# Patient Record
Sex: Male | Born: 1975 | Race: White | Hispanic: No | Marital: Single | Smoking: Current every day smoker
Health system: Southern US, Community
[De-identification: ages and names within clinical notes are randomized; demographics above are authoritative.]

## PROBLEM LIST (undated history)

## (undated) DIAGNOSIS — G5602 Carpal tunnel syndrome, left upper limb: Secondary | ICD-10-CM

## (undated) DIAGNOSIS — R569 Unspecified convulsions: Secondary | ICD-10-CM

## (undated) DIAGNOSIS — G35 Multiple sclerosis: Secondary | ICD-10-CM

## (undated) DIAGNOSIS — F209 Schizophrenia, unspecified: Secondary | ICD-10-CM

## (undated) HISTORY — DX: Schizophrenia, unspecified: F20.9

## (undated) HISTORY — DX: Carpal tunnel syndrome, left upper limb: G56.02

## (undated) HISTORY — PX: OTHER SURGICAL HISTORY: SHX169

## (undated) HISTORY — PX: LUNG BIOPSY: SHX232

## (undated) HISTORY — DX: Unspecified convulsions: R56.9

---

## 1998-08-14 ENCOUNTER — Inpatient Hospital Stay (HOSPITAL_COMMUNITY): Admission: AD | Admit: 1998-08-14 | Discharge: 1998-08-16 | Payer: Self-pay | Admitting: *Deleted

## 2000-03-20 ENCOUNTER — Emergency Department (HOSPITAL_COMMUNITY): Admission: EM | Admit: 2000-03-20 | Discharge: 2000-03-20 | Payer: Self-pay | Admitting: Emergency Medicine

## 2000-04-23 ENCOUNTER — Emergency Department (HOSPITAL_COMMUNITY): Admission: EM | Admit: 2000-04-23 | Discharge: 2000-04-23 | Payer: Self-pay | Admitting: Emergency Medicine

## 2007-10-23 ENCOUNTER — Emergency Department (HOSPITAL_COMMUNITY): Admission: EM | Admit: 2007-10-23 | Discharge: 2007-10-24 | Payer: Self-pay | Admitting: Emergency Medicine

## 2007-11-06 ENCOUNTER — Emergency Department (HOSPITAL_COMMUNITY): Admission: EM | Admit: 2007-11-06 | Discharge: 2007-11-06 | Payer: Self-pay | Admitting: Emergency Medicine

## 2009-12-04 ENCOUNTER — Emergency Department (HOSPITAL_COMMUNITY): Admission: EM | Admit: 2009-12-04 | Discharge: 2009-12-04 | Payer: Self-pay | Admitting: Emergency Medicine

## 2010-10-15 ENCOUNTER — Emergency Department (HOSPITAL_COMMUNITY): Payer: Medicaid Other

## 2010-10-15 ENCOUNTER — Emergency Department (HOSPITAL_COMMUNITY)
Admission: EM | Admit: 2010-10-15 | Discharge: 2010-10-15 | Disposition: A | Discharge status: Home / Self Care | Payer: Medicaid Other | Attending: Emergency Medicine | Admitting: Emergency Medicine

## 2010-10-15 DIAGNOSIS — F17200 Nicotine dependence, unspecified, uncomplicated: Secondary | ICD-10-CM | POA: Insufficient documentation

## 2010-10-15 DIAGNOSIS — R569 Unspecified convulsions: Secondary | ICD-10-CM | POA: Insufficient documentation

## 2010-10-15 LAB — POCT I-STAT, CHEM 8
BUN: 4 mg/dL — ABNORMAL LOW (ref 6–23)
Chloride: 103 mEq/L (ref 96–112)
Creatinine, Ser: 0.8 mg/dL (ref 0.4–1.5)
Hemoglobin: 14.6 g/dL (ref 13.0–17.0)
Potassium: 3.9 mEq/L (ref 3.5–5.1)
Sodium: 139 mEq/L (ref 135–145)

## 2010-12-01 LAB — POCT I-STAT, CHEM 8
BUN: 10 mg/dL (ref 6–23)
Chloride: 105 mEq/L (ref 96–112)
Creatinine, Ser: 0.7 mg/dL (ref 0.4–1.5)
Hemoglobin: 16.3 g/dL (ref 13.0–17.0)
Potassium: 4.1 mEq/L (ref 3.5–5.1)
Sodium: 139 mEq/L (ref 135–145)

## 2011-05-30 LAB — CBC
HCT: 44.8
Hemoglobin: 15.6
MCHC: 34.8
MCHC: 34.9
MCV: 91.6
Platelets: 284
RBC: 4.89
RDW: 12.8

## 2011-05-30 LAB — DIFFERENTIAL
Basophils Absolute: 0.1
Basophils Relative: 1
Basophils Relative: 1
Eosinophils Absolute: 0.2
Eosinophils Relative: 0
Eosinophils Relative: 1
Lymphocytes Relative: 11 — ABNORMAL LOW
Monocytes Absolute: 1
Monocytes Relative: 7
Neutro Abs: 17.1 — ABNORMAL HIGH

## 2011-05-30 LAB — RAPID URINE DRUG SCREEN, HOSP PERFORMED
Amphetamines: NOT DETECTED
Barbiturates: NOT DETECTED
Opiates: NOT DETECTED

## 2011-05-30 LAB — BASIC METABOLIC PANEL
BUN: 13
CO2: 28
Calcium: 9.1
Chloride: 103
Creatinine, Ser: 0.94
GFR calc Af Amer: >60
GFR calc non Af Amer: >60
Glucose, Bld: 114 — ABNORMAL HIGH
Sodium: 137

## 2011-05-30 LAB — URINALYSIS, ROUTINE W REFLEX MICROSCOPIC
Hgb urine dipstick: NEGATIVE
Specific Gravity, Urine: 1.036 — ABNORMAL HIGH
Urobilinogen, UA: 0.2

## 2013-04-15 ENCOUNTER — Ambulatory Visit: Payer: Self-pay | Admitting: Nurse Practitioner

## 2013-05-05 ENCOUNTER — Encounter: Payer: Self-pay | Admitting: Nurse Practitioner

## 2013-05-06 ENCOUNTER — Ambulatory Visit (INDEPENDENT_AMBULATORY_CARE_PROVIDER_SITE_OTHER): Payer: Medicaid Other | Admitting: Nurse Practitioner

## 2013-05-06 ENCOUNTER — Encounter: Payer: Self-pay | Admitting: Nurse Practitioner

## 2013-05-06 VITALS — BP 94/60 | HR 59 | Ht 67.5 in | Wt 166.0 lb

## 2013-05-06 DIAGNOSIS — G40309 Generalized idiopathic epilepsy and epileptic syndromes, not intractable, without status epilepticus: Secondary | ICD-10-CM

## 2013-05-06 DIAGNOSIS — Z5181 Encounter for therapeutic drug level monitoring: Secondary | ICD-10-CM | POA: Insufficient documentation

## 2013-05-06 DIAGNOSIS — Z79899 Other long term (current) drug therapy: Secondary | ICD-10-CM

## 2013-05-06 MED ORDER — LAMOTRIGINE ER 50 MG PO TB24: 50.0000 mg | ORAL_TABLET | Freq: Two times a day (BID) | ORAL | Status: DC

## 2013-05-06 MED ORDER — DIVALPROEX SODIUM 500 MG PO DR TAB: 1500.0000 mg | DELAYED_RELEASE_TABLET | Freq: Every day | ORAL | Status: DC

## 2013-05-06 NOTE — Progress Notes (Signed)
I have read the note, and I agree with the clinical assessment and plan.  Peace Jost KEITH   

## 2013-05-06 NOTE — Progress Notes (Signed)
  Reason for visit followup for seizure disorder HPI: [name], 37 yr old  white male returns for followup with history of seizures. The pt was last seen 09/28/12.   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 The patient is on Depakote in part for psychiatric reasons, but this medication alone did not fully control his seizures. This patient has been on a low dose of Lamictal XR, taking 50 mg twice daily. The patient has tolerated the medication well, and he has had no seizures since being on the medication. The patient denies any other new medical issues that come up since last seen. He currently does not have a primary care   ROS:  14 system review is negative   Medications Current Outpatient Prescriptions on File Prior to Visit  Medication Sig Dispense Refill  . benztropine (COGENTIN) 1 MG tablet Take 1 mg by mouth 2 (two) times daily.      . clonazePAM (KLONOPIN) 1 MG tablet Take 1 mg by mouth at bedtime.      . divalproex (DEPAKOTE) 500 MG DR tablet Take 500 mg by mouth 3 (three) times daily.      . LamoTRIgine (LAMICTAL XR) 50 MG TB24 Take 50 mg by mouth 2 (two) times daily.      . risperiDONE (RISPERDAL) 2 MG tablet Take 2 mg by mouth at bedtime.       No current facility-administered medications on file prior to visit.    Allergies No Known Allergies  Physical Exam General: well developed, well nourished, seated, in no evident distress Head: head normocephalic and atraumatic. Oropharynx benign Neck: supple with no carotid  bruits Cardiovascular: regular rate and rhythm, no murmurs  Neurologic Exam Mental Status: Awake and fully alert. Oriented to place and time. Follows all commands. Speech and language normal. Answers yes and no to  Questions. Affect flat  Cranial Nerves:  Pupils equal, briskly reactive to light. Extraocular movements full without nystagmus. Visual fields full to confrontation. Hearing intact and symmetric to finger snap.  Facial sensation intact. Face, tongue, palate move normally and symmetrically. Neck flexion and extension normal.  Motor: Normal bulk and tone. Normal strength in all tested extremity muscles.No focal weakness Sensory.: intact to touch and pinprick and vibratory.  Coordination: Rapid alternating movements normal in all extremities. Finger-to-nose and heel-to-shin performed accurately bilaterally. Gait and Station: Arises from chair without difficulty. Stance is normal. Gait demonstrates normal stride length and balance . Able to heel, toe and tandem walk without difficulty.  Reflexes: 2+ and symmetric. Toes downgoing.     ASSESSMENT: History of seizure disorder doing well on Depakote and Lamictal     PLAN: Continue Depakote  500 (3) tabs daily Continue Lamictal 50 mg XR  one twice daily Will obtain lab work today Followup yearly and prn. Micheal Webb, GNP-BC APRN

## 2013-05-06 NOTE — Patient Instructions (Addendum)
Continue Depakote which 500 (3) tabs daily Continue Lamictal 50 mg XR  one twice daily Will obtain lab work today Followup yearly and prn.

## 2013-05-08 LAB — COMPREHENSIVE METABOLIC PANEL
Albumin: 4.2 g/dL (ref 3.5–5.5)
BUN/Creatinine Ratio: 8 (ref 8–19)
BUN: 6 mg/dL (ref 6–20)
CO2: 22 mmol/L (ref 18–29)
Calcium: 9.6 mg/dL (ref 8.7–10.2)
Chloride: 104 mmol/L (ref 97–108)
Creatinine, Ser: 0.76 mg/dL (ref 0.76–1.27)
Globulin, Total: 2.3 g/dL (ref 1.5–4.5)
Glucose: 84 mg/dL (ref 65–99)
Total Protein: 6.5 g/dL (ref 6.0–8.5)

## 2013-05-08 LAB — CBC WITH DIFFERENTIAL/PLATELET
Eos: 2 % (ref 0–5)
HCT: 45.3 % (ref 37.5–51.0)
Hemoglobin: 16.1 g/dL (ref 12.6–17.7)
Immature Grans (Abs): 0 10*3/uL (ref 0.0–0.1)
Immature Granulocytes: 0 % (ref 0–2)
Lymphocytes Absolute: 5.1 10*3/uL — ABNORMAL HIGH (ref 0.7–3.1)
Lymphs: 44 % (ref 14–46)
Monocytes: 6 % (ref 4–12)
RDW: 13.5 % (ref 12.3–15.4)
WBC: 11.5 10*3/uL — ABNORMAL HIGH (ref 3.4–10.8)

## 2013-05-08 LAB — LAMOTRIGINE LEVEL: Lamotrigine Lvl: 3.3 ug/mL (ref 2.0–20.0)

## 2013-05-08 LAB — VALPROIC ACID LEVEL: Valproic Acid Lvl: 34 ug/mL — ABNORMAL LOW (ref 50–100)

## 2013-05-10 ENCOUNTER — Other Ambulatory Visit: Payer: Self-pay | Admitting: Nurse Practitioner

## 2013-05-10 DIAGNOSIS — Z79899 Other long term (current) drug therapy: Secondary | ICD-10-CM

## 2013-05-10 MED ORDER — DIVALPROEX SODIUM 500 MG PO DR TAB: 1000.0000 mg | DELAYED_RELEASE_TABLET | Freq: Two times a day (BID) | ORAL | Status: DC

## 2013-05-13 ENCOUNTER — Telehealth: Payer: Self-pay | Admitting: Nurse Practitioner

## 2013-05-13 NOTE — Telephone Encounter (Signed)
Returned call. No answer.  

## 2013-05-17 NOTE — Progress Notes (Signed)
Quick Note:  Left message for patient that Depakote level was low and to increase to 4 tabs daily, per Eber Jones. Also come and have Depakote level drawn in 10 days. Told to call office with any questions. ______

## 2013-05-25 ENCOUNTER — Telehealth: Payer: Self-pay | Admitting: Nurse Practitioner

## 2013-05-25 NOTE — Telephone Encounter (Signed)
Left vm on home phone for pt's mother: Reminder that pt's Depakote levels have to be checked because of increase in medication.  Lab order has already been placed.

## 2013-05-25 NOTE — Telephone Encounter (Signed)
Patients Depakote was increased after last visit. Repeat level needs to be done. Please call Mom to let her know

## 2013-05-31 ENCOUNTER — Other Ambulatory Visit (INDEPENDENT_AMBULATORY_CARE_PROVIDER_SITE_OTHER): Payer: Self-pay

## 2013-05-31 ENCOUNTER — Other Ambulatory Visit: Payer: Self-pay | Admitting: Nurse Practitioner

## 2013-05-31 DIAGNOSIS — Z0289 Encounter for other administrative examinations: Secondary | ICD-10-CM

## 2013-06-01 LAB — VALPROIC ACID LEVEL: Valproic Acid Lvl: 79 ug/mL (ref 50–100)

## 2013-06-02 ENCOUNTER — Telehealth: Payer: Self-pay

## 2013-06-02 NOTE — Telephone Encounter (Signed)
I called and left VM that valproate levels within range. Continue taking same dose.

## 2013-09-15 ENCOUNTER — Telehealth: Payer: Self-pay | Admitting: Neurology

## 2013-09-15 NOTE — Telephone Encounter (Signed)
Pt's mother called stating they just went to refill Pt's LamoTRIgine (LAMICTAL XR) 50 MG TB24 and was told by the Pharmacy that a pre-authorization is required for this medication in order for medicaid to pay for it.  Also stated that form has been previously been sent to [place].  Please contact [name] and if she is unavailable she stated you can leave a message on her voice mail.  Thank you.

## 2014-05-05 ENCOUNTER — Ambulatory Visit (INDEPENDENT_AMBULATORY_CARE_PROVIDER_SITE_OTHER): Payer: Medicaid Other | Admitting: Nurse Practitioner

## 2014-05-05 ENCOUNTER — Encounter: Payer: Self-pay | Admitting: Nurse Practitioner

## 2014-05-05 VITALS — BP 104/70 | HR 64 | Ht 67.5 in | Wt 158.0 lb

## 2014-05-05 DIAGNOSIS — Z79899 Other long term (current) drug therapy: Secondary | ICD-10-CM

## 2014-05-05 DIAGNOSIS — G40309 Generalized idiopathic epilepsy and epileptic syndromes, not intractable, without status epilepticus: Secondary | ICD-10-CM

## 2014-05-05 LAB — CBC WITH DIFFERENTIAL
BASOS ABS: 0 10*3/uL (ref 0.0–0.2)
Basos: 0 %
EOS ABS: 0.2 10*3/uL (ref 0.0–0.4)
Eos: 2 %
HEMATOCRIT: 47.3 % (ref 37.5–51.0)
Hemoglobin: 17 g/dL (ref 12.6–17.7)
LYMPHS ABS: 4.1 10*3/uL — AB (ref 0.7–3.1)
Lymphs: 33 %
MCH: 32.3 pg (ref 26.6–33.0)
MCHC: 35.9 g/dL — AB (ref 31.5–35.7)
MCV: 90 fL (ref 79–97)
MONOCYTES: 6 %
Monocytes Absolute: 0.7 10*3/uL (ref 0.1–0.9)
NEUTROS ABS: 7.5 10*3/uL — AB (ref 1.4–7.0)
Neutrophils Relative %: 59 %
Platelets: 201 10*3/uL (ref 150–379)
RBC: 5.26 x10E6/uL (ref 4.14–5.80)
RDW: 14.1 % (ref 12.3–15.4)
WBC: 12.6 10*3/uL — ABNORMAL HIGH (ref 3.4–10.8)

## 2014-05-05 LAB — COMPREHENSIVE METABOLIC PANEL
A/G RATIO: 1.4 (ref 1.1–2.5)
ALT: 10 IU/L (ref 0–44)
AST: 13 IU/L (ref 0–40)
Albumin: 4.3 g/dL (ref 3.5–5.5)
Alkaline Phosphatase: 71 IU/L (ref 39–117)
BUN/Creatinine Ratio: 8 — ABNORMAL LOW (ref 9–20)
BUN: 6 mg/dL (ref 6–24)
CALCIUM: 9.4 mg/dL (ref 8.7–10.2)
CO2: 26 mmol/L (ref 18–29)
Chloride: 103 mmol/L (ref 96–108)
Creatinine, Ser: 0.77 mg/dL (ref 0.76–1.27)
GFR calc Af Amer: 131 mL/min/{1.73_m2} (ref >59)
GFR, EST NON AFRICAN AMERICAN: 114 mL/min/{1.73_m2} (ref >59)
Globulin, Total: 3 g/dL (ref 1.5–4.5)
Glucose: 75 mg/dL (ref 65–99)
POTASSIUM: 4.2 mmol/L (ref 3.5–5.2)
SODIUM: 139 mmol/L (ref 134–144)
Total Bilirubin: 0.3 mg/dL (ref 0.0–1.2)
Total Protein: 7.3 g/dL (ref 6.0–8.5)

## 2014-05-05 LAB — VALPROIC ACID LEVEL: Valproic Acid Lvl: 48 ug/mL — ABNORMAL LOW (ref 50–100)

## 2014-05-05 NOTE — Patient Instructions (Signed)
Continue Lamictal at current dose, will refill Continue Depakote at current dose will refill Labs today CBC, CMP and VPA level Followup yearly and when necessary

## 2014-05-05 NOTE — Progress Notes (Signed)
[facility]  PATIENT: Micheal Webb DOB: 1976-01-01   REASON FOR VISIT: follow up for seizure  HISTORY OF PRESENT ILLNESS:Micheal Webb, 38 yr old white male returns for followup with history of seizures. The pt was last seen 05/06/13.  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 The patient is on Depakote in part for psychiatric reasons, but this medication alone did not fully control his seizures. This patient has been on a low dose of Lamictal XR, taking 50 mg twice daily. The patient has tolerated the medication well, and he has had no seizures since being on the medication. The patient denies any other new medical issues that come up since last seen. He returns for reevaluation.     REVIEW OF SYSTEMS: Full 14 system review of systems performed and notable only for those listed, all others are neg:  Constitutional: N/A  Cardiovascular: N/A  Ear/Nose/Throat: N/A  Skin: N/A  Eyes: N/A  Respiratory: Occasional wheezing Gastroitestinal: N/A  Hematology/Lymphatic: N/A  Endocrine: N/A Musculoskeletal:N/A  Allergy/Immunology: N/A  Neurological: N/A Psychiatric: N/A Sleep : NA   ALLERGIES: No Known Allergies  HOME MEDICATIONS: Outpatient Prescriptions Prior to Visit  Medication Sig Dispense Refill  . benztropine (COGENTIN) 1 MG tablet Take 1 mg by mouth. 1 TAB IN THE MORNING 2 TABS AT BEDTIME      . clonazePAM (KLONOPIN) 1 MG tablet Take 1 mg by mouth at bedtime.      . LamoTRIgine (LAMICTAL XR) 50 MG TB24 Take 1 tablet (50 mg total) by mouth 2 (two) times daily.  60 tablet  11  . risperiDONE (RISPERDAL) 2 MG tablet Take 2 mg by mouth at bedtime.      . divalproex (DEPAKOTE) 500 MG DR tablet Take 2 tablets (1,000 mg total) by mouth 2 (two) times daily.  120 tablet  11   No facility-administered medications prior to visit.    PAST MEDICAL HISTORY: Past Medical History  Diagnosis Date  . Seizure   . Schizophrenia      PAST SURGICAL HISTORY: Past Surgical History  Procedure Laterality Date  . None      FAMILY HISTORY: Family History  Problem Relation Age of Onset  . Breast cancer Mother   . Colon cancer Paternal Grandfather   . Pancreatic cancer Maternal Grandmother     SOCIAL HISTORY: History   Social History  . Marital Status: Single    Spouse Name: N/A    Number of Children: N/A  . Years of Education: N/A   Occupational History  . Not on file.   Social History Main Topics  . Smoking status: Current Every Day Smoker -- 1.00 packs/day    Types: Cigarettes  . Smokeless tobacco: Never Used  . Alcohol Use: Yes     Comment: occasion  . Drug Use: No     Comment: Denies  . Sexual Activity: Not on file   Other Topics Concern  . Not on file   Social History Narrative   Patient lives alone.    Patient is on disability.   Patient is single.            PHYSICAL EXAM  Filed Vitals:   05/05/14 1429  BP: 104/70  Pulse: 64  Height: 5' 7.5" (1.715 m)  Weight: 158 lb (71.668 kg)   Body mass index is 24.37 kg/(m^2). General: well developed, well nourished, seated, in no evident distress  Head: head normocephalic and atraumatic. Oropharynx benign  Neck: supple with no carotid bruits  Cardiovascular: regular rate and rhythm, no murmurs  Neurologic Exam  Mental Status: Awake and fully alert. Oriented to place and time. Follows all commands. Speech and language normal.  Affect flat  Cranial Nerves: Pupils equal, briskly reactive to light. Extraocular movements full without nystagmus. Visual fields full to confrontation. Hearing intact and symmetric to finger snap. Facial sensation intact. Face, tongue, palate move normally and symmetrically. Neck flexion and extension normal.  Motor: Normal bulk and tone. Normal strength in all tested extremity muscles.No focal weakness  Sensory.: intact to touch and pinprick and vibratory.  Coordination: Rapid alternating movements normal in all  extremities. Finger-to-nose and heel-to-shin performed accurately bilaterally.  Gait and Station: Arises from chair without difficulty. Stance is normal. Gait demonstrates normal stride length and balance . Able to heel, toe and tandem walk without difficulty.  Reflexes: 2+ and symmetric. Toes downgoing.    DIAGNOSTIC DATA (LABS, IMAGING, TESTING) - ASSESSMENT AND PLAN  38 y.o. year old male  has a past medical history of Seizure and Schizophrenia. here to followup. He has not had seizure activity in several years. He is currently on Depakote and Lamictal without side effects  Continue Lamictal at current dose, will refill Continue Depakote at current dose will refill after labs are back Labs today CBC, CMP and VPA level Followup yearly and when necessary [name], [facility], [facility], APRN  [facility] [address] [place], [place] [address] [phone]

## 2014-05-07 NOTE — Progress Notes (Signed)
I have read the note, and I agree with the clinical assessment and plan.  Micheal Webb KEITH   

## 2014-06-13 ENCOUNTER — Other Ambulatory Visit: Payer: Self-pay | Admitting: Nurse Practitioner

## 2014-06-13 NOTE — Telephone Encounter (Signed)
Patient's mother calling to get patient's Lamictal script renewed, please return call and advise, she is hoping to pick up the medication today.

## 2014-07-03 ENCOUNTER — Emergency Department (HOSPITAL_COMMUNITY): Payer: Medicaid Other

## 2014-07-03 ENCOUNTER — Encounter (HOSPITAL_COMMUNITY): Payer: Self-pay | Admitting: Emergency Medicine

## 2014-07-03 ENCOUNTER — Emergency Department (HOSPITAL_COMMUNITY)
Admission: EM | Admit: 2014-07-03 | Discharge: 2014-07-03 | Disposition: A | Discharge status: Home / Self Care | Payer: Medicaid Other | Attending: Emergency Medicine | Admitting: Emergency Medicine

## 2014-07-03 DIAGNOSIS — Z720 Tobacco use: Secondary | ICD-10-CM | POA: Diagnosis not present

## 2014-07-03 DIAGNOSIS — G40909 Epilepsy, unspecified, not intractable, without status epilepticus: Secondary | ICD-10-CM | POA: Insufficient documentation

## 2014-07-03 DIAGNOSIS — Z7952 Long term (current) use of systemic steroids: Secondary | ICD-10-CM | POA: Insufficient documentation

## 2014-07-03 DIAGNOSIS — Z79899 Other long term (current) drug therapy: Secondary | ICD-10-CM | POA: Diagnosis not present

## 2014-07-03 DIAGNOSIS — J209 Acute bronchitis, unspecified: Secondary | ICD-10-CM | POA: Insufficient documentation

## 2014-07-03 DIAGNOSIS — R091 Pleurisy: Secondary | ICD-10-CM | POA: Insufficient documentation

## 2014-07-03 DIAGNOSIS — Z792 Long term (current) use of antibiotics: Secondary | ICD-10-CM | POA: Insufficient documentation

## 2014-07-03 DIAGNOSIS — F209 Schizophrenia, unspecified: Secondary | ICD-10-CM | POA: Diagnosis not present

## 2014-07-03 DIAGNOSIS — R0781 Pleurodynia: Secondary | ICD-10-CM | POA: Diagnosis present

## 2014-07-03 LAB — CBC WITH DIFFERENTIAL/PLATELET
Basophils Absolute: 0.1 10*3/uL (ref 0.0–0.1)
Basophils Relative: 1 % (ref 0–1)
EOS ABS: 0.3 10*3/uL (ref 0.0–0.7)
Eosinophils Relative: 2 % (ref 0–5)
HEMATOCRIT: 43.8 % (ref 39.0–52.0)
Hemoglobin: 15.4 g/dL (ref 13.0–17.0)
Lymphocytes Relative: 42 % (ref 12–46)
Lymphs Abs: 4.6 10*3/uL — ABNORMAL HIGH (ref 0.7–4.0)
MCH: 31.7 pg (ref 26.0–34.0)
MCHC: 35.2 g/dL (ref 30.0–36.0)
MCV: 90.1 fL (ref 78.0–100.0)
MONOS PCT: 7 % (ref 3–12)
Monocytes Absolute: 0.8 10*3/uL (ref 0.1–1.0)
Neutro Abs: 5.3 10*3/uL (ref 1.7–7.7)
Neutrophils Relative %: 48 % (ref 43–77)
Platelets: 207 10*3/uL (ref 150–400)
RBC: 4.86 MIL/uL (ref 4.22–5.81)
RDW: 12.7 % (ref 11.5–15.5)
WBC: 11 10*3/uL — ABNORMAL HIGH (ref 4.0–10.5)

## 2014-07-03 LAB — COMPREHENSIVE METABOLIC PANEL
ALT: 9 U/L (ref 0–53)
AST: 12 U/L (ref 0–37)
Albumin: 3.4 g/dL — ABNORMAL LOW (ref 3.5–5.2)
Alkaline Phosphatase: 72 U/L (ref 39–117)
Anion gap: 13 (ref 5–15)
BUN: 6 mg/dL (ref 6–23)
CO2: 24 mEq/L (ref 19–32)
Calcium: 8.8 mg/dL (ref 8.4–10.5)
Chloride: 101 mEq/L (ref 96–112)
Creatinine, Ser: 0.87 mg/dL (ref 0.50–1.35)
GFR calc Af Amer: >90 mL/min (ref >90)
GFR calc non Af Amer: >90 mL/min (ref >90)
Glucose, Bld: 97 mg/dL (ref 70–99)
Potassium: 3.2 mEq/L — ABNORMAL LOW (ref 3.7–5.3)
SODIUM: 138 meq/L (ref 137–147)
TOTAL PROTEIN: 6.8 g/dL (ref 6.0–8.3)
Total Bilirubin: 0.3 mg/dL (ref 0.3–1.2)

## 2014-07-03 LAB — I-STAT TROPONIN, ED: Troponin i, poc: 0 ng/mL (ref 0.00–0.08)

## 2014-07-03 LAB — LIPASE, BLOOD: Lipase: 15 U/L (ref 11–59)

## 2014-07-03 LAB — D-DIMER, QUANTITATIVE: D-Dimer, Quant: 0.32 ug/mL-FEU (ref 0.00–0.48)

## 2014-07-03 MED ORDER — AZITHROMYCIN 250 MG PO TABS: ORAL_TABLET | ORAL | Status: DC

## 2014-07-03 MED ORDER — PREDNISONE 20 MG PO TABS: 20.0000 mg | ORAL_TABLET | Freq: Two times a day (BID) | ORAL | Status: DC

## 2014-07-03 NOTE — ED Notes (Signed)
Patient transported to X-ray 

## 2014-07-03 NOTE — ED Notes (Signed)
Pt asked to attempt to void, pt states "I don't have any [urine]." Pt informed only a few milliliters are needed, pt continues to state he is unable to void.

## 2014-07-03 NOTE — ED Provider Notes (Signed)
CSN: [id number]     Arrival date & time 07/03/14  1039 History   First MD Initiated Contact with Patient 07/03/14 1053     Chief Complaint  Patient presents with  . Abdominal Pain  . Rib Pain      (Consider location/radiation/quality/duration/timing/severity/associated sxs/prior Treatment) HPI Micheal Webb is a 38 y.o. male who complains of right sided chest discomfort, with breathing, and movement, which started suddenly at 6 AM today. He states that he was up all night, as usual, and then noticed the pain. He denies fever, chills, shortness of breath, cough, weakness or dizziness. He smokes cigarettes. He tried his albuterol inhaler, this morning, without relief. He is taking his usual medications. There are no other known modifying factors.   Past Medical History  Diagnosis Date  . Seizure   . Schizophrenia    Past Surgical History  Procedure Laterality Date  . None     Family History  Problem Relation Age of Onset  . Breast cancer Mother   . Colon cancer Paternal Grandfather   . Pancreatic cancer Maternal Grandmother    History  Substance Use Topics  . Smoking status: Current Every Day Smoker -- 1.00 packs/day    Types: Cigarettes  . Smokeless tobacco: Never Used  . Alcohol Use: Yes     Comment: occasion    Review of Systems  All other systems reviewed and are negative.     Allergies  Review of patient's allergies indicates no known allergies.  Home Medications   Prior to Admission medications   Medication Sig Start Date End Date Taking? Authorizing Provider  benztropine (COGENTIN) 1 MG tablet Take 1 mg by mouth. 1 TAB IN THE MORNING 2 TABS AT BEDTIME   Yes Historical Provider, MD  clonazePAM (KLONOPIN) 1 MG tablet Take 1 mg by mouth at bedtime.   Yes Historical Provider, MD  divalproex (DEPAKOTE) 500 MG DR tablet Take 1,500 mg by mouth at bedtime.  05/10/13  Yes Nilda RiggsNancy Carolyn Martin, NP  lamoTRIgine (LAMICTAL) 25 MG tablet Take 50 mg by mouth 2 (two)  times daily.   Yes Historical Provider, MD  risperiDONE (RISPERDAL) 2 MG tablet Take 2 mg by mouth 2 (two) times daily.    Yes Historical Provider, MD  azithromycin (ZITHROMAX Z-PAK) 250 MG tablet 2 po day one, then 1 daily x 4 days 07/03/14   Flint MelterElliott L Greenleigh Kauth, MD  predniSONE (DELTASONE) 20 MG tablet Take 1 tablet (20 mg total) by mouth 2 (two) times daily. 07/03/14   Flint MelterElliott L Jamelah Sitzer, MD   BP 105/83  Pulse 67  Temp(Src) 98.4 F (36.9 C) (Oral)  Resp 18  Ht 5\' 7"  (1.702 m)  Wt 170 lb (77.111 kg)  BMI 26.62 kg/m2  SpO2 100% Physical Exam  Nursing note and vitals reviewed. Constitutional: He is oriented to person, place, and time. He appears well-developed and well-nourished.  HENT:  Head: Normocephalic and atraumatic.  Right Ear: External ear normal.  Left Ear: External ear normal.  Eyes: Conjunctivae and EOM are normal. Pupils are equal, round, and reactive to light.  Neck: Normal range of motion and phonation normal. Neck supple.  Cardiovascular: Normal rate, regular rhythm and normal heart sounds.   Pulmonary/Chest: Effort normal. No respiratory distress. He has no wheezes. He has rales (Right, greater than left.). He exhibits no tenderness and no bony tenderness.  Abdominal: Soft. There is no tenderness.  Musculoskeletal: Normal range of motion.  Neurological: He is alert and oriented to person, place,  and time. No cranial nerve deficit or sensory deficit. He exhibits normal muscle tone. Coordination normal.  Skin: Skin is warm, dry and intact.  Psychiatric: He has a normal mood and affect. His behavior is normal. Judgment and thought content normal.    ED Course  Procedures (including critical care time) Medications - No data to display  Patient Vitals for the past 24 hrs:  BP Temp Temp src Pulse Resp SpO2 Height Weight  07/03/14 1344 105/83 mmHg - - 67 18 100 % - -  07/03/14 1339 - - - 69 - 99 % - -  07/03/14 1330 - - - 58 - 96 % - -  07/03/14 1253 116/75 mmHg - - 65 16 99  % - -  07/03/14 1100 - - - 64 - 100 % - -  07/03/14 1041 117/79 mmHg 98.4 F (36.9 C) Oral 65 14 96 % 5\' 7"  (1.702 m) 170 lb (77.111 kg)  07/03/14 1040 117/79 mmHg - - 70 - 97 % - -    1:42 PM Reevaluation with update and discussion. After initial assessment and treatment, an updated evaluation reveals no further c/o. Findings discussed with patient, all questions answered. Lekendrick Alpern L   Labs Review Labs Reviewed  COMPREHENSIVE METABOLIC PANEL - Abnormal; Notable for the following:    Potassium 3.2 (*)    Albumin 3.4 (*)    All other components within normal limits  CBC WITH DIFFERENTIAL - Abnormal; Notable for the following:    WBC 11.0 (*)    Lymphs Abs 4.6 (*)    All other components within normal limits  LIPASE, BLOOD  D-DIMER, QUANTITATIVE  I-STAT TROPOININ, ED    Imaging Review Dg Chest 2 View  07/03/2014   CLINICAL DATA:  Pleurisy  EXAM: CHEST  2 VIEW  COMPARISON:  None.  FINDINGS: The heart size and mediastinal contours are within normal limits. Both lungs are clear. The visualized skeletal structures are unremarkable.  IMPRESSION: No active cardiopulmonary disease.   Electronically Signed   By: Marlan Palau M.D.   On: 07/03/2014 11:32     EKG Interpretation None      MDM   Final diagnoses:  Pleurisy  Acute bronchitis, unspecified organism     ICD-9-CM ICD-10-CM   1. Acute bronchitis, unspecified organism 466.0 J20.9   2. Pleurisy 511.0 R09.1 DG Chest 2 View     DG Chest 2 View     Bronchitis related to tobacco abuse; without evidence for pneumonia, metabolic instability, or impending vascular collapse.  Nursing Notes Reviewed/ Care Coordinated Applicable Imaging Reviewed Interpretation of Laboratory Data incorporated into ED treatment  The patient appears reasonably screened and/or stabilized for discharge and I doubt any other medical condition or other [facility] requiring further screening, evaluation, or treatment in the ED at this time prior to  discharge.  Plan: Home Medications- Zithromax, Prednisone; Home Treatments- rest, stop smoking; return here if the recommended treatment, does not improve the symptoms; Recommended follow up- PCP for follow up appt. In 1 week    Flint Melter, MD 07/03/14 [phone]

## 2014-07-03 NOTE — ED Notes (Signed)
Bed: WA15 Expected date:  Expected time:  Means of arrival:  Comments: EMS-flank pain 

## 2014-07-03 NOTE — Discharge Instructions (Signed)

## 2014-07-03 NOTE — ED Notes (Signed)
Pt aware that urine sample is needed. Pt continues to state that is unable to void at this time

## 2014-07-03 NOTE — ED Notes (Signed)
Pt asked to stand and attempt to use the urinal to provide urine specimen. Pt states "no." Pt informed that further progress in his care is impossible without urine sample. Pt states "I do not have to go." Pt told sternly to stand and attempt to provide urine, informed that only a small amount is needed. Pt states "no," continues to refuse to provide urine sample.

## 2014-07-03 NOTE — ED Notes (Addendum)
Pt with Hx of epilepsy and schizophrenia c/o right flank pain, worse on inspiration, onset 0600 this morning. Pt ambulatory.

## 2014-08-02 ENCOUNTER — Encounter: Payer: Self-pay | Admitting: Neurology

## 2015-05-07 ENCOUNTER — Ambulatory Visit (INDEPENDENT_AMBULATORY_CARE_PROVIDER_SITE_OTHER): Payer: Medicaid Other | Admitting: Nurse Practitioner

## 2015-05-07 ENCOUNTER — Ambulatory Visit: Payer: Medicaid Other | Admitting: Nurse Practitioner

## 2015-05-07 ENCOUNTER — Encounter: Payer: Self-pay | Admitting: Nurse Practitioner

## 2015-05-07 VITALS — BP 110/70 | HR 88 | Ht 67.0 in | Wt 153.6 lb

## 2015-05-07 DIAGNOSIS — G40309 Generalized idiopathic epilepsy and epileptic syndromes, not intractable, without status epilepticus: Secondary | ICD-10-CM

## 2015-05-07 DIAGNOSIS — Z5181 Encounter for therapeutic drug level monitoring: Secondary | ICD-10-CM | POA: Diagnosis not present

## 2015-05-07 MED ORDER — LAMOTRIGINE 25 MG PO TABS: 50.0000 mg | ORAL_TABLET | Freq: Two times a day (BID) | ORAL | Status: DC

## 2015-05-07 NOTE — Progress Notes (Signed)
I have read the note, and I agree with the clinical assessment and plan.  WILLIS,CHARLES KEITH   

## 2015-05-07 NOTE — Patient Instructions (Signed)
Continue Lamictal at current dose, will refill Continue Depakote at current dose will refill after labs are back Labs today CBC, CMP and VPA level Followup yearly and when necessary

## 2015-05-07 NOTE — Progress Notes (Signed)
[facility]  PATIENT: Micheal Webb DOB: Nov 10, 1975   REASON FOR VISIT: Follow-up for generalized seizure disorder HISTORY FROM: Patient, mother    HISTORY OF PRESENT ILLNESS:Mr. Encarnacion, 39 yr old white male returns for followup with history of seizures. The pt was last seen 05/05/14. 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 The patient is on Depakote in part for psychiatric reasons, but this medication alone did not fully control his seizures. This patient has been on a low dose of Lamictal, taking 50 mg twice daily. The patient has tolerated the medication well, and he has had no seizures since being on the medication. The patient denies any other new medical issues that come up since last seen. He returns for reevaluation. His schizophrenia is treated through psychiatry.   REVIEW OF SYSTEMS: Full 14 system review of systems performed and notable only for those listed, all others are neg:  Constitutional: neg  Cardiovascular: neg Ear/Nose/Throat: neg  Skin: neg Eyes: neg Respiratory: Wheezing Gastroitestinal: neg  Hematology/Lymphatic: neg  Endocrine: neg Musculoskeletal:neg Allergy/Immunology: neg Neurological: neg Psychiatric: neg Sleep : neg   ALLERGIES: No Known Allergies  HOME MEDICATIONS: Outpatient Prescriptions Prior to Visit  Medication Sig Dispense Refill  . benztropine (COGENTIN) 1 MG tablet Take 1 mg by mouth. 1 TAB IN THE MORNING 2 TABS AT BEDTIME    . clonazePAM (KLONOPIN) 1 MG tablet Take 1 mg by mouth at bedtime.    . divalproex (DEPAKOTE) 500 MG DR tablet Take 1,500 mg by mouth at bedtime.     . lamoTRIgine (LAMICTAL) 25 MG tablet Take 50 mg by mouth 2 (two) times daily.    . risperiDONE (RISPERDAL) 2 MG tablet Take 2 mg by mouth 2 (two) times daily.     [name] azithromycin (ZITHROMAX Z-PAK) 250 MG tablet 2 po day one, then 1 daily x 4 days (Patient not taking: Reported on 05/07/2015) 5 tablet 0    . predniSONE (DELTASONE) 20 MG tablet Take 1 tablet (20 mg total) by mouth 2 (two) times daily. (Patient not taking: Reported on 05/07/2015) 10 tablet 0   No facility-administered medications prior to visit.    PAST MEDICAL HISTORY: Past Medical History  Diagnosis Date  . Seizure   . Schizophrenia     PAST SURGICAL HISTORY: Past Surgical History  Procedure Laterality Date  . None      FAMILY HISTORY: Family History  Problem Relation Age of Onset  . Breast cancer Mother   . Colon cancer Paternal Grandfather   . Pancreatic cancer Maternal Grandmother     SOCIAL HISTORY: Social History   Social History  . Marital Status: Single    Spouse Name: N/A  . Number of Children: N/A  . Years of Education: N/A   Occupational History  . Not on file.   Social History Main Topics  . Smoking status: Current Every Day Smoker -- 1.00 packs/day    Types: Cigarettes  . Smokeless tobacco: Never Used  . Alcohol Use: Yes     Comment: occasion  . Drug Use: No     Comment: Denies  . Sexual Activity: Not on file   Other Topics Concern  . Not on file   Social History Narrative   Patient lives alone.    Patient is on disability.   Patient is single.            PHYSICAL EXAM  Filed Vitals:   05/07/15 1537  BP: 110/70  Pulse: 88  Height: 5\' 7"  (1.702 m)  Weight: 153 lb 9.6 oz (69.673 kg)   Body mass index is 24.05 kg/(m^2). General: well developed, well nourished, seated, in no evident distress  Head: head normocephalic and atraumatic. Oropharynx benign  Neck: supple   Neurologic Exam  Mental Status: Awake and fully alert. Oriented to place and time. Follows all commands. Speech and language normal. Affect flat  Cranial Nerves: Pupils equal, briskly reactive to light. Extraocular movements full without nystagmus. Visual fields full to confrontation. Hearing intact and symmetric to finger snap. Facial sensation intact. Face, tongue, palate move normally and  symmetrically. Neck flexion and extension normal.  Motor: Normal bulk and tone. Normal strength in all tested extremity muscles.No focal weakness  Coordination: Rapid alternating movements normal in all extremities. Finger-to-nose and heel-to-shin performed accurately bilaterally.  Gait and Station: Arises from chair without difficulty. Stance is normal. Gait demonstrates normal stride length and balance . Able to heel, toe and tandem walk without difficulty.  Reflexes: 2+ and symmetric. Toes downgoing.  DIAGNOSTIC DATA (LABS, IMAGING, TESTING) -  ASSESSMENT AND PLAN  39 y.o. year old male  has a past medical history of Seizure and Schizophrenia. here to follow up. No seizure activity in several years.  Continue Lamictal at current dose, will refill Continue Depakote at current dose refilled by psych Labs today CBC, CMP and VPA level Call for any seizure activity Followup yearly and when necessary [name], [facility], [facility], APRN  [facility] [address], [address] [place], [place] [address] [phone]

## 2015-05-08 LAB — VALPROIC ACID LEVEL: VALPROIC ACID LVL: 111 ug/mL — AB (ref 50–100)

## 2015-05-08 LAB — COMPREHENSIVE METABOLIC PANEL
ALK PHOS: 67 IU/L (ref 39–117)
ALT: 6 IU/L (ref 0–44)
AST: 10 IU/L (ref 0–40)
Albumin/Globulin Ratio: 1.7 (ref 1.1–2.5)
Albumin: 4.2 g/dL (ref 3.5–5.5)
BUN/Creatinine Ratio: 6 — ABNORMAL LOW (ref 9–20)
BUN: 5 mg/dL — AB (ref 6–24)
Bilirubin Total: 0.5 mg/dL (ref 0.0–1.2)
CALCIUM: 9.5 mg/dL (ref 8.7–10.2)
CO2: 25 mmol/L (ref 18–29)
CREATININE: 0.77 mg/dL (ref 0.76–1.27)
Chloride: 98 mmol/L (ref 97–108)
GFR calc Af Amer: 130 mL/min/{1.73_m2} (ref >59)
GFR calc non Af Amer: 113 mL/min/{1.73_m2} (ref >59)
GLOBULIN, TOTAL: 2.5 g/dL (ref 1.5–4.5)
GLUCOSE: 117 mg/dL — AB (ref 65–99)
Potassium: 3.9 mmol/L (ref 3.5–5.2)
SODIUM: 140 mmol/L (ref 134–144)
Total Protein: 6.7 g/dL (ref 6.0–8.5)

## 2015-05-08 LAB — LAMOTRIGINE LEVEL: Lamotrigine Lvl: 3.7 ug/mL (ref 2.0–20.0)

## 2015-05-08 LAB — CBC WITH DIFFERENTIAL/PLATELET
Basophils Absolute: 0 10*3/uL (ref 0.0–0.2)
Basos: 0 %
EOS (ABSOLUTE): 0.1 10*3/uL (ref 0.0–0.4)
EOS: 1 %
HEMATOCRIT: 45.3 % (ref 37.5–51.0)
Hemoglobin: 16.5 g/dL (ref 12.6–17.7)
IMMATURE GRANS (ABS): 0 10*3/uL (ref 0.0–0.1)
IMMATURE GRANULOCYTES: 0 %
LYMPHS: 28 %
Lymphocytes Absolute: 3.4 10*3/uL — ABNORMAL HIGH (ref 0.7–3.1)
MCH: 32.5 pg (ref 26.6–33.0)
MCHC: 36.4 g/dL — ABNORMAL HIGH (ref 31.5–35.7)
MCV: 89 fL (ref 79–97)
MONOS ABS: 1 10*3/uL — AB (ref 0.1–0.9)
Monocytes: 8 %
NEUTROS PCT: 63 %
Neutrophils Absolute: 7.4 10*3/uL — ABNORMAL HIGH (ref 1.4–7.0)
Platelets: 219 10*3/uL (ref 150–379)
RBC: 5.07 x10E6/uL (ref 4.14–5.80)
RDW: 13.3 % (ref 12.3–15.4)
WBC: 12 10*3/uL — AB (ref 3.4–10.8)

## 2015-05-10 ENCOUNTER — Telehealth: Payer: Self-pay | Admitting: *Deleted

## 2015-05-10 NOTE — Progress Notes (Signed)
Quick Note:  I called and spoke to mother and relayed the results of the lab tests to her as per result note. She will relay to pt. ______

## 2015-05-10 NOTE — Telephone Encounter (Signed)
I called and spoke to mother.  Labs ok , continue on same dose of VPA and Lamictal.   She verbalized understanding.

## 2015-05-10 NOTE — Telephone Encounter (Signed)
-----   Message from [name], NP sent at 05/09/2015  4:53 PM EDT ----- Please let mom know labs ok. Continue same dose of VPA and Lamictal.

## 2016-05-06 ENCOUNTER — Ambulatory Visit (INDEPENDENT_AMBULATORY_CARE_PROVIDER_SITE_OTHER): Payer: Medicaid Other | Admitting: Nurse Practitioner

## 2016-05-06 ENCOUNTER — Encounter: Payer: Self-pay | Admitting: Nurse Practitioner

## 2016-05-06 VITALS — BP 113/73 | HR 78 | Ht 67.0 in | Wt 148.2 lb

## 2016-05-06 DIAGNOSIS — G40309 Generalized idiopathic epilepsy and epileptic syndromes, not intractable, without status epilepticus: Secondary | ICD-10-CM

## 2016-05-06 MED ORDER — LAMOTRIGINE 25 MG PO TABS: 50.0000 mg | ORAL_TABLET | Freq: Two times a day (BID) | ORAL | 3 refills | Status: DC

## 2016-05-06 NOTE — Progress Notes (Signed)
I have read the note, and I agree with the clinical assessment and plan.  Jester Klingberg KEITH   

## 2016-05-06 NOTE — Patient Instructions (Addendum)
Continue Lamictal at current dose, will refill Continue Depakote at current dose refilled by psych Labs just done by PCP Call for any seizure activity Followup yearly and when necessary

## 2016-05-06 NOTE — Progress Notes (Signed)
[facility]  PATIENT: Micheal Webb DOB: 1976-06-26   REASON FOR VISIT: Follow-up for generalized seizure disorder HISTORY FROM: Patient, mother    HISTORY OF PRESENT ILLNESS:Micheal Webb, 40 yr old white male returns for yearly followup with history of seizures.  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 The patient is on Depakote in part for psychiatric reasons, but this medication alone did not fully control his seizures. This patient has been on a low dose of Lamictal, taking 50 mg twice daily. The patient has tolerated the medication well, and he has had no seizures since being on the medication. The patient was recently taken off of his statin medication due to muscle cramps and leg weakness. He just had repeat labs yesterday.  His schizophrenia is treated through psychiatry.   REVIEW OF SYSTEMS: Full 14 system review of systems performed and notable only for those listed, all others are neg:  Constitutional: neg  Cardiovascular: neg Ear/Nose/Throat: neg  Skin: neg Eyes: neg Respiratory: Wheezing Gastroitestinal: neg  Hematology/Lymphatic: neg  Endocrine: neg Musculoskeletal: Muscle cramps Allergy/Immunology: neg Neurological: neg Psychiatric: neg Sleep : neg   ALLERGIES: No Known Allergies  HOME MEDICATIONS: Outpatient Medications Prior to Visit  Medication Sig Dispense Refill  . albuterol (PROVENTIL HFA;VENTOLIN HFA) 108 (90 BASE) MCG/ACT inhaler Inhale 2 puffs into the lungs as needed for wheezing or shortness of breath.    . benztropine (COGENTIN) 1 MG tablet Take 1 mg by mouth. 1 TAB IN THE MORNING 2 TABS AT BEDTIME    . budesonide-formoterol (SYMBICORT) 160-4.5 MCG/ACT inhaler Inhale 2 puffs into the lungs daily.    . clonazePAM (KLONOPIN) 1 MG tablet Take 1 mg by mouth at bedtime.    . divalproex (DEPAKOTE) 500 MG DR tablet Take 1,500 mg by mouth at bedtime.     . lamoTRIgine (LAMICTAL) 25 MG tablet  Take 2 tablets (50 mg total) by mouth 2 (two) times daily. 360 tablet 3  . risperiDONE (RISPERDAL) 2 MG tablet Take 2 mg by mouth 2 (two) times daily.      No facility-administered medications prior to visit.     PAST MEDICAL HISTORY: Past Medical History:  Diagnosis Date  . Schizophrenia (HCC)   . Seizure (HCC)     PAST SURGICAL HISTORY: Past Surgical History:  Procedure Laterality Date  . None      FAMILY HISTORY: Family History  Problem Relation Age of Onset  . Breast cancer Mother   . Diverticulitis Mother     had surgery and colostomy bag 11/2015  . Colon cancer Paternal Grandfather   . Pancreatic cancer Maternal Grandmother     SOCIAL HISTORY: Social History   Social History  . Marital status: Single    Spouse name: N/A  . Number of children: N/A  . Years of education: N/A   Occupational History  . Not on file.   Social History Main Topics  . Smoking status: Current Every Day Smoker    Packs/day: 1.00    Types: Cigarettes  . Smokeless tobacco: Never Used  . Alcohol use Yes     Comment: occasion  . Drug use: No     Comment: Denies  . Sexual activity: Not on file   Other Topics Concern  . Not on file   Social History Narrative   Patient lives alone.    Patient is on disability.   Patient is single.  PHYSICAL EXAM  Vitals:   05/06/16 1458  BP: 113/73  Pulse: 78  Weight: 148 lb 3.2 oz (67.2 kg)  Height: 5\' 7"  (1.702 m)   Body mass index is 23.21 kg/m. General: well developed, well nourished, seated, in no evident distress  Head: head normocephalic and atraumatic. Oropharynx benign  Neck: supple   Neurologic Exam  Mental Status: Awake and fully alert. Oriented to place and time. Follows all commands. Speech and language normal. Affect flat  Cranial Nerves: Pupils equal, briskly reactive to light. Extraocular movements full without nystagmus. Visual fields full to confrontation. Hearing intact and symmetric to finger snap.  Facial sensation intact. Face, tongue, palate move normally and symmetrically. Neck flexion and extension normal.  Motor: Normal bulk and tone. Normal strength in all tested extremity muscles.No focal weakness  Coordination: Rapid alternating movements normal in all extremities. Finger-to-nose and heel-to-shin performed accurately bilaterally.  Gait and Station: Arises from chair without difficulty. Stance is normal. Gait demonstrates normal stride length and balance . Able to heel, toe and tandem walk without difficulty.  Reflexes: 2+ and symmetric. Toes downgoing.  DIAGNOSTIC DATA (LABS, IMAGING, TESTING) -  ASSESSMENT AND PLAN  40 y.o. year old male  has a past medical history of Schizophrenia (HCC) and Seizure (HCC). here to follow up. No seizure activity in several years.  PLAN: Continue Lamictal at current dose, will refill Continue Depakote at current dose refilled by psych Labs just done by PCP Call for any seizure activity Followup yearly and when necessary [name], [facility], [facility], APRN  [facility] [address] [place], [place] [address] [phone]

## 2017-02-09 ENCOUNTER — Telehealth: Payer: Self-pay | Admitting: Nurse Practitioner

## 2017-02-09 MED ORDER — LAMOTRIGINE 25 MG PO TABS: 50.0000 mg | ORAL_TABLET | Freq: Two times a day (BID) | ORAL | 0 refills | Status: DC

## 2017-02-09 NOTE — Addendum Note (Signed)
Addended byHermenia Fiscal on: 02/09/2017 01:30 PM   Modules accepted: Orders

## 2017-02-09 NOTE — Telephone Encounter (Signed)
Spoke to mother that let her know.  She verbalized understanding.

## 2017-02-09 NOTE — Telephone Encounter (Signed)
Pt's mother called said he is temporally living with his dad until his apartment is ready in [place]. She said he misplaced his medications and needs a refill for lamoTRIgine (LAMICTAL) 25 MG tablet sent to [facility] [address] [place] [phone]. She said he needs a 30 day supply.

## 2017-05-05 NOTE — Progress Notes (Addendum)
[facility]  PATIENT: Micheal Webb DOB: 1976-01-02   REASON FOR VISIT: Follow-up for generalized seizure disorder HISTORY FROM: Patient, mother    HISTORY OF PRESENT ILLNESS:Micheal Webb, 41 yr old white male returns for yearly followup with history of seizures.  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 The patient is on Depakote in part for psychiatric reasons, but this medication alone did not fully control his seizures. This patient has been on a low dose of Lamictal, taking 50 mg twice daily. The patient has tolerated the medication well, and he has had no seizures since being on the medication.  Labs through PCP.   His schizophrenia is treated through psychiatry. He returns for reevaluation   REVIEW OF SYSTEMS: Full 14 system review of systems performed and notable only for those listed, all others are neg:  Constitutional: neg  Cardiovascular: neg Ear/Nose/Throat: neg  Skin: neg Eyes: neg Respiratory: neg Gastroitestinal: neg  Hematology/Lymphatic: neg  Endocrine: neg Musculoskeletal: neg Allergy/Immunology: neg Neurological: neg Psychiatric: neg Sleep : neg   ALLERGIES: No Known Allergies  HOME MEDICATIONS: Outpatient Medications Prior to Visit  Medication Sig Dispense Refill  . albuterol (PROVENTIL HFA;VENTOLIN HFA) 108 (90 BASE) MCG/ACT inhaler Inhale 2 puffs into the lungs as needed for wheezing or shortness of breath.    . benztropine (COGENTIN) 1 MG tablet Take 1 mg by mouth. 1 TAB IN THE MORNING 2 TABS AT BEDTIME    . budesonide-formoterol (SYMBICORT) 160-4.5 MCG/ACT inhaler Inhale 2 puffs into the lungs daily.    . clonazePAM (KLONOPIN) 1 MG tablet Take 1 mg by mouth at bedtime.    . divalproex (DEPAKOTE) 500 MG DR tablet Take 1,500 mg by mouth at bedtime.     . lamoTRIgine (LAMICTAL) 25 MG tablet Take 2 tablets (50 mg total) by mouth 2 (two) times daily. 120 tablet 0  . risperiDONE (RISPERDAL) 2  MG tablet Take 2 mg by mouth 2 (two) times daily.      No facility-administered medications prior to visit.     PAST MEDICAL HISTORY: Past Medical History:  Diagnosis Date  . Schizophrenia (HCC)   . Seizure (HCC)     PAST SURGICAL HISTORY: Past Surgical History:  Procedure Laterality Date  . None      FAMILY HISTORY: Family History  Problem Relation Age of Onset  . Breast cancer Mother   . Diverticulitis Mother        had surgery and colostomy bag 11/2015  . Colon cancer Paternal Grandfather   . Pancreatic cancer Maternal Grandmother     SOCIAL HISTORY: Social History   Social History  . Marital status: Single    Spouse name: N/A  . Number of children: N/A  . Years of education: N/A   Occupational History  . Not on file.   Social History Main Topics  . Smoking status: Current Every Day Smoker    Packs/day: 1.00    Types: Cigarettes  . Smokeless tobacco: Never Used  . Alcohol use Yes     Comment: occasion  . Drug use: No     Comment: Denies  . Sexual activity: Not on file   Other Topics Concern  . Not on file   Social History Narrative   Patient lives alone.    Patient is on disability.   Patient is single.            PHYSICAL EXAM  Vitals:   05/06/17 1331  BP:  99/70  Pulse: 77  Weight: 144 lb 9.6 oz (65.6 kg)  Height: 5\' 7"  (1.702 m)   Body mass index is 22.65 kg/m. General: well developed, well nourished, seated, in no evident distress  Head: head normocephalic and atraumatic. Oropharynx benign  Neck: supple   Neurologic Exam  Mental Status: Awake and fully alert. Oriented to place and time. Follows all commands. Speech and language normal. Affect flat  Cranial Nerves: Pupils equal, briskly reactive to light. Extraocular movements full without nystagmus. Visual fields full to confrontation. Hearing intact and symmetric to finger snap. Facial sensation intact. Face, tongue, palate move normally and symmetrically. Neck flexion and  extension normal.  Motor: Normal bulk and tone. Normal strength in all tested extremity muscles.No focal weakness  Coordination: Rapid alternating movements normal in all extremities. Finger-to-nose and heel-to-shin performed accurately bilaterally.  Gait and Station: Arises from chair without difficulty. Stance is normal. Gait demonstrates normal stride length and balance . Able to heel, toe and tandem walk without difficulty.  Reflexes: 2+ and symmetric. Toes downgoing.  DIAGNOSTIC DATA (LABS, IMAGING, TESTING) -  ASSESSMENT AND PLAN  41 y.o. year old male  has a past medical history of Schizophrenia (HCC) and Seizure (HCC). here to follow up. No seizure activity in several years.The patient is a current patient of Dr. Anne Hahn who is out of the office today . This note is sent to the work in doctor.     PLAN: Continue Lamictal at current dose, will refill Continue Depakote at current dose refilled by psych Labs just done by PCP Call for any seizure activity Followup yearly and when necessary [name], [facility], [facility], APRN  [facility] [address], [address] [place], [place] [address] [phone]  I reviewed the above note and documentation by the Nurse Practitioner and agree with the history, physical exam, assessment and plan as outlined above. I was immediately available for face-to-face consultation. Huston Foley, MD, PhD [facility])

## 2017-05-06 ENCOUNTER — Encounter: Payer: Self-pay | Admitting: Nurse Practitioner

## 2017-05-06 ENCOUNTER — Ambulatory Visit (INDEPENDENT_AMBULATORY_CARE_PROVIDER_SITE_OTHER): Payer: Medicaid Other | Admitting: Nurse Practitioner

## 2017-05-06 ENCOUNTER — Encounter (INDEPENDENT_AMBULATORY_CARE_PROVIDER_SITE_OTHER): Payer: Self-pay

## 2017-05-06 VITALS — BP 99/70 | HR 77 | Ht 67.0 in | Wt 144.6 lb

## 2017-05-06 DIAGNOSIS — G40309 Generalized idiopathic epilepsy and epileptic syndromes, not intractable, without status epilepticus: Secondary | ICD-10-CM | POA: Diagnosis not present

## 2017-05-06 MED ORDER — DIVALPROEX SODIUM 500 MG PO DR TAB: 1500.0000 mg | DELAYED_RELEASE_TABLET | Freq: Every day | ORAL | 11 refills | Status: DC

## 2017-05-06 MED ORDER — LAMOTRIGINE 25 MG PO TABS: 50.0000 mg | ORAL_TABLET | Freq: Two times a day (BID) | ORAL | 11 refills | Status: DC

## 2017-05-06 NOTE — Patient Instructions (Addendum)
Continue Lamictal at current dose, will refill Continue Depakote at current dose refilled  Labs just done by PCP Call for any seizure activity Followup yearly and when necessary

## 2017-05-26 ENCOUNTER — Telehealth: Payer: Self-pay | Admitting: Nurse Practitioner

## 2017-05-26 NOTE — Addendum Note (Signed)
Addended by: [place] Pink on: 05/26/2017 03:56 PM   Modules accepted: Orders

## 2017-05-26 NOTE — Telephone Encounter (Signed)
Spoke with patient's mother and informed her that Micheal Skeens, NP advised the patient's psychiatrist can continue to refill Depakote. Advised her this RN is discontinuing Rx sent to [facility] on 05/06/17. She verbalized understanding, appreciation.

## 2017-05-26 NOTE — Telephone Encounter (Signed)
Patients mother Micheal Webb (with patient at last visit) is calling in reference to his last visit with [name].  Mother is calling in reference to the divalproex (DEPAKOTE) 500 MG DR tablet.  [name] states she spoke to the pharmacy and they advised her the medication is a different kind and would like to know how come it was changed.  Per [name] patients psychiatrist fills his Depakote.

## 2017-05-26 NOTE — Telephone Encounter (Signed)
Let psych fill

## 2018-03-13 ENCOUNTER — Encounter (HOSPITAL_COMMUNITY): Payer: Self-pay | Admitting: Emergency Medicine

## 2018-03-13 ENCOUNTER — Emergency Department (HOSPITAL_COMMUNITY)
Admission: EM | Admit: 2018-03-13 | Discharge: 2018-03-13 | Disposition: A | Discharge status: Home / Self Care | Payer: Medicaid Other | Attending: Emergency Medicine | Admitting: Emergency Medicine

## 2018-03-13 ENCOUNTER — Emergency Department (HOSPITAL_COMMUNITY): Payer: Medicaid Other

## 2018-03-13 ENCOUNTER — Other Ambulatory Visit: Payer: Self-pay

## 2018-03-13 DIAGNOSIS — M25512 Pain in left shoulder: Secondary | ICD-10-CM | POA: Diagnosis not present

## 2018-03-13 DIAGNOSIS — G5602 Carpal tunnel syndrome, left upper limb: Secondary | ICD-10-CM

## 2018-03-13 DIAGNOSIS — M79622 Pain in left upper arm: Secondary | ICD-10-CM | POA: Diagnosis present

## 2018-03-13 DIAGNOSIS — Z79899 Other long term (current) drug therapy: Secondary | ICD-10-CM | POA: Insufficient documentation

## 2018-03-13 DIAGNOSIS — F17210 Nicotine dependence, cigarettes, uncomplicated: Secondary | ICD-10-CM | POA: Diagnosis not present

## 2018-03-13 MED ORDER — IBUPROFEN 400 MG PO TABS
600.0000 mg | ORAL_TABLET | Freq: Once | ORAL | Status: AC
  Administered 2018-03-13: 600 mg via ORAL
  Filled 2018-03-13: qty 1

## 2018-03-13 NOTE — ED Triage Notes (Signed)
Pt reports left arm pain x 2 months. Per EMS pt denies trauma and states that he "had a dream where he had arm pain and he has had the pain ever since." Pt sees a psychiatrist for schizophrenia. BP 134/66 P 90 100%RA

## 2018-03-13 NOTE — ED Notes (Signed)
Patient able to ambulate independently  

## 2018-03-13 NOTE — Discharge Instructions (Addendum)

## 2018-03-13 NOTE — ED Notes (Signed)
Patient transported to X-ray 

## 2018-03-13 NOTE — ED Notes (Signed)
PA at bedside speaking with patient's mother at this time

## 2018-03-13 NOTE — ED Notes (Signed)
Spoke with pt mother Elita Quick to inform her of pt presence per pt request.

## 2018-03-13 NOTE — ED Provider Notes (Addendum)
[facility] EMERGENCY DEPARTMENT Provider Note   CSN: [id number] Arrival date & time: 03/13/18  1805     History   Chief Complaint Chief Complaint  Patient presents with  . Arm Pain    HPI Micheal Webb is a 42 y.o. male.  HPI   Pt is a 42 y/o schizophrenia, seizure, who presents to the ED complains of left upper extremity pain that have been ongoing for the last 2 months.  He states that he has pain in his hand that radiates up his arm to his elbow.  He also notes that he has pain to the anterior portion of his left shoulder that hurts when he pushes on it and when he moves it.  He states that he is seen by her primary care doctor and has been worked up for this.  He was told to buy an arm brace and wear it at night.  He is unsure if he was diagnosed with carpal tunnel.  He states that he wear the arm brace and that improved his symptoms however he stopped wearing it after his symptoms improved.  He was also told to take Aleve for his symptoms.  He has tried taking Aleve with no significant resolution of his symptoms.  Denies any numbness or weakness to the arms.  No chest pain or shortness of breath.  No fevers, abdominal pain, nausea vomiting or diarrhea.  No neck pain or headaches. No recent falls or trauma.  Patient states he lives alone and his mother often helps him out around the house.  He denies SI, HI or AVH.  He has been compliant with his medications.  He has regular follow-up with psychiatry.  Past Medical History:  Diagnosis Date  . Schizophrenia (HCC)   . Seizure [facility])     Patient Active Problem List   Diagnosis Date Noted  . 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 Encounter for therapeutic drug monitoring 05/06/2013    Past Surgical History:  Procedure Laterality Date  . None          Home Medications    Prior to Admission medications   Medication Sig Start Date End Date Taking? Authorizing Provider  albuterol  (PROVENTIL HFA;VENTOLIN HFA) 108 (90 BASE) MCG/ACT inhaler Inhale 2 puffs into the lungs as needed for wheezing or shortness of breath.    [provider]  benztropine (COGENTIN) 1 MG tablet Take 1 mg by mouth. 1 TAB IN THE MORNING 2 TABS AT BEDTIME    [provider]  budesonide-formoterol (SYMBICORT) 160-4.5 MCG/ACT inhaler Inhale 2 puffs into the lungs daily.    [provider]  clonazePAM (KLONOPIN) 1 MG tablet Take 1 mg by mouth at bedtime.    [provider]  lamoTRIgine (LAMICTAL) 25 MG tablet Take 2 tablets (50 mg total) by mouth 2 (two) times daily. 05/06/17   [name], NP  risperiDONE (RISPERDAL) 2 MG tablet Take 2 mg by mouth 2 (two) times daily.     [provider]    Family History Family History  Problem Relation Age of Onset  . Breast cancer Mother   . Diverticulitis Mother        had surgery and colostomy bag 11/2015  . Colon cancer Paternal Grandfather   . Pancreatic cancer Maternal Grandmother     Social History Social History   Tobacco Use  . Smoking status: Current Every Day Smoker    Packs/day: 1.00    Types: Cigarettes  .  Smokeless tobacco: Never Used  Substance Use Topics  . Alcohol use: Yes    Comment: occasion  . Drug use: No    Comment: Denies     Allergies   Patient has no known allergies.   Review of Systems Review of Systems  Constitutional: Negative for fever.  Eyes: Negative for visual disturbance.  Respiratory: Negative for shortness of breath.   Cardiovascular: Negative for chest pain.  Gastrointestinal: Negative for abdominal pain and diarrhea.  Musculoskeletal:       Left arm pain  Neurological: Negative for weakness, numbness and headaches.   Physical Exam Updated Vital Signs BP 129/62 (BP Location: Right Arm)   Pulse 89   Temp 98.5 F (36.9 C) (Oral)   Resp 20   Ht 5\' 9"  (1.753 m)   Wt 68 kg (150 lb)   SpO2 100%   BMI 22.15 kg/m   Physical Exam  Constitutional:  He is oriented to person, place, and time. He appears well-developed and well-nourished. No distress.  Eyes: Conjunctivae are normal.  Cardiovascular: Normal rate, regular rhythm, normal heart sounds and intact distal pulses.  No murmur heard. Pulmonary/Chest: Effort normal and breath sounds normal. No stridor. He has no wheezes.  Musculoskeletal:  Patient reporting subjective tenderness to palpation diffusely to the radius, ulna and entire left hand.  He does not wince or grimace on exam.  He is moving extremities without difficulty or evidence of pain.  Mild tenderness to the biceps groove and along the deltoid muscle.  Patient has full range of motion to the bilateral upper extremities.  5/5 strength to bilateral upper extremities.  Normal sensation.  Distal pulses intact.  Phalen's test and Tinel's tests are both positive and reproduce his sxs.  Crossover test is negative.  Negative empty can.  No swelling, erythema or warmth to the shoulder joint.  No obvious deformity.  Neurological: He is alert and oriented to person, place, and time.  Skin: Skin is warm and dry. Capillary refill takes less than 2 seconds.  Psychiatric: Judgment and thought content normal.  anxious   ED Treatments / Results  Labs (all labs ordered are listed, but only abnormal results are displayed) Labs Reviewed - No data to display  EKG None  Radiology Dg Forearm Left  Result Date: 03/13/2018 CLINICAL DATA:  Continuous arm pain for 2 months EXAM: LEFT FOREARM - 2 VIEW COMPARISON:  None FINDINGS: Osseous mineralization normal. Joint spaces preserved. No fracture, dislocation, or bone destruction. Small calcified phlebolith at the medial aspect of the proximal forearm. IMPRESSION: No acute osseous abnormalities. Electronically Signed   By: Ulyses Southward M.D.   On: 03/13/2018 18:54   Dg Wrist Complete Left  Result Date: 03/13/2018 CLINICAL DATA:  Continuous arm pain for 2 months EXAM: LEFT WRIST - COMPLETE 3+ VIEW  COMPARISON:  None FINDINGS: Osseous mineralization normal. Joint spaces preserved. No fracture, dislocation, or bone destruction. IMPRESSION: Normal exam. Electronically Signed   By: Ulyses Southward M.D.   On: 03/13/2018 18:52   Dg Humerus Left  Result Date: 03/13/2018 CLINICAL DATA:  Continuous arm pain for 2 months EXAM: LEFT HUMERUS - 2+ VIEW COMPARISON:  None FINDINGS: Osseous mineralization normal. Joint spaces preserved. No fracture, dislocation, or bone destruction. Few scattered soft tissue calcifications at the upper arm, some phleboliths, others indeterminate. IMPRESSION: No acute osseous abnormalities. Few nonspecific soft tissue calcifications at LEFT upper arm. Electronically Signed   By: Ulyses Southward M.D.   On: 03/13/2018 18:54    Procedures  Procedures (including critical care time)  Medications Ordered in ED Medications  ibuprofen (ADVIL,MOTRIN) tablet 600 mg (600 mg Oral Given 03/13/18 1914)     Initial Impression / Assessment and Plan / ED Course  I have reviewed the triage vital signs and the nursing notes.  Pertinent labs & imaging results that were available during my care of the patient were reviewed by me and considered in my medical decision making (see chart for details).   Final Clinical Impressions(s) / ED Diagnoses   Final diagnoses:  Carpal tunnel syndrome of left wrist  Acute pain of left shoulder   Patient complaining of left shoulder pain and left wrist pain that radiates up the arm.  Vital signs stable.  Afebrile.  Symptoms have been ongoing for several months. He has been worked up by his PCP for this and is been told to take Aleve and use a wrist splint which he states has helped his sxs. No evidence of septic arthritis in any of the joints of the left upper extremity.  No signs or symptoms to suggest DVT and patient has no risk factors for this.  He has a positive Tinel's and Phalen's test and I suspect that his symptoms in his left wrist are due to carpal  tunnel.  I advised him to continue wearing his wrist splint at night and throughout the day if it provides him comfort.  He also has tenderness to the anterior aspect of the shoulder over the biceps groove suggesting biceps tendinitis.  He has no chest pain or shortness of breath to suggest a cardiac or pulmonary etiology of his symptoms.  Symptoms are reproducible with movement and palpation. xrays of the LUE are negative for acute fracture or bony abnormality. Advised him to continue Tylenol and ibuprofen at home for pain control.  Advised ice and stretches as well.  Gave him referral to orthopedics and advised him to follow-up with his PCP in 1 week for reevaluation.  Strict return precautions discussed for any new or worsening symptoms.  All questions answered patient understands plan reasons to return immediately to the ED.  ED Discharge Orders    None        Rayne Du 03/13/18 2027    Donnetta Hutching, MD 03/14/18 1640

## 2018-05-06 NOTE — Progress Notes (Signed)
[facility]  PATIENT: Micheal Webb DOB: [birth year]   REASON FOR VISIT: Follow-up for generalized seizure disorder HISTORY FROM: Patient, mother    HISTORY OF PRESENT ILLNESS:Micheal Webb, 42 yr old white male returns for yearly followup with history of seizures.  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 The patient is on Depakote in part for psychiatric reasons, but this medication alone did not fully control his seizures. This patient has been on a low dose of Lamictal, taking 50 mg twice daily since 2015 and no further seizure events since that time.  The patient has tolerated the medication well.  Labs through PCP, these are due next week.[name]   His schizophrenia is treated through psychiatry. He returns for reevaluation   REVIEW OF SYSTEMS: Full 14 system review of systems performed and notable only for those listed, all others are neg:  Constitutional: neg  Cardiovascular: neg Ear/Nose/Throat: neg  Skin: neg Eyes: neg Respiratory: neg Gastroitestinal: neg  Hematology/Lymphatic: neg  Endocrine: neg Musculoskeletal: neg Allergy/Immunology: neg Neurological: Seizure disorder Psychiatric: Schizophrenia treated by psychiatry Sleep : neg   ALLERGIES: No Known Allergies  HOME MEDICATIONS: Outpatient Medications Prior to Visit  Medication Sig Dispense Refill  . albuterol (PROVENTIL HFA;VENTOLIN HFA) 108 (90 BASE) MCG/ACT inhaler Inhale 2 puffs into the lungs as needed for wheezing or shortness of breath.    . benztropine (COGENTIN) 1 MG tablet Take 1 mg by mouth. 1 TAB IN THE MORNING 2 TABS AT BEDTIME    . budesonide-formoterol (SYMBICORT) 160-4.5 MCG/ACT inhaler Inhale 2 puffs into the lungs daily.    . clonazePAM (KLONOPIN) 1 MG tablet Take 1 mg by mouth at bedtime.    . divalproex (DEPAKOTE ER) 500 MG 24 hr tablet 500 mg at bedtime. 05/07/18 taking three tabs at bedtime  5  . lamoTRIgine (LAMICTAL) 25 MG tablet Take  2 tablets (50 mg total) by mouth 2 (two) times daily. 120 tablet 11  . pravastatin (PRAVACHOL) 10 MG tablet 10 mg daily.  0  . risperiDONE (RISPERDAL) 2 MG tablet Take 2 mg by mouth 2 (two) times daily.      No facility-administered medications prior to visit.     PAST MEDICAL HISTORY: Past Medical History:  Diagnosis Date  . Carpal tunnel syndrome on left   . Schizophrenia (HCC)   . Seizure (HCC)     PAST SURGICAL HISTORY: Past Surgical History:  Procedure Laterality Date  . None      FAMILY HISTORY: Family History  Problem Relation Age of Onset  . Breast cancer Mother   . Diverticulitis Mother        had surgery and colostomy bag 11/2015  . Colon cancer Paternal Grandfather   . Pancreatic cancer Maternal Grandmother     SOCIAL HISTORY: Social History   Socioeconomic History  . Marital status: Single    Spouse name: Not on file  . Number of children: Not on file  . Years of education: Not on file  . Highest education level: Not on file  Occupational History  . Not on file  Social Needs  . Financial resource strain: Not on file  . Food insecurity:    Worry: Not on file    Inability: Not on file  . Transportation needs:    Medical: Not on file    Non-medical: Not on file  Tobacco Use  . Smoking status: Current Every Day Smoker    Packs/day: 1.00  Types: Cigarettes  . Smokeless tobacco: Never Used  Substance and Sexual Activity  . Alcohol use: Yes    Comment: occasion  . Drug use: No    Comment: Denies  . Sexual activity: Not on file  Lifestyle  . Physical activity:    Days per week: Not on file    Minutes per session: Not on file  . Stress: Not on file  Relationships  . Social connections:    Talks on phone: Not on file    Gets together: Not on file    Attends religious service: Not on file    Active member of club or organization: Not on file    Attends meetings of clubs or organizations: Not on file    Relationship status: Not on file  .  Intimate partner violence:    Fear of current or ex partner: Not on file    Emotionally abused: Not on file    Physically abused: Not on file    Forced sexual activity: Not on file  Other Topics Concern  . Not on file  Social History Narrative   Patient lives alone.    Patient is on disability.   Patient is single.         PHYSICAL EXAM  Vitals:   05/07/18 1111  BP: 100/69  Pulse: 77  Weight: 145 lb (65.8 kg)  Height: 5\' 9"  (1.753 m)   Body mass index is 21.41 kg/m. General: well developed, well nourished, seated, in no evident distress  Head: head normocephalic and atraumatic. Oropharynx benign  Neck: supple   Neurologic Exam  Mental Status: Awake and fully alert. Oriented to place and time. Follows all commands. Speech and language normal. Affect flat  Cranial Nerves: Pupils equal, briskly reactive to light. Extraocular movements full without nystagmus. Visual fields full to confrontation. Hearing intact and symmetric to finger snap. Facial sensation intact. Face, tongue, palate move normally and symmetrically. Neck flexion and extension normal.  Motor: Normal bulk and tone. Normal strength in all tested extremity muscles.No focal weakness  Coordination: Rapid alternating movements normal in all extremities. Finger-to-nose and heel-to-shin performed accurately bilaterally.  Gait and Station: Arises from chair without difficulty. Stance is normal. Gait demonstrates normal stride length and balance . Able to heel, toe and tandem walk without difficulty.  Reflexes: Symmetric upper and lower. Toes downgoing.  DIAGNOSTIC DATA (LABS, IMAGING, TESTING) -  ASSESSMENT AND PLAN  42 y.o. year old male  has a past medical history of Carpal tunnel syndrome on left, Schizophrenia (HCC), and Seizure (HCC). here to follow up. No seizure activity since 2015. [name]     PLAN: Continue Lamictal at current dose, will refill Continue Depakote at current dose refilled by psych Labs to  be done by  PCP next week please send a copy Call for any seizure activity Followup yearly and when necessary [name], [facility], [facility], APRN  [facility] [address] [place], [place] [address] [phone]

## 2018-05-07 ENCOUNTER — Encounter: Payer: Self-pay | Admitting: Nurse Practitioner

## 2018-05-07 ENCOUNTER — Ambulatory Visit: Payer: Medicaid Other | Admitting: Nurse Practitioner

## 2018-05-07 VITALS — BP 100/69 | HR 77 | Ht 69.0 in | Wt 145.0 lb

## 2018-05-07 DIAGNOSIS — G40309 Generalized idiopathic epilepsy and epileptic syndromes, not intractable, without status epilepticus: Secondary | ICD-10-CM | POA: Diagnosis not present

## 2018-05-07 MED ORDER — LAMOTRIGINE 25 MG PO TABS: 50.0000 mg | ORAL_TABLET | Freq: Two times a day (BID) | ORAL | 11 refills | Status: DC

## 2018-05-07 NOTE — Progress Notes (Signed)
I have read the note, and I agree with the clinical assessment and plan.  Beckem Tomberlin K Kateleen Encarnacion   

## 2018-05-07 NOTE — Patient Instructions (Signed)
Continue Lamictal at current dose, will refill Continue Depakote at current dose refilled by psych Labs to be done by  PCP next week Call for any seizure activity Followup yearly and when necessary

## 2018-05-12 DIAGNOSIS — J449 Chronic obstructive pulmonary disease, unspecified: Secondary | ICD-10-CM

## 2018-05-12 DIAGNOSIS — E559 Vitamin D deficiency, unspecified: Secondary | ICD-10-CM | POA: Diagnosis not present

## 2018-05-12 DIAGNOSIS — G5602 Carpal tunnel syndrome, left upper limb: Secondary | ICD-10-CM

## 2018-05-12 DIAGNOSIS — E785 Hyperlipidemia, unspecified: Secondary | ICD-10-CM | POA: Diagnosis not present

## 2018-05-12 DIAGNOSIS — Z0000 Encounter for general adult medical examination without abnormal findings: Secondary | ICD-10-CM | POA: Diagnosis not present

## 2018-05-12 DIAGNOSIS — Z125 Encounter for screening for malignant neoplasm of prostate: Secondary | ICD-10-CM | POA: Diagnosis not present

## 2018-08-24 ENCOUNTER — Other Ambulatory Visit: Payer: Self-pay | Admitting: Nurse Practitioner

## 2018-11-10 ENCOUNTER — Ambulatory Visit: Payer: Medicaid Other | Admitting: Nurse Practitioner

## 2018-11-10 ENCOUNTER — Encounter: Payer: Self-pay | Admitting: Nurse Practitioner

## 2018-11-10 VITALS — BP 124/80 | HR 77 | Ht 69.0 in | Wt 147.6 lb

## 2018-11-10 DIAGNOSIS — E782 Mixed hyperlipidemia: Secondary | ICD-10-CM | POA: Diagnosis not present

## 2018-11-10 DIAGNOSIS — G40309 Generalized idiopathic epilepsy and epileptic syndromes, not intractable, without status epilepticus: Secondary | ICD-10-CM

## 2018-11-10 DIAGNOSIS — F209 Schizophrenia, unspecified: Secondary | ICD-10-CM

## 2018-11-10 NOTE — Progress Notes (Signed)
Subjective:     Patient ID: Micheal Webb , male    DOB: 03-Jul-1976 , 43 y.o.   MRN: [id number]   Chief Complaint  Patient presents with  . Hyperlipidemia    follow up     HPI  Hyperlipidemia - doing well with his medications, denies muscle weakness.  Micheal Webb (psychiatry) - [facility]  Here today again with his mother.    Past Medical History:  Diagnosis Date  . Carpal tunnel syndrome on left   . Schizophrenia (HCC)   . Seizure [facility])      Family History  Problem Relation Age of Onset  . Breast cancer Mother   . Diverticulitis Mother        had surgery and colostomy bag 11/2015  . Colon cancer Paternal Grandfather   . Pancreatic cancer Maternal Grandmother      Current Outpatient Medications:  .  albuterol (PROVENTIL HFA;VENTOLIN HFA) 108 (90 BASE) MCG/ACT inhaler, Inhale 2 puffs into the lungs as needed for wheezing or shortness of breath., Disp: , Rfl:  .  benztropine (COGENTIN) 1 MG tablet, Take 1 mg by mouth. 1 TAB IN THE MORNING 2 TABS AT BEDTIME, Disp: , Rfl:  .  budesonide-formoterol (SYMBICORT) 160-4.5 MCG/ACT inhaler, Inhale 2 puffs into the lungs daily., Disp: , Rfl:  .  clonazePAM (KLONOPIN) 1 MG tablet, Take 1 mg by mouth at bedtime., Disp: , Rfl:  .  divalproex (DEPAKOTE ER) 500 MG 24 hr tablet, 500 mg at bedtime. 05/07/18 taking three tabs at bedtime, Disp: , Rfl: 5 .  pravastatin (PRAVACHOL) 10 MG tablet, TAKE 1 TABLET BY MOUTH EVERY DAY, Disp: 90 tablet, Rfl: 0 .  risperiDONE (RISPERDAL) 2 MG tablet, Take 2 mg by mouth 2 (two) times daily. , Disp: , Rfl:    No Known Allergies   Review of Systems  Constitutional: Negative for fatigue.  Respiratory: Negative for cough and shortness of breath.   Cardiovascular: Negative.  Negative for chest pain, palpitations and leg swelling.  Endocrine: Negative for polydipsia, polyphagia and polyuria.  Neurological: Negative for dizziness and headaches.     Today's Vitals   11/10/18 1509  BP: 124/80  Pulse: 77   SpO2: 97%  Weight: 147 lb 9.6 oz (67 kg)  Height: 5\' 9"  (1.753 m)   Body mass index is 21.8 kg/m.   Objective:  Physical Exam Vitals signs reviewed.  Constitutional:      Appearance: Normal appearance.  Cardiovascular:     Rate and Rhythm: Normal rate and regular rhythm.     Pulses: Normal pulses.     Heart sounds: Normal heart sounds. No murmur.  Pulmonary:     Effort: Pulmonary effort is normal.     Breath sounds: Normal breath sounds.  Neurological:     Mental Status: He is alert.  Psychiatric:        Mood and Affect: Mood normal.        Behavior: Behavior normal.        Thought Content: Thought content normal.        Judgment: Judgment normal.         Assessment And Plan:     1. Mixed hyperlipidemia  Chronic, controlled  Continue with current medications  Discussed increasing intake of fish and nuts for low HDL. - Lipid panel - CMP14 + Anion Gap  2. Generalized convulsive epilepsy (HCC)  Chronic, controlled  Continue with current medications  Continue follow up with Dr. Anne Hahn, will send copy of labs  once results received - Lipid panel - CMP14 + Anion Gap  3. Schizophrenia, unspecified type (HCC)  Chronic, controlled  Being followed at [facility]  Continue with current medications  Will send copy of labs to Micheal Webb - Lipid panel - CMP14 + Anion Gap     [name], FNP

## 2018-11-11 LAB — LIPID PANEL
Chol/HDL Ratio: 4.9 ratio (ref 0.0–5.0)
Cholesterol, Total: 172 mg/dL (ref 100–199)
HDL: 35 mg/dL — ABNORMAL LOW (ref >39)
LDL CALC: 119 mg/dL — AB (ref 0–99)
Triglycerides: 91 mg/dL (ref 0–149)
VLDL Cholesterol Cal: 18 mg/dL (ref 5–40)

## 2018-11-11 LAB — CMP14 + ANION GAP
ALT: 14 IU/L (ref 0–44)
AST: 15 IU/L (ref 0–40)
Albumin/Globulin Ratio: 1.9 (ref 1.2–2.2)
Albumin: 4.6 g/dL (ref 4.0–5.0)
Alkaline Phosphatase: 81 IU/L (ref 39–117)
Anion Gap: 18 mmol/L (ref 10.0–18.0)
BUN/Creatinine Ratio: 10 (ref 9–20)
BUN: 8 mg/dL (ref 6–24)
Bilirubin Total: 0.4 mg/dL (ref 0.0–1.2)
CO2: 23 mmol/L (ref 20–29)
Calcium: 9.7 mg/dL (ref 8.7–10.2)
Chloride: 99 mmol/L (ref 96–106)
Creatinine, Ser: 0.82 mg/dL (ref 0.76–1.27)
GFR calc Af Amer: 123 mL/min/{1.73_m2} (ref >59)
GFR calc non Af Amer: 107 mL/min/{1.73_m2} (ref >59)
Globulin, Total: 2.4 g/dL (ref 1.5–4.5)
Glucose: 83 mg/dL (ref 65–99)
Potassium: 4.1 mmol/L (ref 3.5–5.2)
Sodium: 140 mmol/L (ref 134–144)
Total Protein: 7 g/dL (ref 6.0–8.5)

## 2018-11-24 ENCOUNTER — Other Ambulatory Visit: Payer: Self-pay | Admitting: Nurse Practitioner

## 2018-11-24 MED ORDER — ALBUTEROL SULFATE HFA 108 (90 BASE) MCG/ACT IN AERS: 2.0000 | INHALATION_SPRAY | RESPIRATORY_TRACT | 6 refills | Status: DC | PRN

## 2018-11-24 MED ORDER — BUDESONIDE-FORMOTEROL FUMARATE 160-4.5 MCG/ACT IN AERO: 2.0000 | INHALATION_SPRAY | Freq: Every day | RESPIRATORY_TRACT | 6 refills | Status: DC

## 2019-04-26 ENCOUNTER — Other Ambulatory Visit: Payer: Self-pay | Admitting: Nurse Practitioner

## 2019-05-11 ENCOUNTER — Telehealth: Payer: Self-pay

## 2019-05-11 NOTE — Telephone Encounter (Signed)
I contacted the pt and left a vm asking pt/his mother to call back so we could reschedule his 05/13/2019 appt due to covid 72 and our office not being open on fridays. When pt calls back please schedule with NP ( Sara/Amy). Pt has been removed from 05/13/2019 schedule.

## 2019-05-13 ENCOUNTER — Ambulatory Visit: Payer: Medicaid Other | Admitting: Neurology

## 2019-05-19 ENCOUNTER — Encounter: Payer: Medicaid Other | Admitting: Nurse Practitioner

## 2019-05-25 ENCOUNTER — Encounter: Payer: Medicaid Other | Admitting: Nurse Practitioner

## 2019-05-30 ENCOUNTER — Encounter: Payer: Self-pay | Admitting: Family Medicine

## 2019-05-30 ENCOUNTER — Other Ambulatory Visit: Payer: Self-pay

## 2019-05-30 ENCOUNTER — Ambulatory Visit: Payer: Medicaid Other | Admitting: Family Medicine

## 2019-05-30 VITALS — BP 148/79 | HR 73 | Temp 96.2°F | Ht 69.0 in | Wt 147.4 lb

## 2019-05-30 DIAGNOSIS — G40309 Generalized idiopathic epilepsy and epileptic syndromes, not intractable, without status epilepticus: Secondary | ICD-10-CM

## 2019-05-30 NOTE — Progress Notes (Signed)
PATIENT: Micheal Webb DOB: 02-26-1976  REASON FOR VISIT: follow up HISTORY FROM: patient  Chief Complaint  Patient presents with  . Follow-up    Yearly f/u. Mother present. Rm 5. No new concerns at this time.      HISTORY OF PRESENT ILLNESS: Today 05/30/19 Micheal Webb is a 43 y.o. male here today for follow up for seizure disorder. He has not had any seizure activity. He continues divalproex ER 1500mg  at bedtime. He is taking lamotrigine 50mg  twice daily. He continues to follow psychiatry closely for schizophrenia. He is taking clonazepam 1mg  at bedtime and risperidone 2mg  twice daily. All above medications are currently prescribed by psychiatry. He is followed regularly by PCP as well. He requests to get labs drawn at PCP office.   HISTORY: (copied from [place] [facility]'s note on8/30/2019)  [name], 43 yr old white male returns for yearly followup with history of seizures.  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 The patient is on Depakote in part for psychiatric reasons, but this medication alone did not fully control his seizures. This patient has been on a low dose of Lamictal, taking 50 mg twice daily since 2015 and no further seizure events since that time.  The patient has tolerated the medication well.  Labs through PCP, these are due next week.05/09/2018   His schizophrenia is treated through psychiatry. He returns for reevaluation  REVIEW OF SYSTEMS: Out of a complete 14 system review of symptoms, the patient complains only of the following symptoms, none and all other reviewed systems are negative.  ALLERGIES: No Known Allergies  HOME MEDICATIONS: Outpatient Medications Prior to Visit  Medication Sig Dispense Refill  . albuterol (PROVENTIL HFA;VENTOLIN HFA) 108 (90 Base) MCG/ACT inhaler Inhale 2 puffs into the lungs as needed for wheezing or shortness of breath. 1 Inhaler 6  . benztropine (COGENTIN) 1 MG tablet Take 1 mg by  mouth. 1 TAB IN THE MORNING 2 TABS AT BEDTIME    . budesonide-formoterol (SYMBICORT) 160-4.5 MCG/ACT inhaler Inhale 2 puffs into the lungs daily. 1 Inhaler 6  . clonazePAM (KLONOPIN) 1 MG tablet Take 1 mg by mouth at bedtime.    . divalproex (DEPAKOTE ER) 500 MG 24 hr tablet 500 mg at bedtime. 05/07/18 taking three tabs at bedtime  5  . lamoTRIgine (LAMICTAL) 25 MG tablet Take 50 mg by mouth 2 (two) times daily.    . pravastatin (PRAVACHOL) 10 MG tablet TAKE 1 TABLET BY MOUTH EVERY DAY 90 tablet 0  . risperiDONE (RISPERDAL) 2 MG tablet Take 2 mg by mouth 2 (two) times daily.      No facility-administered medications prior to visit.     PAST MEDICAL HISTORY: Past Medical History:  Diagnosis Date  . Carpal tunnel syndrome on left   . Schizophrenia (HCC)   . Seizure (HCC)     PAST SURGICAL HISTORY: Past Surgical History:  Procedure Laterality Date  . None      FAMILY HISTORY: Family History  Problem Relation Age of Onset  . Breast cancer Mother   . Diverticulitis Mother        had surgery and colostomy bag 11/2015  . Colon cancer Paternal Grandfather   . Pancreatic cancer Maternal Grandmother     SOCIAL HISTORY: Social History   Socioeconomic History  . Marital status: Single    Spouse name: Not on file  . Number of children: Not on file  . Years of education:  Not on file  . Highest education level: Not on file  Occupational History  . Not on file  Social Needs  . Financial resource strain: Not on file  . Food insecurity    Worry: Not on file    Inability: Not on file  . Transportation needs    Medical: Not on file    Non-medical: Not on file  Tobacco Use  . Smoking status: Current Every Day Smoker    Packs/day: 1.00    Types: Cigarettes  . Smokeless tobacco: Never Used  Substance and Sexual Activity  . Alcohol use: Yes    Comment: occasion  . Drug use: No    Comment: Denies  . Sexual activity: Not on file  Lifestyle  . Physical activity    Days per week:  Not on file    Minutes per session: Not on file  . Stress: Not on file  Relationships  . Social Musicianconnections    Talks on phone: Not on file    Gets together: Not on file    Attends religious service: Not on file    Active member of club or organization: Not on file    Attends meetings of clubs or organizations: Not on file    Relationship status: Not on file  . Intimate partner violence    Fear of current or ex partner: Not on file    Emotionally abused: Not on file    Physically abused: Not on file    Forced sexual activity: Not on file  Other Topics Concern  . Not on file  Social History Narrative   Patient lives alone.    Patient is on disability.   Patient is single.          PHYSICAL EXAM  Vitals:   05/30/19 1339  BP: (!) 148/79  Pulse: 73  Temp: (!) 96.2 F (35.7 C)  TempSrc: Oral  Weight: 147 lb 6.4 oz (66.9 kg)  Height: 5\' 9"  (1.753 m)   Body mass index is 21.77 kg/m.  Generalized: Well developed, in no acute distress  Cardiology: normal rate and rhythm, no murmur noted Neurological examination  Mentation: Alert oriented to time, place, history taking. Follows all commands speech and language fluent Cranial nerve II-XII: Pupils were equal round reactive to light. Extraocular movements were full, visual field were full on confrontational test. Facial sensation and strength were normal. Uvula tongue midline. Head turning and shoulder shrug  were normal and symmetric. Motor: The motor testing reveals 5 over 5 strength of all 4 extremities. Good symmetric motor tone is noted throughout.  Sensory: Sensory testing is intact to soft touch on all 4 extremities. No evidence of extinction is noted.  Coordination: Cerebellar testing reveals good finger-nose-finger and heel-to-shin bilaterally.  Gait and station: Gait is normal.   DIAGNOSTIC DATA (LABS, IMAGING, TESTING) - I reviewed patient records, labs, notes, testing and imaging myself where available.  No  flowsheet data found.   Lab Results  Component Value Date   WBC 12.0 (H) 05/07/2015   HGB 16.5 05/07/2015   HCT 45.3 05/07/2015   MCV 89 05/07/2015   PLT 219 05/07/2015      Component Value Date/Time   NA 140 11/10/2018 1538   K 4.1 11/10/2018 1538   CL 99 11/10/2018 1538   CO2 23 11/10/2018 1538   GLUCOSE 83 11/10/2018 1538   GLUCOSE 97 07/03/2014 1111   BUN 8 11/10/2018 1538   CREATININE 0.82 11/10/2018 1538   CALCIUM 9.7 11/10/2018  1538   PROT 7.0 11/10/2018 1538   ALBUMIN 4.6 11/10/2018 1538   AST 15 11/10/2018 1538   ALT 14 11/10/2018 1538   ALKPHOS 81 11/10/2018 1538   BILITOT 0.4 11/10/2018 1538   GFRNONAA 107 11/10/2018 1538   GFRAA 123 11/10/2018 1538   Lab Results  Component Value Date   CHOL 172 11/10/2018   HDL 35 (L) 11/10/2018   LDLCALC 119 (H) 11/10/2018   TRIG 91 11/10/2018   CHOLHDL 4.9 11/10/2018   No results found for: HGBA1C No results found for: VITAMINB12 No results found for: TSH     ASSESSMENT AND PLAN 43 y.o. year old male  has a past medical history of Carpal tunnel syndrome on left, Schizophrenia ([place]), and Seizure ([place]). here with     ICD-10-CM   1. Generalized convulsive epilepsy ([place])  G40.309 Valproic acid level    Lamotrigine level    CBC with Differential/Platelets    CMP    Treyten continues to do well on current therapy.  We will continue divalproex acid 1500 mg at bedtime as well as lamotrigine 50 mg twice daily.  He was encouraged to continue close follow-up with psychiatry.  We have also discussed blood pressures in the office today.  I would like for him to follow-up with primary care as soon as possible.  He and his mother both verbalized understanding.  We have placed a lab orders today to be drawn at primary care office per his request.  He will follow-up with me in 1 year, sooner if needed.  He verbalizes understanding and agreement with this plan.   Orders Placed This Encounter  Procedures  . Valproic acid level     Standing Status:   Future    Standing Expiration Date:   05/29/2020  . Lamotrigine level    Standing Status:   Future    Standing Expiration Date:   05/29/2020  . CBC with Differential/Platelets    Standing Status:   Future    Standing Expiration Date:   05/29/2020  . CMP    Standing Status:   Future    Standing Expiration Date:   05/29/2020     No orders of the defined types were placed in this encounter.     I spent 15 minutes with the patient. 50% of this time was spent counseling and educating patient on plan of care and medications.    Debbora Presto, FNP-C 05/30/2019, 4:19 PM [facility] [address], [place], [place] [address] [phone]

## 2019-05-30 NOTE — Patient Instructions (Signed)
Continue Depakote and Lamictal as prescribed.   Continue close follow up with psychiatry and PCP  Follow up with me in 1 year, sooner if needed  Seizure, Adult A seizure is a sudden burst of abnormal electrical activity in the brain. Seizures usually last from 30 seconds to 2 minutes. They can cause many different symptoms. Usually, seizures are not harmful unless they last a long time. What are the causes? Common causes of this condition include:  Fever or infection.  Conditions that affect the brain, such as: ? A brain abnormality that you were born with. ? A brain or head injury. ? Bleeding in the brain. ? A tumor. ? Stroke. ? Brain disorders such as autism or cerebral palsy.  Low blood sugar.  Conditions that are passed from parent to child (are inherited).  Problems with substances, such as: ? Having a reaction to a drug or a medicine. ? Suddenly stopping the use of a substance (withdrawal). In some cases, the cause may not be known. A person who has repeated seizures over time without a clear cause has a condition called epilepsy. What increases the risk? You are more likely to get this condition if you have:  A family history of epilepsy.  Had a seizure in the past.  A brain disorder.  A history of head injury, lack of oxygen at birth, or strokes. What are the signs or symptoms? There are many types of seizures. The symptoms vary depending on the type of seizure you have. Examples of symptoms during a seizure include:  Shaking (convulsions).  Stiffness in the body.  Passing out (losing consciousness).  Head nodding.  Staring.  Not responding to sound or touch.  Loss of bladder control and bowel control. Some people have symptoms right before and right after a seizure happens. Symptoms before a seizure may include:  Fear.  Worry (anxiety).  Feeling like you may vomit (nauseous).  Feeling like the room is spinning (vertigo).  Feeling like you  saw or heard something before (dj vu).  Odd tastes or smells.  Changes in how you see. You may see flashing lights or spots. Symptoms after a seizure happens can include:  Confusion.  Sleepiness.  Headache.  Weakness on one side of the body. How is this treated? Most seizures will stop on their own in under 5 minutes. In these cases, no treatment is needed. Seizures that last longer than 5 minutes will usually need treatment. Treatment can include:  Medicines given through an IV tube.  Avoiding things that are known to cause your seizures. These can include medicines that you take for another condition.  Medicines to treat epilepsy.  Surgery to stop the seizures. This may be needed if medicines do not help. Follow these instructions at home: Medicines  Take over-the-counter and prescription medicines only as told by your doctor.  Do not eat or drink anything that may keep your medicine from working, such as alcohol. Activity  Do not do any activities that would be dangerous if you had another seizure, like driving or swimming. Wait until your doctor says it is safe for you to do them.  If you live in the [place] ask your local DMV (department of motor vehicles) when you can drive.  Get plenty of rest. Teaching others Teach friends and family what to do when you have a seizure. They should:  Lay you on the ground.  Protect your head and body.  Loosen any tight clothing around your neck.  Turn you on your side.  Not hold you down.  Not put anything into your mouth.  Know whether or not you need emergency care.  Stay with you until you are better.  General instructions  Contact your doctor each time you have a seizure.  Avoid anything that gives you seizures.  Keep a seizure diary. Write down: ? What you think caused each seizure. ? What you remember about each seizure.  Keep all follow-up visits as told by your doctor. This is important. Contact a  doctor if:  You have another seizure.  You have seizures more often.  There is any change in what happens during your seizures.  You keep having seizures with treatment.  You have symptoms of being sick or having an infection. Get help right away if:  You have a seizure that: ? Lasts longer than 5 minutes. ? Is different than seizures you had before. ? Makes it harder to breathe. ? Happens after you hurt your head.  You have any of these symptoms after a seizure: ? Not being able to speak. ? Not being able to use a part of your body. ? Confusion. ? A bad headache.  You have two or more seizures in a row.  You do not wake up right after a seizure.  You get hurt during a seizure. These symptoms may be an emergency. Do not wait to see if the symptoms will go away. Get medical help right away. Call your local emergency services (911 in the [place] Do not drive yourself to the hospital. Summary  Seizures usually last from 30 seconds to 2 minutes. Usually, they are not harmful unless they last a long time.  Do not eat or drink anything that may keep your medicine from working, such as alcohol.  Teach friends and family what to do when you have a seizure.  Contact your doctor each time you have a seizure. This information is not intended to replace advice given to you by your health care provider. Make sure you discuss any questions you have with your health care provider. Document Released: 02/11/2008 Document Revised: 11/12/2018 Document Reviewed: 11/12/2018 Elsevier Patient Education  [place].

## 2019-05-30 NOTE — Progress Notes (Signed)
I have read the note, and I agree with the clinical assessment and plan.  Charles K Willis   

## 2019-05-31 NOTE — Addendum Note (Signed)
Addended by: Inis Sizer D on: 05/31/2019 11:40 AM   Modules accepted: Orders

## 2019-06-06 NOTE — Progress Notes (Signed)
Can you see if we can track down results please?

## 2019-06-08 ENCOUNTER — Encounter: Payer: Self-pay | Admitting: Nurse Practitioner

## 2019-06-08 ENCOUNTER — Other Ambulatory Visit: Payer: Self-pay

## 2019-06-08 ENCOUNTER — Ambulatory Visit (INDEPENDENT_AMBULATORY_CARE_PROVIDER_SITE_OTHER): Payer: Medicaid Other | Admitting: Nurse Practitioner

## 2019-06-08 VITALS — BP 116/70 | HR 86 | Temp 98.1°F | Wt 150.0 lb

## 2019-06-08 DIAGNOSIS — G40309 Generalized idiopathic epilepsy and epileptic syndromes, not intractable, without status epilepticus: Secondary | ICD-10-CM

## 2019-06-08 DIAGNOSIS — E782 Mixed hyperlipidemia: Secondary | ICD-10-CM | POA: Diagnosis not present

## 2019-06-08 DIAGNOSIS — H6123 Impacted cerumen, bilateral: Secondary | ICD-10-CM | POA: Diagnosis not present

## 2019-06-08 DIAGNOSIS — E559 Vitamin D deficiency, unspecified: Secondary | ICD-10-CM

## 2019-06-08 DIAGNOSIS — Z0000 Encounter for general adult medical examination without abnormal findings: Secondary | ICD-10-CM | POA: Diagnosis not present

## 2019-06-08 DIAGNOSIS — Z125 Encounter for screening for malignant neoplasm of prostate: Secondary | ICD-10-CM

## 2019-06-08 DIAGNOSIS — F209 Schizophrenia, unspecified: Secondary | ICD-10-CM

## 2019-06-08 NOTE — Progress Notes (Signed)
Subjective:     Patient ID: Micheal Webb , male    DOB: Sep 15, 1975 , 43 y.o.   MRN: [id number]   Chief Complaint  Patient presents with  . Annual Exam    HPI  Here for HM  Last visit with psychiatry in May Telehealth. Seen Amy Lomax NP - for his seizures.  Lindaann Slough with Dr. Betti Cruz [facility] will need copy of lab results   Men's preventive visit. Patient Health Questionnaire (PHQ-2) is    Office Visit from 06/08/2019 in [facility]  PHQ-2 Total Score  0     Patient is on a regular diet.  Marital status: Single. Relevant history for alcohol use is:  Social History   Substance and Sexual Activity  Alcohol Use Yes   Comment: occasion   Relevant history for tobacco use is:  Social History   Tobacco Use  Smoking Status Current Every Day Smoker  . Packs/day: 1.00  . Types: Cigarettes  Smokeless Tobacco Never Used   Past Medical History:  Diagnosis Date  . Carpal tunnel syndrome on left   . Schizophrenia (HCC)   . Seizure [facility])      Family History  Problem Relation Age of Onset  . Breast cancer Mother   . Diverticulitis Mother        had surgery and colostomy bag 11/2015  . Colon cancer Paternal Grandfather   . Pancreatic cancer Maternal Grandmother      Current Outpatient Medications:  .  albuterol (PROVENTIL HFA;VENTOLIN HFA) 108 (90 Base) MCG/ACT inhaler, Inhale 2 puffs into the lungs as needed for wheezing or shortness of breath., Disp: 1 Inhaler, Rfl: 6 .  benztropine (COGENTIN) 1 MG tablet, Take 1 mg by mouth. 1 TAB IN THE MORNING 2 TABS AT BEDTIME, Disp: , Rfl:  .  budesonide-formoterol (SYMBICORT) 160-4.5 MCG/ACT inhaler, Inhale 2 puffs into the lungs daily., Disp: 1 Inhaler, Rfl: 6 .  clonazePAM (KLONOPIN) 1 MG tablet, Take 1 mg by mouth at bedtime., Disp: , Rfl:  .  divalproex (DEPAKOTE ER) 500 MG 24 hr tablet, 500 mg at bedtime. 05/07/18 taking three tabs at bedtime, Disp: , Rfl: 5 .  lamoTRIgine (LAMICTAL) 25 MG  tablet, Take 50 mg by mouth 2 (two) times daily., Disp: , Rfl:  .  pravastatin (PRAVACHOL) 10 MG tablet, TAKE 1 TABLET BY MOUTH EVERY DAY, Disp: 90 tablet, Rfl: 0 .  risperiDONE (RISPERDAL) 2 MG tablet, Take 2 mg by mouth 2 (two) times daily. , Disp: , Rfl:    No Known Allergies   Review of Systems  Constitutional: Negative.   HENT: Negative.   Eyes: Negative.   Respiratory: Negative.   Cardiovascular: Negative.   Gastrointestinal: Negative.   Endocrine: Negative.   Genitourinary: Negative.   Musculoskeletal: Negative.   Skin: Negative.   Allergic/Immunologic: Negative.   Neurological: Negative.   Hematological: Negative.   Psychiatric/Behavioral: Negative.      Today's Vitals   06/08/19 1442  BP: 116/70  Pulse: 86  Temp: 98.1 F (36.7 C)  TempSrc: Oral  Weight: 150 lb (68 kg)  PainSc: 0-No pain   Body mass index is 22.15 kg/m.   Objective:  Physical Exam Vitals signs reviewed.  Constitutional:      Appearance: Normal appearance. He is obese.     Comments: Smells heavily of cigarette smoke  HENT:     Head: Normocephalic and atraumatic.     Right Ear: Tympanic membrane and external ear normal. There is impacted  cerumen (semi hard cerumen present, unable to visualize TM).     Left Ear: Tympanic membrane and external ear normal. There is impacted cerumen (semi hard cerumen present, unable to visualize TM).  Eyes:     Extraocular Movements: Extraocular movements intact.     Conjunctiva/sclera: Conjunctivae normal.     Pupils: Pupils are equal, round, and reactive to light.  Neck:     Musculoskeletal: Normal range of motion and neck supple.  Cardiovascular:     Rate and Rhythm: Normal rate and regular rhythm.     Pulses: Normal pulses.     Heart sounds: Normal heart sounds. No murmur.  Pulmonary:     Effort: Pulmonary effort is normal. No respiratory distress.     Breath sounds: Normal breath sounds.  Abdominal:     General: Abdomen is flat. Bowel sounds are  normal. There is no distension.     Palpations: Abdomen is soft.  Musculoskeletal: Normal range of motion.  Skin:    General: Skin is warm.     Capillary Refill: Capillary refill takes less than 2 seconds.  Neurological:     General: No focal deficit present.     Mental Status: He is alert and oriented to person, place, and time.  Psychiatric:        Mood and Affect: Mood normal.        Behavior: Behavior normal.        Thought Content: Thought content normal.        Judgment: Judgment normal.         Assessment And Plan:     1. Health maintenance examination . Behavior modifications discussed and diet history reviewed.   . Pt will continue to exercise regularly and modify diet with low GI, plant based foods and decrease intake of processed foods.  . Recommend intake of daily multivitamin, Vitamin D, and calcium.  . Recommend for preventive screenings, as well as recommend immunizations that include influenza (declined), TDAP (up to date)  2. Encounter for prostate cancer screening  - PSA  3. Vitamin D deficiency  Will check vitamin D level and supplement as needed.     Also encouraged to spend 15 minutes in the sun daily.   Takes an over the counter vitamin d - VITAMIN D 25 Hydroxy (Vit-D Deficiency, Fractures)  4. Mixed hyperlipidemia  Chronic, controlled  Continue with current medications  Continue to avoid fried and fatty foods  5. 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 Schizophrenia, unspecified type ([place])  Chronic, stable  Will send copy of labs to Donata Clay with Dr. Reece Levy [facility] - Lipid panel - Valproic acid level - Lamotrigine level - CBC with Diff - CMP14 + Anion Gap  7. Bilateral impacted  cerumen  Wax is removed by with lavage with elephant ear with 1/2 water and 1/2 peroxide. Instructions for home care to prevent wax buildup are given.   Influenza immunization was not given due to patient refusal.    Minette Brine, FNP    THE PATIENT IS ENCOURAGED TO PRACTICE SOCIAL DISTANCING DUE TO THE COVID-19 PANDEMIC.

## 2019-06-08 NOTE — Patient Instructions (Signed)
Health Maintenance  Topic Date Due  . INFLUENZA VACCINE  12/07/2019 (Originally 04/09/2019)  . HIV Screening  06/07/2020 (Originally 10/02/1988)  . TETANUS/TDAP  04/14/2021    Health Maintenance, Male Adopting a healthy lifestyle and getting preventive care are important in promoting health and wellness. Ask your health care provider about:  The right schedule for you to have regular tests and exams.  Things you can do on your own to prevent diseases and keep yourself healthy. What should I know about diet, weight, and exercise? Eat a healthy diet   Eat a diet that includes plenty of vegetables, fruits, low-fat dairy products, and lean protein.  Do not eat a lot of foods that are high in solid fats, added sugars, or sodium. Maintain a healthy weight Body mass index (BMI) is a measurement that can be used to identify possible weight problems. It estimates body fat based on height and weight. Your health care provider can help determine your BMI and help you achieve or maintain a healthy weight. Get regular exercise Get regular exercise. This is one of the most important things you can do for your health. Most adults should:  Exercise for at least 150 minutes each week. The exercise should increase your heart rate and make you sweat (moderate-intensity exercise).  Do strengthening exercises at least twice a week. This is in addition to the moderate-intensity exercise.  Spend less time sitting. Even light physical activity can be beneficial. Watch cholesterol and blood lipids Have your blood tested for lipids and cholesterol at 45 years of age, then have this test every 5 years. You may need to have your cholesterol levels checked more often if:  Your lipid or cholesterol levels are high.  You are older than 45 years of age.  You are at high risk for heart disease. What should I know about cancer screening? Many types of cancers can be detected early and may often be prevented.  Depending on your health history and family history, you may need to have cancer screening at various ages. This may include screening for:  Colorectal cancer.  Prostate cancer.  Skin cancer.  Lung cancer. What should I know about heart disease, diabetes, and high blood pressure? Blood pressure and heart disease  High blood pressure causes heart disease and increases the risk of stroke. This is more likely to develop in people who have high blood pressure readings, are of African descent, or are overweight.  Talk with your health care provider about your target blood pressure readings.  Have your blood pressure checked: ? Every 3-5 years if you are 26-31 years of age. ? Every year if you are 102 years old or older.  If you are between the ages of 10 and 63 and are a current or former smoker, ask your health care provider if you should have a one-time screening for abdominal aortic aneurysm (AAA). Diabetes Have regular diabetes screenings. This checks your fasting blood sugar level. Have the screening done:  Once every three years after age 45 if you are at a normal weight and have a low risk for diabetes.  More often and at a younger age if you are overweight or have a high risk for diabetes. What should I know about preventing infection? Hepatitis B If you have a higher risk for hepatitis B, you should be screened for this virus. Talk with your health care provider to find out if you are at risk for hepatitis B infection. Hepatitis C Blood testing  is recommended for:  Everyone born from [birth year] through 1967.  Anyone with known risk factors for hepatitis C. Sexually transmitted infections (STIs)  You should be screened each year for STIs, including gonorrhea and chlamydia, if: ? You are sexually active and are younger than 45 years of age. ? You are older than 45 years of age and your health care provider tells you that you are at risk for this type of infection. ? Your sexual  activity has changed since you were last screened, and you are at increased risk for chlamydia or gonorrhea. Ask your health care provider if you are at risk.  Ask your health care provider about whether you are at high risk for HIV. Your health care provider may recommend a prescription medicine to help prevent HIV infection. If you choose to take medicine to prevent HIV, you should first get tested for HIV. You should then be tested every 3 months for as long as you are taking the medicine. Follow these instructions at home: Lifestyle  Do not use any products that contain nicotine or tobacco, such as cigarettes, e-cigarettes, and chewing tobacco. If you need help quitting, ask your health care provider.  Do not use street drugs.  Do not share needles.  Ask your health care provider for help if you need support or information about quitting drugs. Alcohol use  Do not drink alcohol if your health care provider tells you not to drink.  If you drink alcohol: ? Limit how much you have to 0-2 drinks a day. ? Be aware of how much alcohol is in your drink. In the [place] one drink equals one 12 oz bottle of beer (355 mL), one 5 oz glass of wine (148 mL), or one 1 oz glass of hard liquor (44 mL). General instructions  Schedule regular health, dental, and eye exams.  Stay current with your vaccines.  Tell your health care provider if: ? You often feel depressed. ? You have ever been abused or do not feel safe at home. Summary  Adopting a healthy lifestyle and getting preventive care are important in promoting health and wellness.  Follow your health care provider's instructions about healthy diet, exercising, and getting tested or screened for diseases.  Follow your health care provider's instructions on monitoring your cholesterol and blood pressure. This information is not intended to replace advice given to you by your health care provider. Make sure you discuss any questions you have  with your health care provider. Document Released: 02/21/2008 Document Revised: 08/18/2018 Document Reviewed: 08/18/2018 Elsevier Patient Education  2020 [place], Adult The ears produce a substance called earwax that helps keep bacteria out of the ear and protects the skin in the ear canal. Occasionally, earwax can build up in the ear and cause discomfort or hearing loss. What increases the risk? This condition is more likely to develop in people who:  Are male.  Are elderly.  Naturally produce more earwax.  Clean their ears often with cotton swabs.  Use earplugs often.  Use in-ear headphones often.  Wear hearing aids.  Have narrow ear canals.  Have earwax that is overly thick or sticky.  Have eczema.  Are dehydrated.  Have excess hair in the ear canal. What are the signs or symptoms? Symptoms of this condition include:  Reduced or muffled hearing.  A feeling of fullness in the ear or feeling that the ear is plugged.  Fluid coming from the ear.  Ear pain.  Ear itch.  Ringing in the ear.  Coughing.  An obvious piece of earwax that can be seen inside the ear canal. How is this diagnosed? This condition may be diagnosed based on:  Your symptoms.  Your medical history.  An ear exam. During the exam, your health care provider will look into your ear with an instrument called an otoscope. You may have tests, including a hearing test. How is this treated? This condition may be treated by:  Using ear drops to soften the earwax.  Having the earwax removed by a health care provider. The health care provider may: ? Flush the ear with water. ? Use an instrument that has a loop on the end (curette). ? Use a suction device.  Surgery to remove the wax buildup. This may be done in severe cases. Follow these instructions at home:   Take over-the-counter and prescription medicines only as told by your health care provider.  Do not put  any objects, including cotton swabs, into your ear. You can clean the opening of your ear canal with a washcloth or facial tissue.  Follow instructions from your health care provider about cleaning your ears. Do not over-clean your ears.  Drink enough fluid to keep your urine clear or pale yellow. This will help to thin the earwax.  Keep all follow-up visits as told by your health care provider. If earwax builds up in your ears often or if you use hearing aids, consider seeing your health care provider for routine, preventive ear cleanings. Ask your health care provider how often you should schedule your cleanings.  If you have hearing aids, clean them according to instructions from the manufacturer and your health care provider. Contact a health care provider if:  You have ear pain.  You develop a fever.  You have blood, pus, or other fluid coming from your ear.  You have hearing loss.  You have ringing in your ears that does not go away.  Your symptoms do not improve with treatment.  You feel like the room is spinning (vertigo). Summary  Earwax can build up in the ear and cause discomfort or hearing loss.  The most common symptoms of this condition include reduced or muffled hearing and a feeling of fullness in the ear or feeling that the ear is plugged.  This condition may be diagnosed based on your symptoms, your medical history, and an ear exam.  This condition may be treated by using ear drops to soften the earwax or by having the earwax removed by a health care provider.  Do not put any objects, including cotton swabs, into your ear. You can clean the opening of your ear canal with a washcloth or facial tissue. This information is not intended to replace advice given to you by your health care provider. Make sure you discuss any questions you have with your health care provider. Document Released: 10/02/2004 Document Revised: 08/07/2017 Document Reviewed: 11/05/2016  Elsevier Patient Education  2020 [employer].

## 2019-06-09 LAB — CBC WITH DIFFERENTIAL/PLATELET
Basophils Absolute: 0.1 10*3/uL (ref 0.0–0.2)
Basos: 1 %
EOS (ABSOLUTE): 0.1 10*3/uL (ref 0.0–0.4)
Eos: 1 %
Hematocrit: 45.7 % (ref 37.5–51.0)
Hemoglobin: 16.3 g/dL (ref 13.0–17.7)
Immature Grans (Abs): 0.1 10*3/uL (ref 0.0–0.1)
Immature Granulocytes: 1 %
Lymphocytes Absolute: 3 10*3/uL (ref 0.7–3.1)
Lymphs: 26 %
MCH: 32.3 pg (ref 26.6–33.0)
MCHC: 35.7 g/dL (ref 31.5–35.7)
MCV: 91 fL (ref 79–97)
Monocytes Absolute: 0.8 10*3/uL (ref 0.1–0.9)
Monocytes: 7 %
Neutrophils Absolute: 7.3 10*3/uL — ABNORMAL HIGH (ref 1.4–7.0)
Neutrophils: 64 %
Platelets: 213 10*3/uL (ref 150–450)
RBC: 5.05 x10E6/uL (ref 4.14–5.80)
RDW: 12.7 % (ref 11.6–15.4)
WBC: 11.4 10*3/uL — ABNORMAL HIGH (ref 3.4–10.8)

## 2019-06-09 LAB — LIPID PANEL
Chol/HDL Ratio: 4.7 ratio (ref 0.0–5.0)
Cholesterol, Total: 155 mg/dL (ref 100–199)
HDL: 33 mg/dL — ABNORMAL LOW (ref >39)
LDL Chol Calc (NIH): 93 mg/dL (ref 0–99)
Triglycerides: 163 mg/dL — ABNORMAL HIGH (ref 0–149)
VLDL Cholesterol Cal: 29 mg/dL (ref 5–40)

## 2019-06-09 LAB — CMP14 + ANION GAP
ALT: 12 IU/L (ref 0–44)
AST: 16 IU/L (ref 0–40)
Albumin/Globulin Ratio: 1.6 (ref 1.2–2.2)
Albumin: 4.5 g/dL (ref 4.0–5.0)
Alkaline Phosphatase: 85 IU/L (ref 39–117)
Anion Gap: 14 mmol/L (ref 10.0–18.0)
BUN/Creatinine Ratio: 8 — ABNORMAL LOW (ref 9–20)
BUN: 6 mg/dL (ref 6–24)
Bilirubin Total: 0.2 mg/dL (ref 0.0–1.2)
CO2: 23 mmol/L (ref 20–29)
Calcium: 10 mg/dL (ref 8.7–10.2)
Chloride: 103 mmol/L (ref 96–106)
Creatinine, Ser: 0.71 mg/dL — ABNORMAL LOW (ref 0.76–1.27)
GFR calc Af Amer: 131 mL/min/{1.73_m2} (ref >59)
GFR calc non Af Amer: 113 mL/min/{1.73_m2} (ref >59)
Globulin, Total: 2.8 g/dL (ref 1.5–4.5)
Glucose: 88 mg/dL (ref 65–99)
Potassium: 4.2 mmol/L (ref 3.5–5.2)
Sodium: 140 mmol/L (ref 134–144)
Total Protein: 7.3 g/dL (ref 6.0–8.5)

## 2019-06-09 LAB — VALPROIC ACID LEVEL: Valproic Acid Lvl: 67 ug/mL (ref 50–100)

## 2019-06-09 LAB — PSA: Prostate Specific Ag, Serum: 2.5 ng/mL (ref 0.0–4.0)

## 2019-06-09 LAB — LAMOTRIGINE LEVEL: Lamotrigine Lvl: 4.8 ug/mL (ref 2.0–20.0)

## 2019-06-09 LAB — VITAMIN D 25 HYDROXY (VIT D DEFICIENCY, FRACTURES): Vit D, 25-Hydroxy: 78.6 ng/mL (ref 30.0–100.0)

## 2019-07-27 ENCOUNTER — Other Ambulatory Visit: Payer: Self-pay | Admitting: Family Medicine

## 2019-07-27 NOTE — Telephone Encounter (Signed)
Pt is requesting a refill of lamoTRIgine (LAMICTAL) 25 MG tablet, to be sent to [place], [place]

## 2019-07-28 MED ORDER — LAMOTRIGINE 25 MG PO TABS: 50.0000 mg | ORAL_TABLET | Freq: Two times a day (BID) | ORAL | 3 refills | Status: DC

## 2019-07-28 NOTE — Telephone Encounter (Signed)
I called and from 05-2019 note AL/NP pt has been getting his medications from psychiatry.  I LMVM for mother of pt, [name] re: this.  She is to call back if this needs to be changed and psychiatry no longer doing this.

## 2019-07-28 NOTE — Addendum Note (Signed)
Addended by: Oliver Hum S on: 07/28/2019 11:59 AM   Modules accepted: Orders

## 2019-07-28 NOTE — Telephone Encounter (Signed)
I spoke to mother of pt.  Some confusion on who is prescribing sz meds.  She states that she misinformed who prescribed what.  At this time we prescribed sz meds (carloyn last NP did do this).  I placed order for 90 day supply.

## 2019-09-18 ENCOUNTER — Other Ambulatory Visit: Payer: Self-pay | Admitting: Nurse Practitioner

## 2019-09-21 ENCOUNTER — Ambulatory Visit: Payer: Medicaid Other | Attending: Family Medicine | Admitting: Physical Therapy

## 2019-09-21 ENCOUNTER — Encounter: Payer: Self-pay | Admitting: Physical Therapy

## 2019-09-21 ENCOUNTER — Other Ambulatory Visit: Payer: Self-pay

## 2019-09-21 DIAGNOSIS — M25522 Pain in left elbow: Secondary | ICD-10-CM | POA: Insufficient documentation

## 2019-09-21 DIAGNOSIS — M6281 Muscle weakness (generalized): Secondary | ICD-10-CM

## 2019-09-21 DIAGNOSIS — M25532 Pain in left wrist: Secondary | ICD-10-CM | POA: Diagnosis present

## 2019-09-21 NOTE — Patient Instructions (Signed)
Access Code: [email or web address]  URL: [email or web address]  Date: 09/21/2019  Prepared by: Rosana Hoes   Exercises Seated Wrist Extension with Dumbbell - 10 reps - 2 sets - 1x daily - 7x weekly Seated Wrist Flexion with Dumbbell - 10 reps - 2 sets - 1x daily - 7x weekly Standing Bicep Curls Supinated with Dumbbells - 10 reps - 2 sets - 1x daily - 7x weekly Standing Bicep Curls Neutral with Dumbbells - 10 reps - 2 sets - 1x daily - 7x weekly Standing Pronated Elbow Flexion with Dumbbell - 10 reps - 2 sets - 1x daily - 7x weekly Forearm Pronation and Supination with Hammer - 10 reps - 2 sets - 1x daily - 7x weekly Standing Wrist Radial Deviation with Hammer - 10 reps - 2 sets - 1x daily - 7x weekly Standing Wrist Ulnar Deviation with Hammer - 10 reps - 2 sets - 1x daily - 7x weekly Standing Wrist Flexion Stretch - 3 reps - 30 seconds hold - 1x daily - 7x weekly Standing Wrist Extension Stretch - 3 reps - 30 seconds hold - 1x daily - 7x weekly

## 2019-09-21 NOTE — Therapy (Signed)
[facility] [address] [place], [place], [address] Phone: [phone]   Fax:  [phone]  Physical Therapy Evaluation  Patient Details  Name: Micheal Webb MRN: [id number] Date of Birth: 12-10-1975 Referring Provider (PT): Otho Darner, MD   Encounter Date: 09/21/2019  PT End of Session - 09/21/19 1350    Visit Number  1    Number of Visits  12    Date for PT Re-Evaluation  11/02/19   resubmit MCD auth after 4th visit   Authorization Type  MCD    PT Start Time  1355    PT Stop Time  1442    PT Time Calculation (min)  47 min    Activity Tolerance  Patient tolerated treatment well    Behavior During Therapy  [facility] for tasks assessed/performed       Past Medical History:  Diagnosis Date  . Carpal tunnel syndrome on left   . Schizophrenia (HCC)   . Seizure [facility])     Past Surgical History:  Procedure Laterality Date  . None      There were no vitals filed for this visit.   Subjective Assessment - 09/21/19 1351    Subjective  Patient reports he hurt his arm about his year ago. He had a shot at that time and it felt better until it came back about a month ago with no apparent mechanism. He does state that the pain is unpredictable but occurs mainly when he is using the left wrist/arm such as when playing guitar or typing. He describes tingling of the whole hand and pain that runs from the back of the wrist up to the elbow.    Patient is accompained by:  Family member   mother   Pertinent History  Schizophrenia, seizures, smoking    Limitations  House hold activities;Lifting    How long can you sit comfortably?  No limitation    How long can you stand comfortably?  No limitation    How long can you walk comfortably?  No limitation    Patient Stated Goals  Get rid of pain.    Currently in Pain?  Yes    Pain Score  0-No pain    Pain Location  Wrist    Pain Orientation  Left    Pain Descriptors / Indicators   Tingling    Pain Type  Chronic pain    Pain Radiating Towards  forearm to elbow    Pain Onset  More than a month ago    Pain Frequency  Intermittent    Aggravating Factors   Typing, playing guitar    Pain Relieving Factors  Medication    Effect of Pain on Daily Activities  Patient feels he is limited in lifting, typing, hobbies         [facility] PT Assessment - 09/21/19 0001      Assessment   Medical Diagnosis  Left lateral epicondylitis, forearm pain, carpal tunnel    Referring Provider (PT)  Otho Darner, MD    Onset Date/Surgical Date  --   approximately 1 month ago pain came back   Hand Dominance  Right    Next MD Visit  09/23/2019    Prior Therapy  No      Precautions   Precautions  None      Balance Screen   Has the patient fallen in the past 6 months  No    Has the patient had a decrease  in activity level because of a fear of falling?   No    Is the patient reluctant to leave their home because of a fear of falling?   No      Home Environment   Living Environment  Private residence    Living Arrangements  Alone    Type of Home  Apartment      Prior Function   Level of Independence  Independent    Leisure  Guitar, typing      Cognition   Overall Cognitive Status  Within Functional Limits for tasks assessed      Observation/Other Assessments   Observations  Patient appears in no apparent distress, he is accompanied by his mother      Sensation   Light Touch  Appears Intact      Posture/Postural Control   Posture Comments  Patient exhibits rounded shoulder and forward head posture      ROM / Strength   AROM / PROM / Strength  AROM;Strength      AROM   Overall AROM Comments  No pain reported with AROM    AROM Assessment Site  Wrist;Elbow    Right/Left Elbow  Left    Left Elbow Flexion  140    Left Elbow Extension  5   hyper   Right/Left Wrist  Left    Left Wrist Extension  70 Degrees    Left Wrist Flexion  75 Degrees    Left Wrist Radial Deviation   30 Degrees    Left Wrist Ulnar Deviation  40 Degrees      Strength   Overall Strength Comments  Concordant pain not reprduced with muscle testing    Strength Assessment Site  Wrist;Hand;Elbow;Forearm    Right/Left Elbow  Right;Left    Right Elbow Flexion  5/5    Right Elbow Extension  5/5    Left Elbow Flexion  4+/5   in supination, neutral, and pronation   Left Elbow Extension  4+/5    Right/Left Forearm  Right;Left    Right Forearm Pronation  5/5    Right Forearm Supination  5/5    Left Forearm Pronation  4+/5    Left Forearm Supination  4+/5    Right/Left Wrist  Right;Left    Right Wrist Flexion  5/5    Right Wrist Extension  5/5    Right Wrist Radial Deviation  5/5    Right Wrist Ulnar Deviation  5/5    Left Wrist Flexion  4+/5    Left Wrist Extension  4+/5    Left Wrist Radial Deviation  4+/5    Left Wrist Ulnar Deviation  4+/5    Right/Left hand  Right;Left    Right Hand Grip (lbs)  46   kg   Left Hand Grip (lbs)  36   kg     Flexibility   Soft Tissue Assessment /Muscle Length  yes   pt reported stretching with both wrist flex/ext pressure     Palpation   Palpation comment  TTP with notable trigger points along extensor bundle on left      Special Tests   Other special tests  Special tests inconclusive as patient's report of symptoms and aggravating factors were vague      Transfers   Transfers  Independent with all Transfers                Objective measurements completed on examination: See above findings.      [facility] Adult PT  Treatment/Exercise - 09/21/19 0001      Exercises   Exercises  Wrist;Elbow      Elbow Exercises   Elbow Flexion  10 reps    Bar Weights/Barbell (Elbow Flexion)  2 lbs    Elbow Flexion Limitations  performed in supination, neutral, and pronation    Forearm Supination  10 reps    Bar Weights/Barbell (Forearm Supination)  Other (comment)   hammer   Forearm Pronation  10 reps    Bar Weights/Barbell (Forearm Pronation)   Other (comment)   hammer     Wrist Exercises   Wrist Flexion  10 reps    Bar Weights/Barbell (Wrist Flexion)  2 lbs    Wrist Extension  10 reps    Bar Weights/Barbell (Wrist Extension)  2 lbs    Wrist Radial Deviation  10 reps    Bar Weights/Barbell (Radial Deviation)  Other (comment)   hammer   Wrist Ulnar Deviation  10 reps    Bar Weights/Barbell (Ulnar Deviation)  Other (comment)   hammer   Other wrist exercises  --    Other wrist exercises  Wrist flexion/extension stretch with elbow extended x30 sec each      Modalities   Modalities  Electrical Stimulation;Moist Heat      Moist Heat Therapy   Number Minutes Moist Heat  10 Minutes    Moist Heat Location  Other (comment)   forearm     Electrical Stimulation   Electrical Stimulation Location  Left wrist extensor bundle    Electrical Stimulation Action  Premod 80-150 x10 min    Electrical Stimulation Parameters  Patient's tolerance    Electrical Stimulation Goals  Tone;Pain      Manual Therapy   Manual Therapy  Soft tissue mobilization    Soft tissue mobilization  Left wrist extensor bundle             PT Education - 09/21/19 1349    Education Details  Exam findings, POC, HEP    Person(s) Educated  Patient;Parent(s)   mother   Methods  Explanation;Demonstration;Tactile cues;Verbal cues;Handout    Comprehension  Verbalized understanding;Returned demonstration;Verbal cues required;Tactile cues required;Need further instruction       PT Short Term Goals - 09/21/19 1732      PT SHORT TERM GOAL #1   Title  Patient will be I with initial HEP to progress in PT.    Baseline  Given at eval    Time  3    Period  Weeks    Status  New    Target Date  10/12/19      PT SHORT TERM GOAL #2   Title  Patient will report </= 2-3/10 pain level with typing or playing guitar.    Baseline  5/10    Time  3    Period  Weeks    Status  New    Target Date  10/12/19        PT Long Term Goals - 09/21/19 1734      PT  LONG TERM GOAL #1   Title  Patient will exhibit improved strength of 5/5 MMT throughout wrist and elbow to allow for improved activity tolerance.    Baseline  4+/5 MMT    Time  6    Period  Weeks    Status  New    Target Date  11/02/19      PT LONG TERM GOAL #2   Title  Patient will exhibit left grip strength >/= 45  kg to indicate improved lifting ability with left hand    Baseline  36 kg    Time  6    Period  Weeks    Status  New    Target Date  11/02/19      PT LONG TERM GOAL #3   Title  Patient will report </= 0-1/10 pain level with activity to indicated no limitation    Baseline  5/10    Time  6    Period  Weeks    Status  New    Target Date  11/02/19             Plan - 09/21/19 1722    Clinical Impression Statement  Patient presents to PT with report of acute on chronic left wrist and elbow pain. His description of the pain was vague but seems to be mainly located to the posterior aspect of the wrist, forearm, and lateral elbow with tingling on the posterior hand and fingers. He exhibits good range of motion of the wrist, slight strength deficit on the left, with tenderness and trigger points noted on the left extensor bundle that slighly reproduced cocordant pain. He reported imrpovement following modalities and tolerated exercises provided. He would benefit from continued skilled PT to reduce pain so he can return to activity with no limitation.    Personal Factors and Comorbidities  Time since onset of injury/illness/exacerbation;Comorbidity 3+;Past/Current Experience;Behavior Pattern;Transportation;Finances    Comorbidities  Schizophrenia, seizures, smoking    Examination-Activity Limitations  Lift;Other   typing   Examination-Participation Restrictions  Other   Playing guitar, working on computer   Stability/Clinical Decision Making  Stable/Uncomplicated    Clinical Decision Making  Low    Rehab Potential  Good    PT Frequency  2x / week    PT Duration  6 weeks     PT Treatment/Interventions  ADLs/Self Care Home Management;Cryotherapy;Electrical Stimulation;Moist Heat;Neuromuscular re-education;Therapeutic exercise;Therapeutic activities;Patient/family education;Manual techniques;Dry needling;Passive range of motion;Taping;Joint Manipulations    PT Next Visit Plan  Assess HEP and progress PRN, soft tissue and modalities to extensor bundle PRN, progress strengthening, assess and prescribe nerve glides PRN    PT Home Exercise Plan  [id number]; Wrist extension, flexion, pronation, supination, ulnar and radial deviation; bicep curl with forearm in supination, neutral, and pronationl; wrist extension and flexion stretch with elbow extended    Consulted and Agree with Plan of Care  Patient       Patient will benefit from skilled therapeutic intervention in order to improve the following deficits and impairments:  Decreased range of motion, Decreased strength, Postural dysfunction, Impaired flexibility, Pain, Decreased activity tolerance  Visit Diagnosis: Pain in left elbow  Pain in left wrist  Muscle weakness (generalized)     Problem List Patient Active Problem List   Diagnosis Date Noted  . Mixed hyperlipidemia 11/10/2018  . Schizophrenia ([place]) 11/10/2018  . 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 Encounter for therapeutic drug monitoring 05/06/2013    Hilda Blades, PT, DPT, LAT, ATC 09/21/19  5:54 PM Phone: [phone] Fax: [facility] [address] [place], [place], [address] Phone: [phone]   Fax:  [phone]  Name: Micheal Webb MRN: [id number] Date of Birth: 11-Dec-1975

## 2019-09-29 ENCOUNTER — Ambulatory Visit: Payer: Medicaid Other | Admitting: Physical Therapy

## 2019-09-29 ENCOUNTER — Other Ambulatory Visit: Payer: Self-pay

## 2019-09-29 ENCOUNTER — Encounter: Payer: Self-pay | Admitting: Physical Therapy

## 2019-09-29 DIAGNOSIS — M6281 Muscle weakness (generalized): Secondary | ICD-10-CM

## 2019-09-29 DIAGNOSIS — M25532 Pain in left wrist: Secondary | ICD-10-CM

## 2019-09-29 DIAGNOSIS — M25522 Pain in left elbow: Secondary | ICD-10-CM

## 2019-09-29 NOTE — Therapy (Signed)
[place], [place], [address] Phone: [phone]   Fax:  [phone]  Physical Therapy Treatment  Patient Details  Name: Micheal Webb MRN: [id number] Date of Birth: 23-Apr-1976 Referring Provider (PT): Gentry Fitz, MD   Encounter Date: 09/29/2019  PT End of Session - 09/29/19 0843    Visit Number  2    Number of Visits  12    Date for PT Re-Evaluation  11/02/19    Authorization Type  MCD    PT Start Time  0831    PT Stop Time  0915    PT Time Calculation (min)  44 min    Activity Tolerance  Patient tolerated treatment well    Behavior During Therapy  [facility] for tasks assessed/performed       Past Medical History:  Diagnosis Date  . Carpal tunnel syndrome on left   . Schizophrenia ([place])   . Seizure [facility])     Past Surgical History:  Procedure Laterality Date  . None      There were no vitals filed for this visit.  Subjective Assessment - 09/29/19 0840    Subjective  Patient reports he feels he is getting better, he is not taking medication. He does notice pain on occasions. He has not been doing any of his exercises.    Patient Stated Goals  Get rid of pain.    Currently in Pain?  No/denies                       [facility] Adult PT Treatment/Exercise - 09/29/19 0001      Exercises   Exercises  Wrist;Elbow      Elbow Exercises   Elbow Flexion  10 reps   3 sets   Bar Weights/Barbell (Elbow Flexion)  5 lbs    Elbow Flexion Limitations  1 set performed in supination, neutral, and pronation    Forearm Supination  10 reps   2 sets   Bar Weights/Barbell (Forearm Supination)  Other (comment)   hammer (1 lb ankle weigh)   Forearm Pronation  10 reps   2 sets   Bar Weights/Barbell (Forearm Pronation)  Other (comment)   hammer (1 lb ankle weigh)     Wrist Exercises   Wrist Flexion  10 reps   2 sets   Bar Weights/Barbell (Wrist Flexion)  3 lbs    Wrist Extension  10  reps   2 sets   Bar Weights/Barbell (Wrist Extension)  3 lbs    Wrist Radial Deviation  10 reps    Bar Weights/Barbell (Radial Deviation)  Other (comment)   hammer (1 lb ankle weigh)   Wrist Ulnar Deviation  10 reps    Bar Weights/Barbell (Ulnar Deviation)  Other (comment)   hammer (1 lb ankle weigh)   Other wrist exercises  Grip Digi-flex 5 lbs 2x10    Other wrist exercises  Wrist flexion/extension stretch with elbow extended 2x30 sec each      Modalities   Modalities  Electrical Stimulation;Moist Heat      Moist Heat Therapy   Number Minutes Moist Heat  10 Minutes    Moist Heat Location  Other (comment)   forearm     Electrical Stimulation   Electrical Stimulation Location  Left wrist extensor bundle    Electrical Stimulation Action  Premod 80-150 x10 min    Electrical Stimulation Parameters  Patient's tolerance    Electrical Stimulation Goals  Pain  Manual Therapy   Manual Therapy  Soft tissue mobilization    Soft tissue mobilization  Left wrist extensor bundle             PT Education - 09/29/19 0842    Education Details  HEP    Person(s) Educated  Patient    Methods  Explanation;Demonstration;Verbal cues    Comprehension  Verbalized understanding;Returned demonstration;Verbal cues required;Need further instruction       PT Short Term Goals - 09/21/19 1732      PT SHORT TERM GOAL #1   Title  Patient will be I with initial HEP to progress in PT.    Baseline  Given at eval    Time  3    Period  Weeks    Status  New    Target Date  10/12/19      PT SHORT TERM GOAL #2   Title  Patient will report </= 2-3/10 pain level with typing or playing guitar.    Baseline  5/10    Time  3    Period  Weeks    Status  New    Target Date  10/12/19        PT Long Term Goals - 09/21/19 1734      PT LONG TERM GOAL #1   Title  Patient will exhibit improved strength of 5/5 MMT throughout wrist and elbow to allow for improved activity tolerance.    Baseline  4+/5  MMT    Time  6    Period  Weeks    Status  New    Target Date  11/02/19      PT LONG TERM GOAL #2   Title  Patient will exhibit left grip strength >/= 45 kg to indicate improved lifting ability with left hand    Baseline  36 kg    Time  6    Period  Weeks    Status  New    Target Date  11/02/19      PT LONG TERM GOAL #3   Title  Patient will report </= 0-1/10 pain level with activity to indicated no limitation    Baseline  5/10    Time  6    Period  Weeks    Status  New    Target Date  11/02/19            Plan - 09/29/19 0844    Clinical Impression Statement  Patient seems to be doing well and was able to tolerate greater resistance with exercises this visit. He reported he felt better following the modalities and manual this visit. He did not report any increased pain this visit. He would benefit from continued skilled PT to progress his strength and tolerance to activity.    PT Treatment/Interventions  ADLs/Self Care Home Management;Cryotherapy;Electrical Stimulation;Moist Heat;Neuromuscular re-education;Therapeutic exercise;Therapeutic activities;Patient/family education;Manual techniques;Dry needling;Passive range of motion;Taping;Joint Manipulations    PT Next Visit Plan  Assess HEP and progress PRN, soft tissue and modalities to extensor bundle PRN, progress strengthening, assess and prescribe nerve glides PRN    PT Home Exercise Plan  [id number]; Wrist extension, flexion, pronation, supination, ulnar and radial deviation; bicep curl with forearm in supination, neutral, and pronationl; wrist extension and flexion stretch with elbow extended    Consulted and Agree with Plan of Care  Patient       Patient will benefit from skilled therapeutic intervention in order to improve the following deficits and impairments:  Decreased range of motion, Decreased  strength, Postural dysfunction, Impaired flexibility, Pain, Decreased activity tolerance  Visit Diagnosis: Pain in left  elbow  Pain in left wrist  Muscle weakness (generalized)     Problem List Patient Active Problem List   Diagnosis Date Noted  . Mixed hyperlipidemia 11/10/2018  . Schizophrenia (HCC) 11/10/2018  . 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 Encounter for therapeutic drug monitoring 05/06/2013    Rosana Hoes, PT, DPT, LAT, ATC 09/29/19  9:20 AM Phone: [phone] Fax: [phone]   [facility] [address] [place], [place], [address] Phone: [phone]   Fax:  [phone]  Name: Micheal Webb MRN: [id number] Date of Birth: Dec 31, 1975

## 2019-10-10 ENCOUNTER — Ambulatory Visit: Payer: Medicaid Other | Admitting: Physical Therapy

## 2019-10-12 ENCOUNTER — Ambulatory Visit: Payer: Medicaid Other | Attending: Family Medicine | Admitting: Physical Therapy

## 2019-10-12 ENCOUNTER — Other Ambulatory Visit: Payer: Self-pay

## 2019-10-12 ENCOUNTER — Encounter: Payer: Self-pay | Admitting: Physical Therapy

## 2019-10-12 DIAGNOSIS — M6281 Muscle weakness (generalized): Secondary | ICD-10-CM | POA: Diagnosis present

## 2019-10-12 DIAGNOSIS — M25532 Pain in left wrist: Secondary | ICD-10-CM | POA: Diagnosis present

## 2019-10-12 DIAGNOSIS — M25522 Pain in left elbow: Secondary | ICD-10-CM | POA: Diagnosis present

## 2019-10-12 NOTE — Therapy (Signed)
[place], [place], [address] Phone: [phone]   Fax:  [phone]  Physical Therapy Treatment  Patient Details  Name: Micheal Webb MRN: [id number] Date of Birth: 07/26/1976 Referring Provider (PT): Gentry Fitz, MD   Encounter Date: 10/12/2019  PT End of Session - 10/12/19 1534    Visit Number  3    Number of Visits  12    Date for PT Re-Evaluation  11/02/19    Authorization Type  MCD    Authorization Time Period  09/29/2019 - 10/12/2019    Authorization - Visit Number  2    Authorization - Number of Visits  3    PT Start Time  9024    PT Stop Time  1442    PT Time Calculation (min)  49 min    Activity Tolerance  Patient tolerated treatment well    Behavior During Therapy  [facility] for tasks assessed/performed       Past Medical History:  Diagnosis Date  . Carpal tunnel syndrome on left   . Schizophrenia ([place])   . Seizure [facility])     Past Surgical History:  Procedure Laterality Date  . None      There were no vitals filed for this visit.  Subjective Assessment - 10/12/19 1405    Subjective  Patient had to cancel his last appointment due to inclement weather. He reports his pain comes and goes, sometimes it will get tingly and occurs a lot in the morning. He has been able to play the guitar and type more. He has not done any of the exercises at home.    Patient Stated Goals  Get rid of pain.    Currently in Pain?  No/denies    Pain Location  Wrist    Pain Orientation  Left    Pain Descriptors / Indicators  Tingling    Pain Type  Chronic pain    Pain Radiating Towards  forearm to elbow    Pain Onset  More than a month ago    Pain Frequency  Intermittent    Aggravating Factors   Typing, playing guitar    Pain Relieving Factors  Rest    Effect of Pain on Daily Activities  Patient feels he is limited in lifting, typing, hobbies         [facility] PT Assessment - 10/12/19 0001      Assessment   Medical Diagnosis  Left lateral epicondylitis, forearm pain, carpal tunnel    Referring Provider (PT)  Gentry Fitz, MD    Hand Dominance  Right      Balance Screen   Has the patient fallen in the past 6 months  No      Prior Function   Level of Independence  Independent    Leisure  Guitar, typing      Observation/Other Assessments   Focus on Therapeutic Outcomes (FOTO)   NA - MCD      Strength   Right Elbow Flexion  5/5    Right Elbow Extension  5/5    Left Elbow Flexion  4+/5   in supination, neutral, and pronation   Left Elbow Extension  5/5    Right Wrist Flexion  5/5    Right Wrist Extension  5/5    Right Wrist Radial Deviation  5/5    Right Wrist Ulnar Deviation  5/5    Left Wrist Flexion  4+/5    Left Wrist Extension  4+/5    Left Wrist Radial Deviation  4+/5    Left Wrist Ulnar Deviation  4+/5    Right Hand Grip (lbs)  47   kg   Left Hand Grip (lbs)  40   kg                  [facility] Adult PT Treatment/Exercise - 10/12/19 0001      Exercises   Exercises  Wrist;Elbow      Elbow Exercises   Elbow Flexion  10 reps    Bar Weights/Barbell (Elbow Flexion)  Other (comment)   6 lbs   Elbow Flexion Limitations  2 sets each performed in supination, neutral, and pronation    Forearm Supination  10 reps   2 sets   Bar Weights/Barbell (Forearm Supination)  Other (comment)   hammer (1 lb ankle weight)   Forearm Pronation  10 reps   2 sets   Bar Weights/Barbell (Forearm Pronation)  Other (comment)   hammer (1 lb ankle weight)   Other elbow exercises  Standing row and extension with red band 2x20 each      Wrist Exercises   Wrist Flexion  10 reps   3 sets   Bar Weights/Barbell (Wrist Flexion)  4 lbs;3 lbs    Wrist Extension  10 reps   3 sets   Bar Weights/Barbell (Wrist Extension)  4 lbs;3 lbs    Wrist Radial Deviation  10 reps    Bar Weights/Barbell (Radial Deviation)  Other (comment)   hammer (1 lb ankle weight)   Wrist Ulnar  Deviation  10 reps    Bar Weights/Barbell (Ulnar Deviation)  Other (comment)   hammer (1 lb ankle weight)   Other wrist exercises  Grip Digi-flex 5 lbs 2x20, Finger extension with Xtrainer green 2x10    Other wrist exercises  Wrist flexion/extension stretch with elbow extended 2x30 sec each      Modalities   Modalities  Electrical Stimulation;Moist Heat      Moist Heat Therapy   Number Minutes Moist Heat  10 Minutes    Moist Heat Location  Other (comment)      Electrical Stimulation   Electrical Stimulation Location  Left wrist extensor bundle    Electrical Stimulation Action  Premod 80-150 x10 min    Electrical Stimulation Parameters  Patient's tolerance    Electrical Stimulation Goals  Pain             PT Education - 10/12/19 1533    Education Details  HEP consistency    Person(s) Educated  Patient    Methods  Explanation;Demonstration;Verbal cues    Comprehension  Verbalized understanding;Returned demonstration;Verbal cues required;Need further instruction       PT Short Term Goals - 10/12/19 1542      PT SHORT TERM GOAL #1   Title  Patient will be I with initial HEP to progress in PT.    Baseline  Given at eval    Time  3    Period  Weeks    Status  On-going    Target Date  10/12/19      PT SHORT TERM GOAL #2   Title  Patient will report </= 2-3/10 pain level with typing or playing guitar.    Baseline  depends on time he plays, has improved    Time  3    Period  Weeks    Status  Partially Met    Target Date  10/12/19  PT Long Term Goals - 09/21/19 1734      PT LONG TERM GOAL #1   Title  Patient will exhibit improved strength of 5/5 MMT throughout wrist and elbow to allow for improved activity tolerance.    Baseline  4+/5 MMT    Time  6    Period  Weeks    Status  New    Target Date  11/02/19      PT LONG TERM GOAL #2   Title  Patient will exhibit left grip strength >/= 45 kg to indicate improved lifting ability with left hand    Baseline   36 kg    Time  6    Period  Weeks    Status  New    Target Date  11/02/19      PT LONG TERM GOAL #3   Title  Patient will report </= 0-1/10 pain level with activity to indicated no limitation    Baseline  5/10    Time  6    Period  Weeks    Status  New    Target Date  11/02/19            Plan - 10/12/19 1536    Clinical Impression Statement  Patient seems to be improving with his left forearm, wrist, and hand pain and tingling. He does continues to report pain and tingling with prolonged activities and in the morning, and he continues to exhibit slight elbow and wrist weakness compared to opposite side. He has not been consistent with his exercises at home so he was encouraged to work more on these so he can progress his strength and activity tolerance in order to reduce pain. Periscapular and rotator cuff strengthening was incorporated this visit with good tolerance and no increase in pain level. He would benefit from continued skilled PT to progress his strength and tolerance for extended periods of activity in order to reduce pain and limitation with activities such as typing and playing guitar.    Personal Factors and Comorbidities  Time since onset of injury/illness/exacerbation;Comorbidity 3+;Past/Current Experience;Behavior Pattern;Transportation;Finances    Comorbidities  Schizophrenia, seizures, smoking    Examination-Activity Limitations  Lift;Other   typing   Examination-Participation Restrictions  Other   guitar, computer   PT Frequency  2x / week    PT Duration  6 weeks    PT Treatment/Interventions  ADLs/Self Care Home Management;Cryotherapy;Electrical Stimulation;Moist Heat;Neuromuscular re-education;Therapeutic exercise;Therapeutic activities;Patient/family education;Manual techniques;Dry needling;Passive range of motion;Taping;Joint Manipulations    PT Next Visit Plan  Assess HEP and progress PRN, soft tissue and modalities to extensor bundle PRN, progress  strengthening, assess and prescribe nerve glides PRN    PT Home Exercise Plan  [id number]; Wrist extension, flexion, pronation, supination, ulnar and radial deviation; bicep curl with forearm in supination, neutral, and pronationl; wrist extension and flexion stretch with elbow extended    Consulted and Agree with Plan of Care  Patient       Patient will benefit from skilled therapeutic intervention in order to improve the following deficits and impairments:  Decreased range of motion, Decreased strength, Postural dysfunction, Impaired flexibility, Pain, Decreased activity tolerance  Visit Diagnosis: Pain in left elbow  Pain in left wrist  Muscle weakness (generalized)     Problem List Patient Active Problem List   Diagnosis Date Noted  . Mixed hyperlipidemia 11/10/2018  . Schizophrenia ([place]) 11/10/2018  . 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 Encounter for therapeutic drug monitoring 05/06/2013    Hilda Blades,  PT, DPT, LAT, ATC 10/12/19  3:50 PM Phone: [phone] Fax: [place] [facility] [address] [place], [place], [address] Phone: [phone]   Fax:  [phone]  Name: Micheal Webb MRN: [id number] Date of Birth: 12-08-1975

## 2019-10-18 ENCOUNTER — Encounter: Payer: Self-pay | Admitting: Physical Therapy

## 2019-10-18 ENCOUNTER — Other Ambulatory Visit: Payer: Self-pay

## 2019-10-18 ENCOUNTER — Ambulatory Visit: Payer: Medicaid Other | Admitting: Physical Therapy

## 2019-10-18 DIAGNOSIS — M6281 Muscle weakness (generalized): Secondary | ICD-10-CM

## 2019-10-18 DIAGNOSIS — M25532 Pain in left wrist: Secondary | ICD-10-CM

## 2019-10-18 DIAGNOSIS — M25522 Pain in left elbow: Secondary | ICD-10-CM

## 2019-10-18 NOTE — Patient Instructions (Signed)
Access Code: [email or web address]  URL: [email or web address]  Date: 10/18/2019  Prepared by: Rosana Hoes   Exercises Seated Wrist Extension with Dumbbell - 10 reps - 2 sets - 1x daily - 7x weekly Seated Wrist Flexion with Dumbbell - 10 reps - 2 sets - 1x daily - 7x weekly Standing Bicep Curls Supinated with Dumbbells - 10 reps - 2 sets                            - 1x daily - 7x weekly Standing Bicep Curls Neutral with Dumbbells - 10 reps - 2 sets - 1x daily - 7x weekly Standing Pronated Elbow Flexion with Dumbbell - 10 reps - 2 sets                            - 1x daily - 7x weekly Forearm Pronation and Supination with Hammer - 10 reps - 2 sets - 1x daily - 7x weekly Standing Wrist Radial Deviation with Hammer - 10 reps - 2 sets - 1x daily - 7x weekly Standing Wrist Ulnar Deviation with Hammer - 10 reps - 2 sets - 1x daily - 7x weekly Standing Wrist Flexion Stretch - 3 reps - 30 seconds hold - 1x daily - 7x weekly Standing Wrist Extension Stretch - 3 reps - 30 seconds hold - 1x daily - 7x weekly Putty Squeezes - 20 reps - 3 sets - 7x weekly

## 2019-10-18 NOTE — Therapy (Signed)
[place], [place], [address] Phone: [phone]   Fax:  [phone]  Physical Therapy Treatment  Patient Details  Name: Micheal Webb MRN: [id number] Date of Birth: 12-Nov-1975 Referring Provider (PT): Gentry Fitz, MD   Encounter Date: 10/18/2019  PT End of Session - 10/18/19 1011    Visit Number  4    Number of Visits  12    Date for PT Re-Evaluation  11/02/19    Authorization Type  MCD    Authorization Time Period  10/18/2019 - 11/21/2019    Authorization - Visit Number  1    Authorization - Number of Visits  8    PT Start Time  1001    PT Stop Time  1043    PT Time Calculation (min)  42 min    Activity Tolerance  Patient tolerated treatment well    Behavior During Therapy  [facility] for tasks assessed/performed       Past Medical History:  Diagnosis Date  . Carpal tunnel syndrome on left   . Schizophrenia ([place])   . Seizure [facility])     Past Surgical History:  Procedure Laterality Date  . None      There were no vitals filed for this visit.  Subjective Assessment - 10/18/19 1009    Subjective  Patient reports he is doing alright this visit.    Currently in Pain?  No/denies                       [facility] Adult PT Treatment/Exercise - 10/18/19 0001      Exercises   Exercises  Wrist;Elbow      Elbow Exercises   Elbow Flexion Limitations  8# 2x12 each performed in supination, neutral, and pronation    Bar Weights/Barbell (Forearm Supination)  Other (comment)   hammer (1 lb ankle weight)   Forearm Supination Limitations  2x12     Bar Weights/Barbell (Forearm Pronation)  Other (comment)   hammer (1 lb ankle weight)   Forearm Pronation Limitations  2x12    Other elbow exercises  Standing row (green) and extension (red) band 2x20 each    Other elbow exercises  UBE x 4 min level 2 (2 fwd/bwd)      Wrist Exercises   Bar Weights/Barbell (Wrist Flexion)  4 lbs    Wrist  Flexion Limitations  3x12    Bar Weights/Barbell (Wrist Extension)  4 lbs    Wrist Extension Limitations  3x12    Bar Weights/Barbell (Radial Deviation)  Other (comment)   hammer (1 lb ankle weight)   Wrist Radial Deviation Limitations  3x12    Bar Weights/Barbell (Ulnar Deviation)  Other (comment)   hammer (1 lb ankle weight)   Wrist Ulnar Deviation Limitations  3x12    Other wrist exercises  Gripping with yellow putty              PT Education - 10/18/19 1010    Education Details  HEP consistency    Person(s) Educated  Patient    Methods  Explanation;Demonstration;Verbal cues    Comprehension  Verbalized understanding;Returned demonstration;Verbal cues required;Need further instruction       PT Short Term Goals - 10/12/19 1542      PT SHORT TERM GOAL #1   Title  Patient will be I with initial HEP to progress in PT.    Baseline  Given at eval    Time  3  Period  Weeks    Status  On-going    Target Date  10/12/19      PT SHORT TERM GOAL #2   Title  Patient will report </= 2-3/10 pain level with typing or playing guitar.    Baseline  depends on time he plays, has improved    Time  3    Period  Weeks    Status  Partially Met    Target Date  10/12/19        PT Long Term Goals - 09/21/19 1734      PT LONG TERM GOAL #1   Title  Patient will exhibit improved strength of 5/5 MMT throughout wrist and elbow to allow for improved activity tolerance.    Baseline  4+/5 MMT    Time  6    Period  Weeks    Status  New    Target Date  11/02/19      PT LONG TERM GOAL #2   Title  Patient will exhibit left grip strength >/= 45 kg to indicate improved lifting ability with left hand    Baseline  36 kg    Time  6    Period  Weeks    Status  New    Target Date  11/02/19      PT LONG TERM GOAL #3   Title  Patient will report </= 0-1/10 pain level with activity to indicated no limitation    Baseline  5/10    Time  6    Period  Weeks    Status  New    Target Date   11/02/19            Plan - 10/18/19 1042    Clinical Impression Statement  Patient is progressing well with his strengthening exercises and did not report any increased pain this visit. He does continues to exhibit strength deficit of left compared to right and fatigues quickly with exercises. He would benefit from continued skilled PT to progress his strength and tolerance for extended periods of activity in order to reduce pain and limitation with activities such as typing and playing guitar.    Personal Factors and Comorbidities  Time since onset of injury/illness/exacerbation;Comorbidity 3+;Past/Current Experience;Behavior Pattern;Transportation;Finances    Comorbidities  Schizophrenia, seizures, smoking    Examination-Activity Limitations  Lift;Other   typing   Examination-Participation Restrictions  Other   guitar, computer   PT Frequency  2x / week    PT Duration  6 weeks    PT Treatment/Interventions  ADLs/Self Care Home Management;Cryotherapy;Electrical Stimulation;Moist Heat;Neuromuscular re-education;Therapeutic exercise;Therapeutic activities;Patient/family education;Manual techniques;Dry needling;Passive range of motion;Taping;Joint Manipulations    PT Next Visit Plan  Assess HEP and progress PRN, soft tissue and modalities to extensor bundle PRN, progress strengthening, assess and prescribe nerve glides PRN    PT Home Exercise Plan  [id number]; Wrist extension, flexion, pronation, supination, ulnar and radial deviation; bicep curl with forearm in supination, neutral, and pronationl; wrist extension and flexion stretch with elbow extended, putty grip squeezes    Consulted and Agree with Plan of Care  Patient       Patient will benefit from skilled therapeutic intervention in order to improve the following deficits and impairments:  Decreased range of motion, Decreased strength, Postural dysfunction, Impaired flexibility, Pain, Decreased activity tolerance  Visit  Diagnosis: Pain in left elbow  Pain in left wrist  Muscle weakness (generalized)     Problem List Patient Active Problem List   Diagnosis Date Noted  .  Mixed hyperlipidemia 11/10/2018  . Schizophrenia ([place]) 11/10/2018  . 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 Encounter for therapeutic drug monitoring 05/06/2013    Hilda Blades, PT, DPT, LAT, ATC 10/18/19  11:26 AM Phone: [phone] Fax: [place] [facility] [address] [place], [place], [address] Phone: [phone]   Fax:  [phone]  Name: Micheal Webb MRN: [id number] Date of Birth: 12-01-1975

## 2019-10-20 ENCOUNTER — Ambulatory Visit: Payer: Medicaid Other | Admitting: Physical Therapy

## 2019-10-20 ENCOUNTER — Other Ambulatory Visit: Payer: Self-pay

## 2019-10-20 ENCOUNTER — Encounter: Payer: Self-pay | Admitting: Physical Therapy

## 2019-10-20 DIAGNOSIS — M25532 Pain in left wrist: Secondary | ICD-10-CM

## 2019-10-20 DIAGNOSIS — M25522 Pain in left elbow: Secondary | ICD-10-CM

## 2019-10-20 DIAGNOSIS — M6281 Muscle weakness (generalized): Secondary | ICD-10-CM

## 2019-10-20 NOTE — Therapy (Signed)
[place], [place], [address] Phone: [phone]   Fax:  [phone]  Physical Therapy Treatment  Patient Details  Name: Micheal Webb MRN: [id number] Date of Birth: [birth year] Referring Provider (PT): Gentry Fitz, MD   Encounter Date: 10/20/2019  PT End of Session - 10/20/19 1008    Visit Number  5    Number of Visits  12    Date for PT Re-Evaluation  11/02/19    Authorization Type  MCD    Authorization Time Period  10/18/2019 - 11/21/2019    Authorization - Visit Number  2    Authorization - Number of Visits  8    PT Start Time  1001    PT Stop Time  1040    PT Time Calculation (min)  39 min    Activity Tolerance  Patient tolerated treatment well    Behavior During Therapy  [facility] for tasks assessed/performed       Past Medical History:  Diagnosis Date  . Carpal tunnel syndrome on left   . Schizophrenia ([place])   . Seizure [facility])     Past Surgical History:  Procedure Laterality Date  . None      There were no vitals filed for this visit.  Subjective Assessment - 10/20/19 1005    Subjective  Patient reports he is doing well. No complaints.    Currently in Pain?  No/denies                       [facility] Adult PT Treatment/Exercise - 10/20/19 0001      Elbow Exercises   Elbow Flexion Limitations  8# 2x15 each performed in supination, neutral, and pronation    Bar Weights/Barbell (Forearm Supination)  Other (comment)   hammer (1 lb ankle weight)   Forearm Supination Limitations  2x15    Bar Weights/Barbell (Forearm Pronation)  Other (comment)   hammer (1 lb ankle weight)   Forearm Pronation Limitations  2x15    Other elbow exercises  Row with green 2x20, double ER with yellow 2x20    Other elbow exercises  UBE x 8 min level 2 (4 fwd/bwd)      Wrist Exercises   Bar Weights/Barbell (Wrist Flexion)  4 lbs    Wrist Flexion Limitations  3x15    Bar Weights/Barbell  (Wrist Extension)  4 lbs    Wrist Extension Limitations  3x15    Bar Weights/Barbell (Radial Deviation)  Other (comment)   hammer (1 lb ankle weight)   Wrist Radial Deviation Limitations  3x15    Bar Weights/Barbell (Ulnar Deviation)  Other (comment)   hammer (1 lb ankle weight)   Wrist Ulnar Deviation Limitations  3x15    Other wrist exercises  Grip Digi-flex 5 lbs 3x20             PT Education - 10/20/19 1008    Education Details  HEP    Person(s) Educated  Patient    Methods  Explanation;Demonstration;Verbal cues    Comprehension  Verbalized understanding;Returned demonstration;Verbal cues required;Need further instruction       PT Short Term Goals - 10/20/19 1012      PT SHORT TERM GOAL #1   Title  Patient will be I with initial HEP to progress in PT.    Baseline  Given at eval    Time  3    Period  Weeks    Status  On-going  Target Date  10/12/19      PT SHORT TERM GOAL #2   Title  Patient will report </= 2-3/10 pain level with typing or playing guitar.    Baseline  depends on time he plays, has improved    Time  3    Period  Weeks    Status  Achieved    Target Date  10/12/19        PT Long Term Goals - 09/21/19 1734      PT LONG TERM GOAL #1   Title  Patient will exhibit improved strength of 5/5 MMT throughout wrist and elbow to allow for improved activity tolerance.    Baseline  4+/5 MMT    Time  6    Period  Weeks    Status  New    Target Date  11/02/19      PT LONG TERM GOAL #2   Title  Patient will exhibit left grip strength >/= 45 kg to indicate improved lifting ability with left hand    Baseline  36 kg    Time  6    Period  Weeks    Status  New    Target Date  11/02/19      PT LONG TERM GOAL #3   Title  Patient will report </= 0-1/10 pain level with activity to indicated no limitation    Baseline  5/10    Time  6    Period  Weeks    Status  New    Target Date  11/02/19            Plan - 10/20/19 1009    Clinical Impression  Statement  Patient is progressing well and reports improvement in his symptoms. He is tolerating the strengthening exercises well without any increased pain during therapy. The number of repetitions were increased this visit to work on endurance and progressing activity tolerance. He does continue to exhibit strength and endurance deficit of his left compared to right. He would benefit from continued skilled PT to progress his strength and tolerance for extended periods of activity in order to reduce pain and limitation with activities such as typing and playing guitar.    PT Treatment/Interventions  ADLs/Self Care Home Management;Cryotherapy;Electrical Stimulation;Moist Heat;Neuromuscular re-education;Therapeutic exercise;Therapeutic activities;Patient/family education;Manual techniques;Dry needling;Passive range of motion;Taping;Joint Manipulations    PT Next Visit Plan  Assess HEP and progress PRN, soft tissue and modalities to extensor bundle PRN, progress strengthening, assess and prescribe nerve glides PRN    PT Home Exercise Plan  [id number]; Wrist extension, flexion, pronation, supination, ulnar and radial deviation; bicep curl with forearm in supination, neutral, and pronationl; wrist extension and flexion stretch with elbow extended, putty grip squeezes    Consulted and Agree with Plan of Care  Patient       Patient will benefit from skilled therapeutic intervention in order to improve the following deficits and impairments:  Decreased range of motion, Decreased strength, Postural dysfunction, Impaired flexibility, Pain, Decreased activity tolerance  Visit Diagnosis: Pain in left elbow  Pain in left wrist  Muscle weakness (generalized)     Problem List Patient Active Problem List   Diagnosis Date Noted  . Mixed hyperlipidemia 11/10/2018  . Schizophrenia ([facility]) 11/10/2018  . 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 Encounter for therapeutic drug monitoring 05/06/2013     Rosana Hoes, PT, DPT, LAT, ATC 10/20/19  10:43 AM Phone: [phone] Fax: [phone]   [facility] [address]  [place], [place], [address] Phone: [phone]   Fax:  [phone]  Name: Micheal Webb MRN: [id number] Date of Birth: [birth year]

## 2019-10-26 ENCOUNTER — Other Ambulatory Visit: Payer: Self-pay

## 2019-10-26 ENCOUNTER — Encounter: Payer: Self-pay | Admitting: Physical Therapy

## 2019-10-26 ENCOUNTER — Ambulatory Visit: Payer: Medicaid Other | Admitting: Physical Therapy

## 2019-10-26 DIAGNOSIS — M25522 Pain in left elbow: Secondary | ICD-10-CM

## 2019-10-26 DIAGNOSIS — M25532 Pain in left wrist: Secondary | ICD-10-CM

## 2019-10-26 DIAGNOSIS — M6281 Muscle weakness (generalized): Secondary | ICD-10-CM

## 2019-10-26 NOTE — Therapy (Signed)
[place], [place], [address] Phone: [phone]   Fax:  [phone]  Physical Therapy Treatment  Patient Details  Name: Micheal Webb MRN: [id number] Date of Birth: [birth year] Referring Provider (PT): Gentry Fitz, MD   Encounter Date: 10/26/2019  PT End of Session - 10/26/19 0917    Visit Number  6    Number of Visits  12    Date for PT Re-Evaluation  11/02/19    Authorization Type  MCD    Authorization Time Period  10/18/2019 - 11/21/2019    Authorization - Visit Number  3    Authorization - Number of Visits  8    PT Start Time  0912    PT Stop Time  0951    PT Time Calculation (min)  39 min    Activity Tolerance  Patient tolerated treatment well    Behavior During Therapy  [facility] for tasks assessed/performed       Past Medical History:  Diagnosis Date  . Carpal tunnel syndrome on left   . Schizophrenia ([place])   . Seizure [facility])     Past Surgical History:  Procedure Laterality Date  . None      There were no vitals filed for this visit.  Subjective Assessment - 10/26/19 0916    Subjective  Patient reports he is improving, his arm hasn't really been bothering him too much.    Patient Stated Goals  Get rid of pain.    Currently in Pain?  No/denies                       [facility] Adult PT Treatment/Exercise - 10/26/19 0001      Exercises   Exercises  Wrist;Elbow      Elbow Exercises   Elbow Flexion Limitations  8# 2x10 each performed in supination, neutral, and pronation    Bar Weights/Barbell (Forearm Supination)  Other (comment)   hammer (1 lb ankle weight)   Forearm Supination Limitations  2x15    Bar Weights/Barbell (Forearm Pronation)  Other (comment)   hammer (1 lb ankle weight)   Forearm Pronation Limitations  2x15    Other elbow exercises  Row with green 2x20, double ER with yellow 2x20    Other elbow exercises  UBE x 6 min level 2 (3 fwd/bwd)      Wrist  Exercises   Bar Weights/Barbell (Wrist Flexion)  4 lbs    Wrist Flexion Limitations  3x15    Bar Weights/Barbell (Wrist Extension)  4 lbs    Wrist Extension Limitations  3x15    Bar Weights/Barbell (Radial Deviation)  Other (comment)   hammer (1 lb ankle weight)   Wrist Radial Deviation Limitations  3x15    Bar Weights/Barbell (Ulnar Deviation)  Other (comment)   hammer (1 lb ankle weight)   Wrist Ulnar Deviation Limitations  3x15    Other wrist exercises  Grip Digi-flex 5 lbs 2x20    Other wrist exercises  Standing shoulder flexion, scaption, abduction with 4 lbs x10 each             PT Education - 10/26/19 0917    Education Details  HEP    Person(s) Educated  Patient    Methods  Explanation;Demonstration;Verbal cues    Comprehension  Verbalized understanding;Returned demonstration;Verbal cues required;Need further instruction       PT Short Term Goals - 10/20/19 1012      PT SHORT TERM GOAL #  1   Title  Patient will be I with initial HEP to progress in PT.    Baseline  Given at eval    Time  3    Period  Weeks    Status  On-going    Target Date  10/12/19      PT SHORT TERM GOAL #2   Title  Patient will report </= 2-3/10 pain level with typing or playing guitar.    Baseline  depends on time he plays, has improved    Time  3    Period  Weeks    Status  Achieved    Target Date  10/12/19        PT Long Term Goals - 09/21/19 1734      PT LONG TERM GOAL #1   Title  Patient will exhibit improved strength of 5/5 MMT throughout wrist and elbow to allow for improved activity tolerance.    Baseline  4+/5 MMT    Time  6    Period  Weeks    Status  New    Target Date  11/02/19      PT LONG TERM GOAL #2   Title  Patient will exhibit left grip strength >/= 45 kg to indicate improved lifting ability with left hand    Baseline  36 kg    Time  6    Period  Weeks    Status  New    Target Date  11/02/19      PT LONG TERM GOAL #3   Title  Patient will report </=  0-1/10 pain level with activity to indicated no limitation    Baseline  5/10    Time  6    Period  Weeks    Status  New    Target Date  11/02/19            Plan - 10/26/19 1610    Clinical Impression Statement  Patient continues to progress well with his strengthening and seems to be improving in regard to left forearm pain. More shoulder strengthening has been incorporated into rehab to work on posture, and he is tolerating greater resistance with wrist/elbow strenghtening. He was encouraged to be more consistent with HEP to improve activity tolerance. He would benefit from continued skilled PT to progress his strength and tolerance for extended periods of activity in order to reduce pain and limitation with activities such as typing and playing guitar.    PT Treatment/Interventions  ADLs/Self Care Home Management;Cryotherapy;Electrical Stimulation;Moist Heat;Neuromuscular re-education;Therapeutic exercise;Therapeutic activities;Patient/family education;Manual techniques;Dry needling;Passive range of motion;Taping;Joint Manipulations    PT Next Visit Plan  Assess HEP and progress PRN, soft tissue and modalities to extensor bundle PRN, progress strengthening, assess and prescribe nerve glides PRN    PT Home Exercise Plan  [id number]; Wrist extension, flexion, pronation, supination, ulnar and radial deviation; bicep curl with forearm in supination, neutral, and pronationl; wrist extension and flexion stretch with elbow extended, putty grip squeezes    Consulted and Agree with Plan of Care  Patient       Patient will benefit from skilled therapeutic intervention in order to improve the following deficits and impairments:  Decreased range of motion, Decreased strength, Postural dysfunction, Impaired flexibility, Pain, Decreased activity tolerance  Visit Diagnosis: Pain in left elbow  Pain in left wrist  Muscle weakness (generalized)     Problem List Patient Active Problem List    Diagnosis Date Noted  . Mixed hyperlipidemia 11/10/2018  . Schizophrenia (HCC) 11/10/2018  .  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 Encounter for therapeutic drug monitoring 05/06/2013    Rosana Hoes, PT, DPT, LAT, ATC 10/26/19  9:58 AM Phone: [phone] Fax: [phone]   [facility] Outpatient Rehabilitation [facility] [address] [place], [place], [address] Phone: [phone]   Fax:  [phone]  Name: KYLEY LAUREL MRN: [id number] Date of Birth: [birth year]

## 2019-10-28 ENCOUNTER — Ambulatory Visit: Payer: Medicaid Other | Admitting: Physical Therapy

## 2019-10-28 ENCOUNTER — Encounter: Payer: Self-pay | Admitting: Physical Therapy

## 2019-10-28 ENCOUNTER — Other Ambulatory Visit: Payer: Self-pay

## 2019-10-28 DIAGNOSIS — M25522 Pain in left elbow: Secondary | ICD-10-CM | POA: Diagnosis not present

## 2019-10-28 DIAGNOSIS — M25532 Pain in left wrist: Secondary | ICD-10-CM

## 2019-10-28 DIAGNOSIS — M6281 Muscle weakness (generalized): Secondary | ICD-10-CM

## 2019-10-28 NOTE — Therapy (Signed)
[place], [place], [address] Phone: [phone]   Fax:  [phone]  Physical Therapy Treatment  Patient Details  Name: Micheal Webb MRN: [id number] Date of Birth: 1975-10-24 Referring Provider (PT): Gentry Fitz, MD   Encounter Date: 10/28/2019  PT End of Session - 10/28/19 1055    Visit Number  7    Number of Visits  12    Date for PT Re-Evaluation  11/02/19    Authorization Type  MCD    Authorization Time Period  10/18/2019 - 11/21/2019    Authorization - Visit Number  4    Authorization - Number of Visits  8    PT Start Time  1046    PT Stop Time  1127    PT Time Calculation (min)  41 min    Activity Tolerance  Patient tolerated treatment well    Behavior During Therapy  [facility] for tasks assessed/performed       Past Medical History:  Diagnosis Date  . Carpal tunnel syndrome on left   . Schizophrenia ([place])   . Seizure [facility])     Past Surgical History:  Procedure Laterality Date  . None      There were no vitals filed for this visit.  Subjective Assessment - 10/28/19 1054    Subjective  Patient reports he is doing well and feels he is improving.    Patient Stated Goals  Get rid of pain.    Currently in Pain?  No/denies         [facility] PT Assessment - 10/28/19 0001      Assessment   Medical Diagnosis  Left lateral epicondylitis, forearm pain, carpal tunnel    Referring Provider (PT)  Gentry Fitz, MD                   [facility] Adult PT Treatment/Exercise - 10/28/19 0001      Exercises   Exercises  Wrist;Elbow      Elbow Exercises   Elbow Flexion Limitations  8# 2x10 each performed in supination, neutral, and pronation    Bar Weights/Barbell (Forearm Supination)  Other (comment)   hammer (1 lb ankle weight)   Forearm Supination Limitations  2x15    Bar Weights/Barbell (Forearm Pronation)  Other (comment)   hammer (1 lb ankle weight)   Forearm Pronation  Limitations  2x15    Other elbow exercises  Row with blue 2x20, extension with red 2x20, double ER with yellow 2x20    Other elbow exercises  UBE x 6 min level 3 (3 fwd/bwd)      Wrist Exercises   Bar Weights/Barbell (Wrist Flexion)  4 lbs    Wrist Flexion Limitations  3x15    Bar Weights/Barbell (Wrist Extension)  4 lbs    Wrist Extension Limitations  3x15    Bar Weights/Barbell (Radial Deviation)  Other (comment)   hammer (1 lb ankle weight)   Wrist Radial Deviation Limitations  3x15    Bar Weights/Barbell (Ulnar Deviation)  Other (comment)   hammer (1 lb ankle weight)   Wrist Ulnar Deviation Limitations  3x15    Other wrist exercises  Grip Digi-flex 5 lbs 2x20    Other wrist exercises  Standing shoulder flexion, abduction with 4 lbs 2x10 each             PT Education - 10/28/19 1055    Education Details  HEP    Person(s) Educated  Patient  Methods  Explanation;Demonstration;Verbal cues    Comprehension  Verbalized understanding;Returned demonstration;Verbal cues required;Need further instruction       PT Short Term Goals - 10/20/19 1012      PT SHORT TERM GOAL #1   Title  Patient will be I with initial HEP to progress in PT.    Baseline  Given at eval    Time  3    Period  Weeks    Status  On-going    Target Date  10/12/19      PT SHORT TERM GOAL #2   Title  Patient will report </= 2-3/10 pain level with typing or playing guitar.    Baseline  depends on time he plays, has improved    Time  3    Period  Weeks    Status  Achieved    Target Date  10/12/19        PT Long Term Goals - 09/21/19 1734      PT LONG TERM GOAL #1   Title  Patient will exhibit improved strength of 5/5 MMT throughout wrist and elbow to allow for improved activity tolerance.    Baseline  4+/5 MMT    Time  6    Period  Weeks    Status  New    Target Date  11/02/19      PT LONG TERM GOAL #2   Title  Patient will exhibit left grip strength >/= 45 kg to indicate improved lifting  ability with left hand    Baseline  36 kg    Time  6    Period  Weeks    Status  New    Target Date  11/02/19      PT LONG TERM GOAL #3   Title  Patient will report </= 0-1/10 pain level with activity to indicated no limitation    Baseline  5/10    Time  6    Period  Weeks    Status  New    Target Date  11/02/19            Plan - 10/28/19 1056    Clinical Impression Statement  Patient is progressing well with strengthening exercises but does continue to exhibit strength and endurance deficit leading to his activity tolerance with typing and guitar. He does exhibit improved pain level and ability to perform activity for longer durations before pain occurs. He requires cueing for exercise technique and he is being encouraged to perform exercise at least 1 other time per week in addition to his therapy to progress at home. He would benefit from continued skilled PT to progress his strength and tolerance for extended periods of activity in order to reduce pain and limitation with activities such as typing and playing guitar.    PT Treatment/Interventions  ADLs/Self Care Home Management;Cryotherapy;Electrical Stimulation;Moist Heat;Neuromuscular re-education;Therapeutic exercise;Therapeutic activities;Patient/family education;Manual techniques;Dry needling;Passive range of motion;Taping;Joint Manipulations    PT Next Visit Plan  Assess HEP and progress PRN, soft tissue and modalities to extensor bundle PRN, progress strengthening, assess and prescribe nerve glides PRN    PT Home Exercise Plan  [id number]; Wrist extension, flexion, pronation, supination, ulnar and radial deviation; bicep curl with forearm in supination, neutral, and pronationl; wrist extension and flexion stretch with elbow extended, putty grip squeezes    Consulted and Agree with Plan of Care  Patient       Patient will benefit from skilled therapeutic intervention in order to improve the following deficits and impairments:  Decreased range of motion, Decreased strength, Postural dysfunction, Impaired flexibility, Pain, Decreased activity tolerance  Visit Diagnosis: Pain in left elbow  Pain in left wrist  Muscle weakness (generalized)     Problem List Patient Active Problem List   Diagnosis Date Noted  . Mixed hyperlipidemia 11/10/2018  . Schizophrenia (HCC) 11/10/2018  . 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 Encounter for therapeutic drug monitoring 05/06/2013    Rosana Hoes, PT, DPT, LAT, ATC 10/28/19  11:31 AM Phone: [phone] Fax: [phone]   [facility] Outpatient Rehabilitation [facility] [address] [place], [place], [address] Phone: [phone]   Fax:  [phone]  Name: Micheal Webb MRN: [id number] Date of Birth: 12/15/1975

## 2019-11-01 ENCOUNTER — Other Ambulatory Visit: Payer: Self-pay

## 2019-11-01 ENCOUNTER — Ambulatory Visit: Payer: Medicaid Other | Admitting: Physical Therapy

## 2019-11-01 ENCOUNTER — Encounter: Payer: Self-pay | Admitting: Physical Therapy

## 2019-11-01 DIAGNOSIS — M25522 Pain in left elbow: Secondary | ICD-10-CM | POA: Diagnosis not present

## 2019-11-01 DIAGNOSIS — M25532 Pain in left wrist: Secondary | ICD-10-CM

## 2019-11-01 DIAGNOSIS — M6281 Muscle weakness (generalized): Secondary | ICD-10-CM

## 2019-11-01 NOTE — Therapy (Signed)
[place], [place], [address] Phone: [phone]   Fax:  [phone]  Physical Therapy Treatment  Progress Note Reporting Period 09/21/2019 to 11/01/2019  See note below for Objective Data and Assessment of Progress/Goals.     Patient Details  Name: Micheal Webb MRN: [id number] Date of Birth: 1976/04/02 Referring Provider (PT): Gentry Fitz, MD   Encounter Date: 11/01/2019  PT End of Session - 11/01/19 1727    Visit Number  8    Number of Visits  12    Date for PT Re-Evaluation  11/29/19    Authorization Type  MCD    Authorization Time Period  10/18/2019 - 11/21/2019    Authorization - Visit Number  5    Authorization - Number of Visits  8    PT Start Time  1601    PT Stop Time  1644    PT Time Calculation (min)  43 min    Activity Tolerance  Patient tolerated treatment well    Behavior During Therapy  [facility] for tasks assessed/performed       Past Medical History:  Diagnosis Date  . Carpal tunnel syndrome on left   . Schizophrenia ([place])   . Seizure [facility])     Past Surgical History:  Procedure Laterality Date  . None      There were no vitals filed for this visit.  Subjective Assessment - 11/01/19 1726    Subjective  Patient reports continued improvement.    Patient Stated Goals  Get rid of pain.    Currently in Pain?  Yes         [facility] PT Assessment - 11/01/19 0001      Assessment   Medical Diagnosis  Left lateral epicondylitis, forearm pain, carpal tunnel    Referring Provider (PT)  Gentry Fitz, MD    Hand Dominance  Right      AROM   Overall AROM Comments  No pain reported with AROM      Strength   Left Elbow Flexion  5/5    Left Elbow Extension  5/5    Left Forearm Pronation  5/5    Left Forearm Supination  5/5    Left Wrist Flexion  5/5    Left Wrist Extension  5/5    Left Wrist Radial Deviation  5/5    Left Wrist Ulnar Deviation  5/5    Right Hand  Grip (lbs)  46   kg   Left Hand Grip (lbs)  42   kg     Palpation   Palpation comment  No pain with palpation                   [facility] Adult PT Treatment/Exercise - 11/01/19 0001      Exercises   Exercises  Wrist;Elbow      Elbow Exercises   Elbow Flexion Limitations  8# 2x10 each performed in supination, neutral, and pronation    Bar Weights/Barbell (Forearm Supination)  Other (comment)   hammer (1 lb ankle weight)   Forearm Supination Limitations  2x15    Bar Weights/Barbell (Forearm Pronation)  Other (comment)   hammer (1 lb ankle weight)   Forearm Pronation Limitations  2x15    Other elbow exercises  Row with blue 2x20, extension with red 2x20, double ER with red 2x20    Other elbow exercises  UBE x 4 min level 3 (3 fwd/bwd)      Wrist  Exercises   Bar Weights/Barbell (Wrist Flexion)  4 lbs    Wrist Flexion Limitations  3x15    Bar Weights/Barbell (Wrist Extension)  4 lbs    Wrist Extension Limitations  3x15    Bar Weights/Barbell (Radial Deviation)  Other (comment)   hammer (1 lb ankle weight)   Wrist Radial Deviation Limitations  2x15    Bar Weights/Barbell (Ulnar Deviation)  Other (comment)   hammer (1 lb ankle weight)   Wrist Ulnar Deviation Limitations  2x15    Other wrist exercises  Standing shoulder flexion, abduction with 4 lbs 2x10 each             PT Education - 11/01/19 1726    Education Details  HEP    Person(s) Educated  Patient    Methods  Explanation;Demonstration;Verbal cues    Comprehension  Verbalized understanding;Returned demonstration;Verbal cues required;Need further instruction       PT Short Term Goals - 10/20/19 1012      PT SHORT TERM GOAL #1   Title  Patient will be I with initial HEP to progress in PT.    Baseline  Given at eval    Time  3    Period  Weeks    Status  On-going    Target Date  10/12/19      PT SHORT TERM GOAL #2   Title  Patient will report </= 2-3/10 pain level with typing or playing guitar.     Baseline  depends on time he plays, has improved    Time  3    Period  Weeks    Status  Achieved    Target Date  10/12/19        PT Long Term Goals - 11/01/19 1728      PT LONG TERM GOAL #1   Title  Patient will exhibit improved strength of 5/5 MMT throughout wrist and elbow to allow for improved activity tolerance.    Baseline  4+/5 MMT    Time  6    Period  Weeks    Status  Achieved      PT LONG TERM GOAL #2   Title  Patient will exhibit left grip strength >/= 45 kg to indicate improved lifting ability with left hand    Baseline  42    Time  6    Period  Weeks    Status  Partially Met      PT LONG TERM GOAL #3   Title  Patient will report </= 0-1/10 pain level with activity to indicated no limitation    Baseline  Patient denies pain with typing, he hasn't pushed it yet    Time  6    Period  Weeks    Status  Partially Met            Plan - 11/01/19 1728    Clinical Impression Statement  Patient is progressing well and reports improved pain level and he exhibits improved strength. He does continue to demonstrate deficit in grip strength and he reports greater fatigue on left with activity. He was encouraged to increase activity level for left elbow/wrist and in consistency with exercises. He would benefit from continued skilled PT to progress his strength and tolerance for extended periods of activity in order to reduce pain and limitation with activities such as typing and playing guitar.    PT Frequency  2x / week    PT Duration  4 weeks    PT Treatment/Interventions  ADLs/Self Care Home Management;Cryotherapy;Electrical Stimulation;Moist Heat;Neuromuscular re-education;Therapeutic exercise;Therapeutic activities;Patient/family education;Manual techniques;Dry needling;Passive range of motion;Taping;Joint Manipulations    PT Next Visit Plan  Assess HEP and progress PRN, soft tissue and modalities to extensor bundle PRN, progress strengthening, assess and prescribe nerve  glides PRN    PT Home Exercise Plan  [id number]; Wrist extension, flexion, pronation, supination, ulnar and radial deviation; bicep curl with forearm in supination, neutral, and pronationl; wrist extension and flexion stretch with elbow extended, putty grip squeezes    Consulted and Agree with Plan of Care  Patient       Patient will benefit from skilled therapeutic intervention in order to improve the following deficits and impairments:  Decreased range of motion, Decreased strength, Postural dysfunction, Impaired flexibility, Pain, Decreased activity tolerance  Visit Diagnosis: Pain in left elbow  Pain in left wrist  Muscle weakness (generalized)     Problem List Patient Active Problem List   Diagnosis Date Noted  . Mixed hyperlipidemia 11/10/2018  . Schizophrenia ([place]) 11/10/2018  . Generalized convulsive epilepsy ([place]) 05/06/2013  . Encounter for therapeutic drug monitoring 05/06/2013    Hilda Blades, PT, DPT, LAT, ATC 11/01/19  5:47 PM Phone: [phone] Fax: [facility] [address] [place], [place], [address] Phone: [phone]   Fax:  [phone]  Name: Micheal Webb MRN: [id number] Date of Birth: 05/25/1976

## 2019-11-03 ENCOUNTER — Other Ambulatory Visit: Payer: Self-pay

## 2019-11-03 ENCOUNTER — Encounter: Payer: Self-pay | Admitting: Physical Therapy

## 2019-11-03 ENCOUNTER — Ambulatory Visit: Payer: Medicaid Other | Admitting: Physical Therapy

## 2019-11-03 DIAGNOSIS — M25522 Pain in left elbow: Secondary | ICD-10-CM | POA: Diagnosis not present

## 2019-11-03 DIAGNOSIS — M25532 Pain in left wrist: Secondary | ICD-10-CM

## 2019-11-03 DIAGNOSIS — M6281 Muscle weakness (generalized): Secondary | ICD-10-CM

## 2019-11-03 NOTE — Patient Instructions (Signed)
Access Code: [email or web address]  URL: [email or web address]  Date: 11/03/2019  Prepared by: Rosana Hoes   Exercises Seated Wrist Extension with Dumbbell - 10 reps - 2 sets - 1x daily Seated Wrist Flexion with Dumbbell - 10 reps - 2 sets - 1x daily Standing Bicep Curls Supinated with Dumbbells - 10 reps - 2 sets                            - 1x daily Standing Bicep Curls Neutral with Dumbbells - 10 reps - 2 sets - 1x daily Standing Pronated Elbow Flexion with Dumbbell - 10 reps - 2 sets                            - 1x daily Forearm Pronation and Supination with Hammer - 10 reps - 2 sets - 1x daily Standing Wrist Radial Deviation with Hammer - 10 reps - 2 sets - 1x daily Standing Wrist Ulnar Deviation with Hammer - 10 reps - 2 sets - 1x daily Standing Wrist Flexion Stretch - 3 reps - 30 seconds hold - 1x daily Standing Wrist Extension Stretch - 3 reps - 30 seconds hold - 1x daily Putty Squeezes - 20 reps - 3 sets Standing Row with Anchored Resistance - 20 reps - 2 sets - 1x daily Shoulder External Rotation and Scapular Retraction with Resistance - 20 reps - 2 sets - 1x daily

## 2019-11-03 NOTE — Therapy (Signed)
[place], [place], [address] Phone: [phone]   Fax:  [phone]  Physical Therapy Treatment  Patient Details  Name: Micheal Webb MRN: [id number] Date of Birth: 12-11-1975 Referring Provider (PT): Gentry Fitz, MD   Encounter Date: 11/03/2019  PT End of Session - 11/03/19 1410    Visit Number  9    Number of Visits  12    Date for PT Re-Evaluation  11/29/19    Authorization Type  MCD    Authorization Time Period  10/18/2019 - 11/21/2019    Authorization - Visit Number  6    Authorization - Number of Visits  8    PT Start Time  1400    PT Stop Time  1440    PT Time Calculation (min)  40 min    Activity Tolerance  Patient tolerated treatment well    Behavior During Therapy  [facility] for tasks assessed/performed       Past Medical History:  Diagnosis Date  . Carpal tunnel syndrome on left   . Schizophrenia ([place])   . Seizure [facility])     Past Surgical History:  Procedure Laterality Date  . None      There were no vitals filed for this visit.  Subjective Assessment - 11/03/19 1409    Subjective  Patient reports he is doing well. He denies any soreness from last visit.    Patient Stated Goals  Get rid of pain.    Currently in Pain?  No/denies                       [facility] Adult PT Treatment/Exercise - 11/03/19 0001      Exercises   Exercises  Wrist;Elbow      Elbow Exercises   Elbow Flexion Limitations  8# 2x10 each performed in supination, neutral, and pronation    Bar Weights/Barbell (Forearm Supination)  Other (comment)   hammer (2 lb ankle weight)   Forearm Supination Limitations  2x20    Bar Weights/Barbell (Forearm Pronation)  Other (comment)   hammer (2 lb ankle weight)   Forearm Pronation Limitations  2x20    Other elbow exercises  Row with blue 2x20, extension with green 2x20, double ER with green 2x20    Other elbow exercises  UBE x 4 min level 3 (3  fwd/bwd)      Wrist Exercises   Bar Weights/Barbell (Wrist Flexion)  4 lbs    Wrist Flexion Limitations  2x20    Bar Weights/Barbell (Wrist Extension)  4 lbs    Wrist Extension Limitations  2x20    Bar Weights/Barbell (Radial Deviation)  Other (comment)   hammer (1 lb ankle weight)   Wrist Radial Deviation Limitations  2x20    Bar Weights/Barbell (Ulnar Deviation)  Other (comment)   hammer (1 lb ankle weight)   Wrist Ulnar Deviation Limitations  2x20    Other wrist exercises  Standing shoulder flexion, abduction with 4 lbs 2x10 each             PT Education - 11/03/19 1409    Education Details  HEP    Person(s) Educated  Patient    Methods  Explanation;Demonstration;Verbal cues    Comprehension  Verbalized understanding;Returned demonstration;Verbal cues required;Need further instruction       PT Short Term Goals - 10/20/19 1012      PT SHORT TERM GOAL #1   Title  Patient will be I with  initial HEP to progress in PT.    Baseline  Given at eval    Time  3    Period  Weeks    Status  On-going    Target Date  10/12/19      PT SHORT TERM GOAL #2   Title  Patient will report </= 2-3/10 pain level with typing or playing guitar.    Baseline  depends on time he plays, has improved    Time  3    Period  Weeks    Status  Achieved    Target Date  10/12/19        PT Long Term Goals - 11/01/19 1728      PT LONG TERM GOAL #1   Title  Patient will exhibit improved strength of 5/5 MMT throughout wrist and elbow to allow for improved activity tolerance.    Baseline  4+/5 MMT    Time  6    Period  Weeks    Status  Achieved      PT LONG TERM GOAL #2   Title  Patient will exhibit left grip strength >/= 45 kg to indicate improved lifting ability with left hand    Baseline  42    Time  6    Period  Weeks    Status  Partially Met      PT LONG TERM GOAL #3   Title  Patient will report </= 0-1/10 pain level with activity to indicated no limitation    Baseline  Patient  denies pain with typing, he hasn't pushed it yet    Time  6    Period  Weeks    Status  Partially Met            Plan - 11/03/19 1411    Clinical Impression Statement  Patient continues to do well and progressing with wrist, elbow, and shoulder strengthening. His endurance is slowly improving as he is reporting and exhibiting less fatigue on the left side. He would benefit from continued skilled PT to progress his strength and tolerance for extended periods of activity in order to reduce pain and limitation with activities such as typing and playing guitar.    PT Treatment/Interventions  ADLs/Self Care Home Management;Cryotherapy;Electrical Stimulation;Moist Heat;Neuromuscular re-education;Therapeutic exercise;Therapeutic activities;Patient/family education;Manual techniques;Dry needling;Passive range of motion;Taping;Joint Manipulations    PT Next Visit Plan  Assess HEP and progress PRN, soft tissue and modalities to extensor bundle PRN, progress strengthening, assess and prescribe nerve glides PRN    PT Home Exercise Plan  [id number]; Wrist extension, flexion, pronation, supination, ulnar and radial deviation; bicep curl with forearm in supination, neutral, and pronation; wrist extension and flexion stretch with elbow extended, putty grip squeezes, banded row and double ER with green    Consulted and Agree with Plan of Care  Patient       Patient will benefit from skilled therapeutic intervention in order to improve the following deficits and impairments:  Decreased range of motion, Decreased strength, Postural dysfunction, Impaired flexibility, Pain, Decreased activity tolerance  Visit Diagnosis: Pain in left elbow  Pain in left wrist  Muscle weakness (generalized)     Problem List Patient Active Problem List   Diagnosis Date Noted  . Mixed hyperlipidemia 11/10/2018  . Schizophrenia ([place]) 11/10/2018  . 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 Encounter for  therapeutic drug monitoring 05/06/2013    Hilda Blades, PT, DPT, LAT, ATC 11/03/19  2:41 PM Phone: [phone] Fax: [facility] [address] [place], [place], [address] Phone: [phone]   Fax:  [phone]  Name: Micheal Webb MRN: [id number] Date of Birth: 1976-08-29

## 2019-11-07 ENCOUNTER — Other Ambulatory Visit: Payer: Self-pay

## 2019-11-07 ENCOUNTER — Encounter: Payer: Self-pay | Admitting: Physical Therapy

## 2019-11-07 ENCOUNTER — Ambulatory Visit: Payer: Medicaid Other | Attending: Family Medicine | Admitting: Physical Therapy

## 2019-11-07 DIAGNOSIS — M25532 Pain in left wrist: Secondary | ICD-10-CM | POA: Diagnosis not present

## 2019-11-07 DIAGNOSIS — M25522 Pain in left elbow: Secondary | ICD-10-CM | POA: Diagnosis present

## 2019-11-07 DIAGNOSIS — M6281 Muscle weakness (generalized): Secondary | ICD-10-CM | POA: Insufficient documentation

## 2019-11-07 NOTE — Therapy (Signed)
[place], [place], [address] Phone: [phone]   Fax:  [phone]  Physical Therapy Treatment  Patient Details  Name: Micheal Webb MRN: [id number] Date of Birth: [birth year] Referring Provider (PT): Gentry Fitz, MD   Encounter Date: 11/07/2019  PT End of Session - 11/07/19 1121    Visit Number  10    Number of Visits  12    Date for PT Re-Evaluation  11/29/19    Authorization Type  MCD    Authorization Time Period  10/18/2019 - 11/21/2019    Authorization - Visit Number  7    Authorization - Number of Visits  8    PT Start Time  1125    PT Stop Time  1205    PT Time Calculation (min)  40 min    Activity Tolerance  Patient tolerated treatment well    Behavior During Therapy  [facility] for tasks assessed/performed       Past Medical History:  Diagnosis Date  . Carpal tunnel syndrome on left   . Schizophrenia ([place])   . Seizure [facility])     Past Surgical History:  Procedure Laterality Date  . None      There were no vitals filed for this visit.  Subjective Assessment - 11/07/19 1120    Subjective  Patient reports he is doing well. He denies any soreness from last visit.    Patient Stated Goals  Get rid of pain.    Currently in Pain?  No/denies                       [facility] Adult PT Treatment/Exercise - 11/07/19 0001      Exercises   Exercises  Wrist;Elbow      Elbow Exercises   Elbow Flexion Limitations  8# 2x10 each performed in supination, neutral, and pronation    Bar Weights/Barbell (Forearm Supination)  Other (comment)   hammer (2 lb ankle weight)   Forearm Supination Limitations  2x20    Bar Weights/Barbell (Forearm Pronation)  Other (comment)   hammer (2 lb ankle weight)   Forearm Pronation Limitations  2x20    Other elbow exercises  Row with blue 2x20, extension with green 2x20, double ER with green 2x20, tricep push-down with green 2x20    Other elbow  exercises  UBE x 4 min level 3 (2 fwd/bwd)      Wrist Exercises   Bar Weights/Barbell (Wrist Flexion)  5 lbs    Wrist Flexion Limitations  2x20    Bar Weights/Barbell (Wrist Extension)  5 lbs    Wrist Extension Limitations  2x20    Bar Weights/Barbell (Radial Deviation)  Other (comment)   hammer (1 lb ankle weight)   Wrist Radial Deviation Limitations  2x20    Bar Weights/Barbell (Ulnar Deviation)  Other (comment)   hammer (1 lb ankle weight)   Wrist Ulnar Deviation Limitations  2x20    Other wrist exercises  Standing shoulder flexion, abduction with 4 lbs 2x15 each             PT Education - 11/07/19 1120    Education Details  HEP    Person(s) Educated  Patient    Methods  Explanation;Demonstration;Verbal cues    Comprehension  Verbalized understanding;Returned demonstration;Verbal cues required;Need further instruction       PT Short Term Goals - 10/20/19 1012      PT SHORT TERM GOAL #1   Title  Patient will be I with initial HEP to progress in PT.    Baseline  Given at eval    Time  3    Period  Weeks    Status  On-going    Target Date  10/12/19      PT SHORT TERM GOAL #2   Title  Patient will report </= 2-3/10 pain level with typing or playing guitar.    Baseline  depends on time he plays, has improved    Time  3    Period  Weeks    Status  Achieved    Target Date  10/12/19        PT Long Term Goals - 11/07/19 1130      PT LONG TERM GOAL #1   Title  Patient will exhibit improved strength of 5/5 MMT throughout wrist and elbow to allow for improved activity tolerance.    Baseline  4+/5 MMT    Time  6    Period  Weeks    Status  Achieved    Target Date  11/29/19      PT LONG TERM GOAL #2   Title  Patient will exhibit left grip strength >/= 45 kg to indicate improved lifting ability with left hand    Baseline  42    Time  6    Period  Weeks    Status  Partially Met    Target Date  11/29/19      PT LONG TERM GOAL #3   Title  Patient will report  </= 0-1/10 pain level with activity to indicated no limitation    Baseline  Patient denies pain with typing, he hasn't pushed it yet    Time  6    Period  Weeks    Status  Partially Met    Target Date  11/29/19            Plan - 11/07/19 1122    Clinical Impression Statement  Patient is continuing to progress well with exercises and seems to continue to improve with less fatigue reported on left. He tolerated greater resistance with wrist/elbow strengthening and did not report any pain this visit. He was encouraged to work on consistency with exercises. He would benefit from continued skilled PT to progress his strength and tolerance for extended periods of activity in order to reduce pain and limitation with activities such as typing and playing guitar.    PT Treatment/Interventions  ADLs/Self Care Home Management;Cryotherapy;Electrical Stimulation;Moist Heat;Neuromuscular re-education;Therapeutic exercise;Therapeutic activities;Patient/family education;Manual techniques;Dry needling;Passive range of motion;Taping;Joint Manipulations    PT Next Visit Plan  Assess HEP and progress PRN, soft tissue and modalities to extensor bundle PRN, progress strengthening, assess and prescribe nerve glides PRN    PT Home Exercise Plan  [id number]; Wrist extension, flexion, pronation, supination, ulnar and radial deviation; bicep curl with forearm in supination, neutral, and pronation; wrist extension and flexion stretch with elbow extended, putty grip squeezes, banded row and double ER with green    Consulted and Agree with Plan of Care  Patient       Patient will benefit from skilled therapeutic intervention in order to improve the following deficits and impairments:  Decreased range of motion, Decreased strength, Postural dysfunction, Impaired flexibility, Pain, Decreased activity tolerance  Visit Diagnosis: Pain in left wrist  Pain in left elbow  Muscle weakness (generalized)     Problem  List Patient Active Problem List   Diagnosis Date Noted  . Mixed hyperlipidemia 11/10/2018  .  Schizophrenia ([place]) 11/10/2018  . 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 Encounter for therapeutic drug monitoring 05/06/2013    Hilda Blades, PT, DPT, LAT, ATC 11/07/19  12:05 PM Phone: [phone] Fax: [facility] [address] [place], [place], [address] Phone: [phone]   Fax:  [phone]  Name: SAAGAR TORTORELLA MRN: [id number] Date of Birth: 03-Feb-1976

## 2019-11-09 ENCOUNTER — Ambulatory Visit: Payer: Medicaid Other | Admitting: Physical Therapy

## 2019-11-09 ENCOUNTER — Other Ambulatory Visit: Payer: Self-pay

## 2019-11-09 ENCOUNTER — Encounter: Payer: Self-pay | Admitting: Physical Therapy

## 2019-11-09 DIAGNOSIS — M25532 Pain in left wrist: Secondary | ICD-10-CM | POA: Diagnosis not present

## 2019-11-09 DIAGNOSIS — M25522 Pain in left elbow: Secondary | ICD-10-CM

## 2019-11-09 DIAGNOSIS — M6281 Muscle weakness (generalized): Secondary | ICD-10-CM

## 2019-11-09 NOTE — Therapy (Addendum)
[place], [place], [address] Phone: [phone]   Fax:  [phone]  Physical Therapy Treatment  Patient Details  Name: Micheal Webb MRN: [id number] Date of Birth: 1975/11/26 Referring Provider (PT): Gentry Fitz, MD   Encounter Date: 11/09/2019  PT End of Session - 11/09/19 1416    Visit Number  11    Number of Visits  12    Date for PT Re-Evaluation  11/29/19    Authorization Type  MCD    Authorization Time Period  10/18/2019 - 11/21/2019    Authorization - Visit Number  8    Authorization - Number of Visits  8    PT Start Time  6962    PT Stop Time  1436    PT Time Calculation (min)  40 min    Activity Tolerance  Patient tolerated treatment well    Behavior During Therapy  [facility] for tasks assessed/performed       Past Medical History:  Diagnosis Date  . Carpal tunnel syndrome on left   . Schizophrenia ([place])   . Seizure [facility])     Past Surgical History:  Procedure Laterality Date  . None      There were no vitals filed for this visit.  Subjective Assessment - 11/09/19 1415    Subjective  Patient denies any pain or other issues. He does note increased discomfrot (2-3/10) when playing guitar or typing for extended periods of time.    Patient Stated Goals  Get rid of pain.    Currently in Pain?  No/denies    Aggravating Factors   Typing, playing guitar         [facility] PT Assessment - 11/09/19 0001      Assessment   Medical Diagnosis  Left lateral epicondylitis, forearm pain, carpal tunnel    Referring Provider (PT)  Gentry Fitz, MD    Onset Date/Surgical Date  09/07/19   date of PT referral   Hand Dominance  Right      AROM   Overall AROM Comments  Full AROM or wrist and elbow, no pain reported      Strength   Left Elbow Flexion  5/5    Left Elbow Extension  5/5    Left Forearm Pronation  5/5    Left Forearm Supination  5/5    Right Wrist Flexion  5/5    Right  Wrist Extension  5/5    Right Wrist Radial Deviation  5/5    Right Wrist Ulnar Deviation  5/5    Left Wrist Flexion  5/5    Left Wrist Extension  5/5    Left Wrist Radial Deviation  5/5    Left Wrist Ulnar Deviation  5/5    Right Hand Grip (lbs)  46   kg   Left Hand Grip (lbs)  42   kg                  [facility] Adult PT Treatment/Exercise - 11/09/19 0001      Exercises   Exercises  Wrist;Elbow      Elbow Exercises   Elbow Flexion Limitations  8# 2x10 each performed in supination, neutral, and pronation    Bar Weights/Barbell (Forearm Supination)  Other (comment)    hammer (2 lb ankle weight)   Forearm Supination Limitations  2x20    Bar Weights/Barbell (Forearm Pronation)  Other (comment)    hammer (2 lb ankle weight)   Forearm  Pronation Limitations  2x20    Other elbow exercises  Row with blue 2x20, extension with green 2x20, double ER with green 2x20, tricep push-down with green 2x20    Other elbow exercises  UBE x 4 min level 3 (2 fwd/bwd)      Wrist Exercises   Bar Weights/Barbell (Wrist Flexion)  5 lbs    Wrist Flexion Limitations  2x20    Bar Weights/Barbell (Wrist Extension)  5 lbs    Wrist Extension Limitations  2x20    Bar Weights/Barbell (Radial Deviation)  Other (comment)    hammer (1 lb ankle weight)   Wrist Radial Deviation Limitations  2x20    Bar Weights/Barbell (Ulnar Deviation)  Other (comment)    hammer (1 lb ankle weight)   Wrist Ulnar Deviation Limitations  2x20    Other wrist exercises  Standing shoulder flexion, abduction with 4 lbs 2x15 each             PT Education - 11/09/19 1415    Education Details  HEP    Person(s) Educated  Patient    Methods  Explanation;Demonstration;Verbal cues    Comprehension  Verbalized understanding;Returned demonstration;Verbal cues required;Need further instruction       PT Short Term Goals - 11/09/19 1432      PT SHORT TERM GOAL #1   Title  Patient will be I with initial HEP to progress in  PT.    Baseline  Given at eval    Time  3    Period  Weeks    Status  Achieved    Target Date  10/12/19      PT SHORT TERM GOAL #2   Title  Patient will report </= 2-3/10 pain level with typing or playing guitar.    Baseline  depends on time he plays, has improved    Time  3    Period  Weeks    Status  Achieved    Target Date  10/12/19        PT Long Term Goals - 11/09/19 1420      PT LONG TERM GOAL #1   Title  Patient will exhibit improved strength of 5/5 MMT throughout wrist and elbow to allow for improved activity tolerance.    Baseline  5/5 MMT    Time  6    Period  Weeks    Status  Achieved      PT LONG TERM GOAL #2   Title  Patient will exhibit left grip strength >/= 45 kg to indicate improved lifting ability with left hand    Baseline  42    Time  6    Period  Weeks    Status  Partially Met    Target Date  11/29/19      PT LONG TERM GOAL #3   Title  Patient will report </= 0-1/10 pain level with activity to indicated no limitation    Baseline  Patient occasional 2-3/10 pain when typing or playing guitar extended periods    Time  6    Period  Weeks    Status  Partially Met    Target Date  11/29/19      PT LONG TERM GOAL #4   Title  Patient will be consistent with HEP to maintain progress in PT    Baseline  Patient continues to require cueing with exercise    Time  6    Period  Weeks    Status  On-going  Target Date  11/29/19            Plan - 11/09/19 1417    Clinical Impression Statement  Patient is progressing well with therapy and is tolerating greater resistance and repetitions with exercises. He does continue to exhibit strength deficit and reports slight increased pain with extended periods of activities such as playing guitar and typing, but this has improved greatly since evaluation.  He would benefit from continued skilled PT to progress his strength and tolerance for extended periods of activity in order to reduce pain and limitation with  activities such as typing and playing guitar.    PT Frequency  2x / week    PT Duration  2 weeks    PT Treatment/Interventions  ADLs/Self Care Home Management;Cryotherapy;Electrical Stimulation;Moist Heat;Neuromuscular re-education;Therapeutic exercise;Therapeutic activities;Patient/family education;Manual techniques;Dry needling;Passive range of motion;Taping;Joint Manipulations    PT Next Visit Plan  Assess HEP and progress PRN, soft tissue and modalities to extensor bundle PRN, progress strengthening, assess and prescribe nerve glides PRN    PT Home Exercise Plan  [id number]; Wrist extension, flexion, pronation, supination, ulnar and radial deviation; bicep curl with forearm in supination, neutral, and pronation; wrist extension and flexion stretch with elbow extended, putty grip squeezes, banded row and double ER with green    Consulted and Agree with Plan of Care  Patient       Patient will benefit from skilled therapeutic intervention in order to improve the following deficits and impairments:  Decreased range of motion, Decreased strength, Postural dysfunction, Impaired flexibility, Pain, Decreased activity tolerance  Visit Diagnosis: Pain in left elbow  Pain in left wrist  Muscle weakness (generalized)     Problem List Patient Active Problem List   Diagnosis Date Noted  . Mixed hyperlipidemia 11/10/2018  . Schizophrenia ([place]) 11/10/2018  . 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 Encounter for therapeutic drug monitoring 05/06/2013    Hilda Blades, PT, DPT, LAT, ATC 11/09/19  2:37 PM Phone: [phone] Fax: [facility] [address] [place], [place], [address] Phone: [phone]   Fax:  [phone]  Name: Micheal Webb MRN: [id number] Date of Birth: July 28, 1976

## 2019-11-14 ENCOUNTER — Encounter: Payer: Self-pay | Admitting: Physical Therapy

## 2019-11-14 ENCOUNTER — Other Ambulatory Visit: Payer: Self-pay

## 2019-11-14 ENCOUNTER — Ambulatory Visit: Payer: Medicaid Other | Admitting: Physical Therapy

## 2019-11-14 DIAGNOSIS — M25532 Pain in left wrist: Secondary | ICD-10-CM

## 2019-11-14 DIAGNOSIS — M25522 Pain in left elbow: Secondary | ICD-10-CM

## 2019-11-14 DIAGNOSIS — M6281 Muscle weakness (generalized): Secondary | ICD-10-CM

## 2019-11-14 NOTE — Therapy (Addendum)
[place], [place], [address] Phone: [phone]   Fax:  [phone]  Physical Therapy Treatment  Patient Details  Name: Micheal Webb MRN: [id number] Date of Birth: 1975/10/20 Referring Provider (PT): Gentry Fitz, MD   Encounter Date: 11/14/2019  PT End of Session - 11/14/19 1405    Visit Number  12    Number of Visits  13    Date for PT Re-Evaluation  11/29/19    Authorization Type  MCD    PT Start Time  1400    PT Stop Time  1440   no charges this visit due to MCD auth still pending   PT Time Calculation (min)  40 min    Activity Tolerance  Patient tolerated treatment well    Behavior During Therapy  [facility] for tasks assessed/performed       Past Medical History:  Diagnosis Date  . Carpal tunnel syndrome on left   . Schizophrenia ([place])   . Seizure [facility])     Past Surgical History:  Procedure Laterality Date  . None      There were no vitals filed for this visit.  Subjective Assessment - 11/14/19 1404    Subjective  Patient reports he is doing well. No new issues.    Patient Stated Goals  Get rid of pain.    Currently in Pain?  No/denies                               PT Education - 11/14/19 1405    Education Details  HEP    Person(s) Educated  Patient    Methods  Explanation;Demonstration;Verbal cues    Comprehension  Verbalized understanding;Returned demonstration;Verbal cues required;Need further instruction       PT Short Term Goals - 11/09/19 1432      PT SHORT TERM GOAL #1   Title  Patient will be I with initial HEP to progress in PT.    Baseline  Given at eval    Time  3    Period  Weeks    Status  Achieved    Target Date  10/12/19      PT SHORT TERM GOAL #2   Title  Patient will report </= 2-3/10 pain level with typing or playing guitar.    Baseline  depends on time he plays, has improved    Time  3    Period  Weeks    Status   Achieved    Target Date  10/12/19        PT Long Term Goals - 11/09/19 1420      PT LONG TERM GOAL #1   Title  Patient will exhibit improved strength of 5/5 MMT throughout wrist and elbow to allow for improved activity tolerance.    Baseline  5/5 MMT    Time  6    Period  Weeks    Status  Achieved      PT LONG TERM GOAL #2   Title  Patient will exhibit left grip strength >/= 45 kg to indicate improved lifting ability with left hand    Baseline  42    Time  6    Period  Weeks    Status  Partially Met    Target Date  11/29/19      PT LONG TERM GOAL #3   Title  Patient will report </= 0-1/10 pain level  with activity to indicated no limitation    Baseline  Patient occasional 2-3/10 pain when typing or playing guitar extended periods    Time  6    Period  Weeks    Status  Partially Met    Target Date  11/29/19      PT LONG TERM GOAL #4   Title  Patient will be consistent with HEP to maintain progress in PT    Baseline  Patient continues to require cueing with exercise    Time  6    Period  Weeks    Status  On-going    Target Date  11/29/19            Plan - 11/14/19 1406    Clinical Impression Statement  Patient continues to report improvement and is tolerating exercises well with no increased pain level. He only has one more appointment and anticipate that being the last appointment needed as he is reporting reduced pain level and improved functional level. He would benefit from continued skilled PT to progress his strength and tolerance for extended periods of activity in order to reduce pain and limitation with activities such as typing and playing guitar.    PT Treatment/Interventions  ADLs/Self Care Home Management;Cryotherapy;Electrical Stimulation;Moist Heat;Neuromuscular re-education;Therapeutic exercise;Therapeutic activities;Patient/family education;Manual techniques;Dry needling;Passive range of motion;Taping;Joint Manipulations    PT Next Visit Plan  Assess HEP  and progress PRN, soft tissue and modalities to extensor bundle PRN, progress strengthening, assess and prescribe nerve glides PRN    PT Home Exercise Plan  [id number]; Wrist extension, flexion, pronation, supination, ulnar and radial deviation; bicep curl with forearm in supination, neutral, and pronation; wrist extension and flexion stretch with elbow extended, putty grip squeezes, banded row and double ER with green    Consulted and Agree with Plan of Care  Patient       Patient will benefit from skilled therapeutic intervention in order to improve the following deficits and impairments:  Decreased range of motion, Decreased strength, Postural dysfunction, Impaired flexibility, Pain, Decreased activity tolerance  Visit Diagnosis: Pain in left elbow  Pain in left wrist  Muscle weakness (generalized)     Problem List Patient Active Problem List   Diagnosis Date Noted  . Mixed hyperlipidemia 11/10/2018  . Schizophrenia ([place]) 11/10/2018  . 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 Encounter for therapeutic drug monitoring 05/06/2013    Hilda Blades, PT, DPT, LAT, ATC 11/15/19  8:21 AM Phone: [phone] Fax: [facility] [address] [place], [place], [address] Phone: [phone]   Fax:  [phone]  Name: TAMARICK KOVALCIK MRN: [id number] Date of Birth: 03-24-1976

## 2019-11-16 ENCOUNTER — Other Ambulatory Visit: Payer: Self-pay

## 2019-11-16 ENCOUNTER — Ambulatory Visit: Payer: Medicaid Other | Admitting: Physical Therapy

## 2019-11-16 ENCOUNTER — Encounter: Payer: Self-pay | Admitting: Physical Therapy

## 2019-11-16 DIAGNOSIS — M6281 Muscle weakness (generalized): Secondary | ICD-10-CM

## 2019-11-16 DIAGNOSIS — M25532 Pain in left wrist: Secondary | ICD-10-CM

## 2019-11-16 DIAGNOSIS — M25522 Pain in left elbow: Secondary | ICD-10-CM

## 2019-11-16 NOTE — Therapy (Signed)
[place], [place], [address] Phone: [phone]   Fax:  [phone]  Physical Therapy Treatment / Discharge  PHYSICAL THERAPY DISCHARGE SUMMARY  Visits from Start of Care: 13  Current functional level related to goals / functional outcomes: Patient has no functional deficits and has achieved all goals   Remaining deficits: None reported   Education / Equipment: HEP Plan: Patient agrees to discharge.  Patient goals were met. Patient is being discharged due to meeting the stated rehab goals.  ?????     Patient Details  Name: Micheal Webb MRN: [id number] Date of Birth: 1976/01/17 Referring Provider (PT): Gentry Fitz, MD   Encounter Date: 11/16/2019  PT End of Session - 11/16/19 1401    Visit Number  13    Number of Visits  13    Authorization Type  MCD    Authorization Time Period  11/15/2019-11/28/2019    Authorization - Visit Number  1    Authorization - Number of Visits  4    PT Start Time  9794    PT Stop Time  1433    PT Time Calculation (min)  38 min    Activity Tolerance  Patient tolerated treatment well    Behavior During Therapy  [facility] for tasks assessed/performed       Past Medical History:  Diagnosis Date  . Carpal tunnel syndrome on left   . Schizophrenia ([place])   . Seizure [facility])     Past Surgical History:  Procedure Laterality Date  . None      There were no vitals filed for this visit.  Subjective Assessment - 11/16/19 1357    Subjective  Patient reports he is doing well with no pain recently. He is ready to be discharged.    Patient Stated Goals  Get rid of pain.    Currently in Pain?  No/denies         [facility] PT Assessment - 11/16/19 0001      Assessment   Medical Diagnosis  Left lateral epicondylitis, forearm pain, carpal tunnel    Referring Provider (PT)  Gentry Fitz, MD      AROM   Overall AROM Comments  Full AROM or wrist and elbow, no  pain reported      Strength   Overall Strength Comments  Strength grossly 5/5 throughout bilat wrist and hands with no pain reported    Right Hand Grip (lbs)  46    Left Hand Grip (lbs)  45      Palpation   Palpation comment  No pain with palpation                   [facility] Adult PT Treatment/Exercise - 11/16/19 0001      Exercises   Exercises  Wrist;Elbow      Elbow Exercises   Elbow Flexion Limitations  8# x15 each performed in supination, neutral, and pronation    Forearm Supination  20 reps    Bar Weights/Barbell (Forearm Supination)  Other (comment)   hammer (2 lb ankle weight)   Forearm Pronation  20 reps    Bar Weights/Barbell (Forearm Pronation)  Other (comment)   hammer (2 lb ankle weight)   Other elbow exercises  Row with blue x20, extension with green x20, double ER with green x20, tricep push-down with blue x20    Other elbow exercises  UBE x 4 min level 3 (2 fwd/bwd)  Wrist Exercises   Wrist Flexion  20 reps    Bar Weights/Barbell (Wrist Flexion)  5 lbs    Wrist Extension  20 reps    Bar Weights/Barbell (Wrist Extension)  5 lbs    Wrist Radial Deviation  20 reps    Bar Weights/Barbell (Radial Deviation)  Other (comment)   hammer (1 lb ankle weight)   Wrist Ulnar Deviation  20 reps    Bar Weights/Barbell (Ulnar Deviation)  Other (comment)   hammer (1 lb ankle weight)   Other wrist exercises  Grip Digi-flex 5 lbs x20, push-up plantigrade on table x15    Other wrist exercises  Standing shoulder flexion and abduction with 4 lbs x15 each             PT Education - 11/16/19 1358    Education Details  HEP    Person(s) Educated  Patient    Methods  Explanation    Comprehension  Verbalized understanding;Returned demonstration       PT Short Term Goals - 11/16/19 1403      PT SHORT TERM GOAL #1   Title  Patient will be I with initial HEP to progress in PT.    Baseline  Given at eval    Time  3    Period  Weeks    Status  Achieved     Target Date  10/12/19      PT SHORT TERM GOAL #2   Title  Patient will report </= 2-3/10 pain level with typing or playing guitar.    Baseline  depends on time he plays, has improved    Time  3    Period  Weeks    Status  Achieved    Target Date  10/12/19        PT Long Term Goals - 11/16/19 1403      PT LONG TERM GOAL #1   Title  Patient will exhibit improved strength of 5/5 MMT throughout wrist and elbow to allow for improved activity tolerance.    Baseline  5/5 MMT    Time  6    Period  Weeks    Status  Achieved      PT LONG TERM GOAL #2   Title  Patient will exhibit left grip strength >/= 45 kg to indicate improved lifting ability with left hand    Baseline  45    Time  6    Period  Weeks    Status  Achieved      PT LONG TERM GOAL #3   Title  Patient will report </= 0-1/10 pain level with activity to indicated no limitation    Baseline  Patient reports no pain with acitivty    Time  6    Period  Weeks    Status  Achieved      PT LONG TERM GOAL #4   Title  Patient will be consistent with HEP to maintain progress in PT    Baseline  Patient independent with all HEP    Time  6    Period  Weeks    Status  Achieved            Plan - 11/16/19 1402    Clinical Impression Statement  Patient has achieved all established goals and reports no limitations or pain with activity. He is independent with his exercises and is ready to be discharged with HEP. Formal PT is no longer indicated at this time.    PT Home  Exercise Plan  [id number]; Wrist extension, flexion, pronation, supination, ulnar and radial deviation; bicep curl with forearm in supination, neutral, and pronation; wrist extension and flexion stretch with elbow extended, putty grip squeezes, banded row and double ER with green    Consulted and Agree with Plan of Care  Patient       Patient will benefit from skilled therapeutic intervention in order to improve the following deficits and impairments:     Visit  Diagnosis: Pain in left elbow  Pain in left wrist  Muscle weakness (generalized)     Problem List Patient Active Problem List   Diagnosis Date Noted  . Mixed hyperlipidemia 11/10/2018  . Schizophrenia ([place]) 11/10/2018  . 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 Encounter for therapeutic drug monitoring 05/06/2013    Hilda Blades, PT, DPT, LAT, ATC 11/16/19  2:33 PM Phone: [phone] Fax: [place] [facility] [address] [place], [place], [address] Phone: [phone]   Fax:  [phone]  Name: Micheal Webb MRN: [id number] Date of Birth: July 06, 1976

## 2019-12-06 ENCOUNTER — Other Ambulatory Visit: Payer: Self-pay

## 2019-12-06 ENCOUNTER — Ambulatory Visit: Payer: Medicaid Other | Admitting: Nurse Practitioner

## 2019-12-06 ENCOUNTER — Encounter: Payer: Self-pay | Admitting: Nurse Practitioner

## 2019-12-06 VITALS — BP 118/80 | HR 80 | Temp 98.1°F | Ht 69.0 in | Wt 154.4 lb

## 2019-12-06 DIAGNOSIS — G40309 Generalized idiopathic epilepsy and epileptic syndromes, not intractable, without status epilepticus: Secondary | ICD-10-CM | POA: Diagnosis not present

## 2019-12-06 DIAGNOSIS — E782 Mixed hyperlipidemia: Secondary | ICD-10-CM

## 2019-12-06 DIAGNOSIS — M79602 Pain in left arm: Secondary | ICD-10-CM

## 2019-12-06 DIAGNOSIS — F209 Schizophrenia, unspecified: Secondary | ICD-10-CM | POA: Diagnosis not present

## 2019-12-06 DIAGNOSIS — E559 Vitamin D deficiency, unspecified: Secondary | ICD-10-CM

## 2019-12-06 MED ORDER — MELOXICAM 15 MG PO TABS: 15.0000 mg | ORAL_TABLET | Freq: Every day | ORAL | 1 refills | Status: DC

## 2019-12-06 MED ORDER — MELOXICAM 15 MG PO TABS: 15.0000 mg | ORAL_TABLET | Freq: Every day | ORAL | 5 refills | Status: DC

## 2019-12-06 NOTE — Progress Notes (Signed)
Subjective:     Patient ID: Micheal Webb , male    DOB: 1976/08/30 , 44 y.o.   MRN: [id number]   Chief Complaint  Patient presents with  . Hyperlipidemia    HPI  Hyperlipidemia - doing well with his medications, denies muscle weakness.  Micheal Webb (psychiatry) - [facility]   Here today again with his mother.  Continues to see Micheal Webb has had a change clonazepam - 1 in am and 2 pm. Trying to do a pill pack. Micheal Webb placed him on Meloxicam for his left arm pain.  He had physical therapy. Has been on for 90 days.   Hyperlipidemia This is a chronic problem. The current episode started more than 1 year ago. The problem is controlled. Recent lipid tests were reviewed and are normal. He has no history of chronic renal disease. Pertinent negatives include no chest pain or shortness of breath. The current treatment provides significant improvement of lipids. There are no known risk factors for coronary artery disease.     Past Medical History:  Diagnosis Date  . Carpal tunnel syndrome on left   . Schizophrenia ([facility])   . Seizure [facility])      Family History  Problem Relation Age of Onset  . Breast cancer Mother   . Diverticulitis Mother        had surgery and colostomy bag 11/2015  . Colon cancer Paternal Grandfather   . Pancreatic cancer Maternal Grandmother      Current Outpatient Medications:  .  benztropine (COGENTIN) 1 MG tablet, Take 1 mg by mouth. 1 TAB IN THE MORNING 2 TABS AT BEDTIME, Disp: , Rfl:  .  clonazePAM (KLONOPIN) 1 MG tablet, Take 1 mg by mouth at bedtime., Disp: , Rfl:  .  divalproex (DEPAKOTE ER) 500 MG 24 hr tablet, 500 mg at bedtime. 05/07/18 taking three tabs at bedtime, Disp: , Rfl: 5 .  lamoTRIgine (LAMICTAL) 25 MG tablet, Take 2 tablets (50 mg total) by mouth 2 (two) times daily., Disp: 360 tablet, Rfl: 3 .  meloxicam (MOBIC) 15 MG tablet, Take 15 mg by mouth daily., Disp: , Rfl:  .  pravastatin (PRAVACHOL) 10 MG tablet, TAKE 1 TABLET BY MOUTH EVERY  DAY, Disp: 90 tablet, Rfl: 0 .  risperiDONE (RISPERDAL) 2 MG tablet, Take 2 mg by mouth 2 (two) times daily. , Disp: , Rfl:  .  albuterol (PROVENTIL HFA;VENTOLIN HFA) 108 (90 Base) MCG/ACT inhaler, Inhale 2 puffs into the lungs as needed for wheezing or shortness of breath. (Patient not taking: Reported on 12/06/2019), Disp: 1 Inhaler, Rfl: 6 .  budesonide-formoterol (SYMBICORT) 160-4.5 MCG/ACT inhaler, Inhale 2 puffs into the lungs daily. (Patient not taking: Reported on 12/06/2019), Disp: 1 Inhaler, Rfl: 6   No Known Allergies   Review of Systems  Constitutional: Negative for fatigue.  Respiratory: Negative for cough and shortness of breath.   Cardiovascular: Negative.  Negative for chest pain, palpitations and leg swelling.  Endocrine: Negative for polydipsia, polyphagia and polyuria.  Neurological: Negative for dizziness and headaches.     Today's Vitals   12/06/19 1457  BP: 118/80  Pulse: 80  Temp: 98.1 F (36.7 C)  TempSrc: Oral  Weight: 154 lb 6.4 oz (70 kg)  Height: 5\' 9"  (1.753 m)  PainSc: 0-No pain   Body mass index is 22.8 kg/m.   Objective:  Physical Exam Vitals reviewed.  Constitutional:      Appearance: Normal appearance.  Cardiovascular:     Rate and Rhythm:  Normal rate and regular rhythm.     Pulses: Normal pulses.     Heart sounds: Normal heart sounds. No murmur.  Pulmonary:     Effort: Pulmonary effort is normal.     Breath sounds: Normal breath sounds.  Neurological:     Mental Status: He is alert.  Psychiatric:        Mood and Affect: Mood normal.        Behavior: Behavior normal.        Thought Content: Thought content normal.        Judgment: Judgment normal.         Assessment And Plan:     1. Mixed hyperlipidemia  Chronic, controlled  Continue with current medications  Discussed increasing intake of fish and nuts for low HDL. - Lipid panel - CMP14 + Anion Gap  2. Generalized convulsive epilepsy ([place])  Chronic,  controlled  Continue with current medications  Continue follow up with Micheal Webb, will send copy of labs once results received - Lipid panel - CMP14 + Anion Gap  3. Schizophrenia, unspecified type ([place])  Chronic, controlled  Being followed at [facility] with current medications, recent changes to his clonazepam he has a follow up with behavioral health in May  He is more lethargic today during his visit  Will send copy of labs to Micheal Webb - Lipid panel - CMP14 + Anion Gap   4. Vitamin D deficiency  Normal levels  No vitamin d level check today  5. Left arm pain  Will continue with meloxicam Micheal Webb had started   Orthopedic is still unsure of why he is having the wrist pain.   Unable to tolerate nerve conduction study with Micheal Webb but was able to get enough information to know he does not have carpal tunnel - meloxicam (MOBIC) 15 MG tablet; Take 1 tablet (15 mg total) by mouth daily.  Dispense: 30 tablet; Refill: [place], Micheal Webb

## 2019-12-07 LAB — CMP14+EGFR
ALT: 12 IU/L (ref 0–44)
AST: 19 IU/L (ref 0–40)
Albumin/Globulin Ratio: 1.9 (ref 1.2–2.2)
Albumin: 4.5 g/dL (ref 4.0–5.0)
Alkaline Phosphatase: 82 IU/L (ref 39–117)
BUN/Creatinine Ratio: 5 — ABNORMAL LOW (ref 9–20)
BUN: 4 mg/dL — ABNORMAL LOW (ref 6–24)
Bilirubin Total: 0.5 mg/dL (ref 0.0–1.2)
CO2: 22 mmol/L (ref 20–29)
Calcium: 9.6 mg/dL (ref 8.7–10.2)
Chloride: 97 mmol/L (ref 96–106)
Creatinine, Ser: 0.8 mg/dL (ref 0.76–1.27)
GFR calc Af Amer: 124 mL/min/{1.73_m2} (ref >59)
GFR calc non Af Amer: 107 mL/min/{1.73_m2} (ref >59)
Globulin, Total: 2.4 g/dL (ref 1.5–4.5)
Glucose: 64 mg/dL — ABNORMAL LOW (ref 65–99)
Potassium: 3.9 mmol/L (ref 3.5–5.2)
Sodium: 136 mmol/L (ref 134–144)
Total Protein: 6.9 g/dL (ref 6.0–8.5)

## 2019-12-07 LAB — CBC
Hematocrit: 42 % (ref 37.5–51.0)
Hemoglobin: 15.2 g/dL (ref 13.0–17.7)
MCH: 33.4 pg — ABNORMAL HIGH (ref 26.6–33.0)
MCHC: 36.2 g/dL — ABNORMAL HIGH (ref 31.5–35.7)
MCV: 92 fL (ref 79–97)
Platelets: 189 10*3/uL (ref 150–450)
RBC: 4.55 x10E6/uL (ref 4.14–5.80)
RDW: 12.9 % (ref 11.6–15.4)
WBC: 8.8 10*3/uL (ref 3.4–10.8)

## 2019-12-07 LAB — LIPID PANEL
Chol/HDL Ratio: 3.5 ratio (ref 0.0–5.0)
Cholesterol, Total: 138 mg/dL (ref 100–199)
HDL: 40 mg/dL (ref >39)
LDL Chol Calc (NIH): 85 mg/dL (ref 0–99)
Triglycerides: 60 mg/dL (ref 0–149)
VLDL Cholesterol Cal: 13 mg/dL (ref 5–40)

## 2019-12-12 ENCOUNTER — Telehealth: Payer: Self-pay | Admitting: *Deleted

## 2019-12-12 NOTE — Telephone Encounter (Signed)
I called and LMVM for mother , Elita Quick, that pts lab results are stable.  She is to call back if questions.

## 2019-12-12 NOTE — Telephone Encounter (Signed)
-----   Message from Shawnie Dapper, NP sent at 12/07/2019  4:47 PM EDT ----- Labs look stable.

## 2019-12-19 ENCOUNTER — Inpatient Hospital Stay (HOSPITAL_COMMUNITY)
Admission: EM | Admit: 2019-12-19 | Discharge: 2019-12-20 | DRG: 887 | Discharge status: Against Medical Advice | Payer: Medicaid Other | Attending: Internal Medicine | Admitting: Internal Medicine

## 2019-12-19 ENCOUNTER — Ambulatory Visit: Payer: Medicaid Other | Admitting: Family Medicine

## 2019-12-19 ENCOUNTER — Encounter: Payer: Self-pay | Admitting: Family Medicine

## 2019-12-19 ENCOUNTER — Other Ambulatory Visit: Payer: Self-pay

## 2019-12-19 ENCOUNTER — Emergency Department (HOSPITAL_COMMUNITY): Payer: Medicaid Other

## 2019-12-19 ENCOUNTER — Encounter (HOSPITAL_COMMUNITY): Payer: Self-pay | Admitting: *Deleted

## 2019-12-19 VITALS — BP 119/66 | HR 60 | Temp 97.6°F | Ht 69.0 in

## 2019-12-19 DIAGNOSIS — F209 Schizophrenia, unspecified: Secondary | ICD-10-CM | POA: Diagnosis not present

## 2019-12-19 DIAGNOSIS — F202 Catatonic schizophrenia: Secondary | ICD-10-CM | POA: Diagnosis present

## 2019-12-19 DIAGNOSIS — G9340 Encephalopathy, unspecified: Secondary | ICD-10-CM | POA: Diagnosis present

## 2019-12-19 DIAGNOSIS — R4781 Slurred speech: Secondary | ICD-10-CM

## 2019-12-19 DIAGNOSIS — R2689 Other abnormalities of gait and mobility: Secondary | ICD-10-CM | POA: Diagnosis present

## 2019-12-19 DIAGNOSIS — G40409 Other generalized epilepsy and epileptic syndromes, not intractable, without status epilepticus: Secondary | ICD-10-CM | POA: Diagnosis present

## 2019-12-19 DIAGNOSIS — R5383 Other fatigue: Secondary | ICD-10-CM | POA: Diagnosis present

## 2019-12-19 DIAGNOSIS — Z800 Family history of malignant neoplasm of digestive organs: Secondary | ICD-10-CM

## 2019-12-19 DIAGNOSIS — Z79899 Other long term (current) drug therapy: Secondary | ICD-10-CM

## 2019-12-19 DIAGNOSIS — G40309 Generalized idiopathic epilepsy and epileptic syndromes, not intractable, without status epilepticus: Secondary | ICD-10-CM | POA: Diagnosis present

## 2019-12-19 DIAGNOSIS — M542 Cervicalgia: Secondary | ICD-10-CM | POA: Diagnosis not present

## 2019-12-19 DIAGNOSIS — E782 Mixed hyperlipidemia: Secondary | ICD-10-CM | POA: Diagnosis present

## 2019-12-19 DIAGNOSIS — Z79890 Hormone replacement therapy (postmenopausal): Secondary | ICD-10-CM | POA: Diagnosis not present

## 2019-12-19 DIAGNOSIS — G4710 Hypersomnia, unspecified: Principal | ICD-10-CM | POA: Diagnosis present

## 2019-12-19 DIAGNOSIS — R4182 Altered mental status, unspecified: Secondary | ICD-10-CM | POA: Diagnosis not present

## 2019-12-19 DIAGNOSIS — Z20822 Contact with and (suspected) exposure to covid-19: Secondary | ICD-10-CM | POA: Diagnosis present

## 2019-12-19 DIAGNOSIS — F17210 Nicotine dependence, cigarettes, uncomplicated: Secondary | ICD-10-CM | POA: Diagnosis present

## 2019-12-19 DIAGNOSIS — Z803 Family history of malignant neoplasm of breast: Secondary | ICD-10-CM

## 2019-12-19 DIAGNOSIS — Z5329 Procedure and treatment not carried out because of patient's decision for other reasons: Secondary | ICD-10-CM | POA: Diagnosis not present

## 2019-12-19 DIAGNOSIS — G35 Multiple sclerosis: Secondary | ICD-10-CM | POA: Diagnosis not present

## 2019-12-19 HISTORY — DX: Altered mental status, unspecified: R41.82

## 2019-12-19 LAB — MAGNESIUM: Magnesium: 2.1 mg/dL (ref 1.7–2.4)

## 2019-12-19 LAB — COMPREHENSIVE METABOLIC PANEL
ALT: 29 U/L (ref 0–44)
AST: 38 U/L (ref 15–41)
Albumin: 3.8 g/dL (ref 3.5–5.0)
Alkaline Phosphatase: 59 U/L (ref 38–126)
Anion gap: 10 (ref 5–15)
BUN: 11 mg/dL (ref 6–20)
CO2: 30 mmol/L (ref 22–32)
Calcium: 9.4 mg/dL (ref 8.9–10.3)
Chloride: 101 mmol/L (ref 98–111)
Creatinine, Ser: 0.89 mg/dL (ref 0.61–1.24)
GFR calc Af Amer: >60 mL/min (ref >60)
GFR calc non Af Amer: >60 mL/min (ref >60)
Glucose, Bld: 105 mg/dL — ABNORMAL HIGH (ref 70–99)
Potassium: 3.9 mmol/L (ref 3.5–5.1)
Sodium: 141 mmol/L (ref 135–145)
Total Bilirubin: 0.8 mg/dL (ref 0.3–1.2)
Total Protein: 6.9 g/dL (ref 6.5–8.1)

## 2019-12-19 LAB — CBC
HCT: 44.4 % (ref 39.0–52.0)
Hemoglobin: 15.2 g/dL (ref 13.0–17.0)
MCH: 32.3 pg (ref 26.0–34.0)
MCHC: 34.2 g/dL (ref 30.0–36.0)
MCV: 94.3 fL (ref 80.0–100.0)
Platelets: 172 10*3/uL (ref 150–400)
RBC: 4.71 MIL/uL (ref 4.22–5.81)
RDW: 12.8 % (ref 11.5–15.5)
WBC: 7.8 10*3/uL (ref 4.0–10.5)
nRBC: 0 % (ref 0.0–0.2)

## 2019-12-19 LAB — VALPROIC ACID LEVEL: Valproic Acid Lvl: 92 ug/mL (ref 50.0–100.0)

## 2019-12-19 MED ORDER — SODIUM CHLORIDE 0.9% FLUSH
3.0000 mL | Freq: Once | INTRAVENOUS | Status: AC
  Administered 2019-12-19: 3 mL via INTRAVENOUS

## 2019-12-19 NOTE — Consult Note (Signed)
NEURO HOSPITALIST CONSULT NOTE   Requestig physician: Dr. Denton Lank  Reason for Consult: Lethargy, gait imbalance and slurred speech  History obtained from:  Patient, Mother and Chart     HPI:                                                                                                                                          Micheal Webb is an 44 y.o. male with schizophrenia (on risperidone and benztropine) and known seizure disorder (on valproic acid, clonazepam and lamotrigine), who presents to the ED today after being seen in the Neurology clinic for lethargy, gait imbalance and slurred speech that had not improved since clonazepam dose was decreased by 50% about 3 weeks ago, after he was seen in clinic for such. CT head obtained in the ED this evening shows no acute abnormality. He is being admitted to the Hospitalist service with Neurology consultation to guide further testing.   Of note, he has not had any breakthrough seizures recently.   Today's outpatient Neurology clinic follow up note by Shawnie Dapper, NP has been reviewed: "Micheal Webb is a 44 y.o. male here today for follow up for seizure. He continues divalproex ER 1500mg  at bedtime and lamotrigine 50mg  twice daily. He continues to see , psychiatry, for schizophrenia. Clonazepam was increased to 1mg  in am and 2mg  in pm. He also contnues lamotrigine, risperidone and benztropine. PCP visit in March. He was lethargic and had slurred speech. Mom noticed that he was not able to walk straight. Mom called psychiatry and clonazepam was reduced to 1mg  in am and 1mg  at bedtime about 3 weeks ago. Mom reports that symptoms have not changed much. She has not seen any seizure like activity. He continues to have slurred speech and imbalance. She reports that he took all of his daily medications in the morning this past Friday. She is uncertain how he has taken medications over the weekend. He has held his head in  a downward position for the past 4 days. He reports pain but no clear reason why. He also reports being cold but no other infectious signs or symptoms. He reports having a neighbor that is disrupting his sleep and another neighbor that is mean to him. He is angry with his mother today, stating that she has another son she loves more and who is more successful. Labs reviewed from PCP were stable in 05/2019 and 11/2019."  Per mother, the above clinic note from today needs to be corrected/clarified regarding the timeline and Klonopin dose changes. Per mother, his Klonopin was at 1 mg BID until March 11, when it was increased to 1 mg qam/2mg  qhs for insomnia. He then became lethargic with increased somnolence and the gait instability manifested. 20 days later,  on March 31, he was seen again and the Klonopin was brought back down to its original dosage of 1 mg BID. However, in the 12 days since then, the patient's lethargy/somnolence/gait instability have not improved.   Of note, he was diagnosed with schizophrenia at about the age of 17.   ED course:  CT head: No acute intracranial pathology. Periventricular and deep white matter hypodensity disease, generally advanced for patient age, which may be related to hypertensive small-vessel white matter disease or alternately demyelinating disorder. Correlate with clinical history. MRI may be used to further assess if desired.  CT cervical spine: No fracture or static subluxation of the cervical spine.  Past Medical History:  Diagnosis Date  . Carpal tunnel syndrome on left   . Schizophrenia (HCC)   . Seizure [facility])     Past Surgical History:  Procedure Laterality Date  . None      Family History  Problem Relation Age of Onset  . Breast cancer Mother   . Diverticulitis Mother        had surgery and colostomy bag 11/2015  . Colon cancer Paternal Grandfather   . Pancreatic cancer Maternal Grandmother               Social History:  reports  that he has been smoking cigarettes. He has been smoking about 1.00 pack per day. He has never used smokeless tobacco. He reports previous alcohol use. He reports that he does not use drugs.  No Known Allergies  HOME MEDICATIONS:                                                                                                                      No current facility-administered medications on file prior to encounter.   Current Outpatient Medications on File Prior to Encounter  Medication Sig Dispense Refill  . albuterol (PROVENTIL HFA;VENTOLIN HFA) 108 (90 Base) MCG/ACT inhaler Inhale 2 puffs into the lungs as needed for wheezing or shortness of breath. 1 Inhaler 6  . benztropine (COGENTIN) 1 MG tablet Take 1 mg by mouth See admin instructions. 1 tablet in the morning and 2 tablets at bedtime.    . budesonide-formoterol (SYMBICORT) 160-4.5 MCG/ACT inhaler Inhale 2 puffs into the lungs daily. 1 Inhaler 6  . Cholecalciferol (VITAMIN D) 125 MCG (5000 UT) CAPS Take 1 tablet by mouth daily.    . clonazePAM (KLONOPIN) 1 MG tablet Take 1 mg by mouth in the morning and at bedtime.     . divalproex (DEPAKOTE ER) 500 MG 24 hr tablet 1,500 mg at bedtime.   5  . lamoTRIgine (LAMICTAL) 25 MG tablet Take 2 tablets (50 mg total) by mouth 2 (two) times daily. 360 tablet 3  . meloxicam (MOBIC) 15 MG tablet Take 1 tablet (15 mg total) by mouth daily. 30 tablet 5  . pravastatin (PRAVACHOL) 10 MG tablet TAKE 1 TABLET BY MOUTH EVERY DAY (Patient taking differently: Take 10 mg by mouth daily. ) 90 tablet 0  .  risperiDONE (RISPERDAL) 2 MG tablet Take 2 mg by mouth 2 (two) times daily.        ROS:                                                                                                                                       As per HPI. ROS from patient is limited by mild catatonic state with poverty of speech. Mother does not endorse any additional symptoms.    Blood pressure 102/65, pulse 62, temperature  98.4 F (36.9 C), temperature source Oral, resp. rate 20, height 5\' 9"  (1.753 m), weight 54.4 kg, SpO2 96 %.   General Examination:                                                                                                       Physical Exam  HEENT-  [place]/AT. Hair is somewhat disheveled.  Lungs- Respirations unlabored Extremities- No edema  Neurological Examination Mental Status: Initially asleep, the patient awakens to voice to a drowsy state, then to an awake state with decreased level of alertness. Able to answer questions and follow commands. Speech output is sparse but fluent. Naming intact. Has frequent inappropriate laughter; however, affect is subdued.  Cranial Nerves: II: Visual fields intact with no extinction to DSS. PERRL.  III,IV, VI: Mild exotropia. No nystagmus. No ptosis.  V,VII: Smile symmetric, facial light touch sensation equal bilaterally VIII: hearing intact to voice IX,X: No hypophonia.  XI: Head is midline but tends to be held in a slumped-forward position, without neck flexion or extension weakness noted.  XII: midline tongue extension Motor: Right : Upper extremity   5/5    Left:     Upper extremity   5/5  Lower extremity   5/5     Lower extremity   5/5 No cogwheel rigidity, tremor, dyskinesia or dystonia noted.  Sensory: Temp and light touch intact throughout, bilaterally Deep Tendon Reflexes: 1+ and symmetric throughout Cerebellar: No ataxia with FNF bilaterally Gait: Deferred Other: When seated, tends to slump forward with upper back and has significant slumping of neck/head, but able to straighten to command, briefly. He rapidly reassumes slumped posture and states that this is new for him, as well as difficult to control.    Lab Results: Basic Metabolic Panel: Recent Labs  Lab 12/19/19 1404 12/19/19 1842  NA 141  --   K 3.9  --   CL 101  --   CO2 30  --   GLUCOSE  105*  --   BUN 11  --   CREATININE 0.89  --   CALCIUM 9.4  --   MG  --  2.1     CBC: Recent Labs  Lab 12/19/19 1404  WBC 7.8  HGB 15.2  HCT 44.4  MCV 94.3  PLT 172    Cardiac Enzymes: No results for input(s): CKTOTAL, CKMB, CKMBINDEX, TROPONINI in the last 168 hours.  Lipid Panel: No results for input(s): CHOL, TRIG, HDL, CHOLHDL, VLDL, LDLCALC in the last 168 hours.  Imaging: CT Head Wo Contrast  Result Date: 12/19/2019 CLINICAL DATA:  Altered mental status EXAM: CT HEAD WITHOUT CONTRAST CT CERVICAL SPINE WITHOUT CONTRAST TECHNIQUE: Multidetector CT imaging of the head and cervical spine was performed following the standard protocol without intravenous contrast. Multiplanar CT image reconstructions of the cervical spine were also generated. COMPARISON:  None. FINDINGS: CT HEAD FINDINGS Brain: No evidence of acute infarction, hemorrhage, hydrocephalus, extra-axial collection or mass lesion/mass effect. Mild periventricular and deep white matter hypodensity. Vascular: No hyperdense vessel or unexpected calcification. Skull: Normal. Negative for fracture or focal lesion. Sinuses/Orbits: No acute finding. Other: None. CT CERVICAL SPINE FINDINGS Alignment: Normal. Skull base and vertebrae: No acute fracture. No primary bone lesion or focal pathologic process. Soft tissues and spinal canal: No prevertebral fluid or swelling. No visible canal hematoma. Disc levels: Mild multilevel disc space height loss and osteophytosis. Upper chest: Negative. Other: None. IMPRESSION: 1. No acute intracranial pathology. Periventricular and deep white matter hypodensity disease, generally advanced for patient age, which may be related to hypertensive small-vessel white matter disease or alternately demyelinating disorder. Correlate with clinical history. MRI may be used to further assess if desired. 2.  No fracture or static subluxation of the cervical spine. Electronically Signed   By: Lauralyn Primes M.D.   On: 12/19/2019 18:43   CT CERVICAL SPINE WO CONTRAST  Result Date:  12/19/2019 CLINICAL DATA:  Altered mental status EXAM: CT HEAD WITHOUT CONTRAST CT CERVICAL SPINE WITHOUT CONTRAST TECHNIQUE: Multidetector CT imaging of the head and cervical spine was performed following the standard protocol without intravenous contrast. Multiplanar CT image reconstructions of the cervical spine were also generated. COMPARISON:  None. FINDINGS: CT HEAD FINDINGS Brain: No evidence of acute infarction, hemorrhage, hydrocephalus, extra-axial collection or mass lesion/mass effect. Mild periventricular and deep white matter hypodensity. Vascular: No hyperdense vessel or unexpected calcification. Skull: Normal. Negative for fracture or focal lesion. Sinuses/Orbits: No acute finding. Other: None. CT CERVICAL SPINE FINDINGS Alignment: Normal. Skull base and vertebrae: No acute fracture. No primary bone lesion or focal pathologic process. Soft tissues and spinal canal: No prevertebral fluid or swelling. No visible canal hematoma. Disc levels: Mild multilevel disc space height loss and osteophytosis. Upper chest: Negative. Other: None. IMPRESSION: 1. No acute intracranial pathology. Periventricular and deep white matter hypodensity disease, generally advanced for patient age, which may be related to hypertensive small-vessel white matter disease or alternately demyelinating disorder. Correlate with clinical history. MRI may be used to further assess if desired. 2.  No fracture or static subluxation of the cervical spine. Electronically Signed   By: Lauralyn Primes M.D.   On: 12/19/2019 18:43    Assessment: 44 year old male with known seizure disorder and schizophrenia, presenting to the ED from clinic today with chief complaints of lethargy, gait imbalance and slurred speech, ongoing for > 3 weeks 1. CT head reveals hypodensities, most likely representing chronic small vessel ischemia, but also compatible with possible demyelinating disease.  2. Overall  clinical picture is most consistent with mild  catatonia and hypersomnolence. Suspect medication-related but will need to obtain MRI brain with and without contrast as well as EEG.  3. No findings on exam to suggest a myelopathy.   Recommendations: 1. MRI brain with and without contrast (ordered).  2. EEG (ordered).  3. If the above are negative, would lower Klonopin to 0.5 mg BID.  4. Would obtain input from psychiatry regarding empirically lowering of his risperdal dose to assess for possible improvement of his somnolence and mild catatonia, and also whether it is possible to obtain a serum level of this medication.     Electronically signed: Dr. Kerney Elbe 12/19/2019, 9:24 PM

## 2019-12-19 NOTE — Progress Notes (Signed)
PATIENT: Micheal Webb DOB: 26-Jan-1976  REASON FOR VISIT: follow up HISTORY FROM: patient  Chief Complaint  Patient presents with  . Follow-up    Rm 2, mother. Klonopin dose change bacame real lethargice.  No Pt on klonopin 1mg  po bid, Been to pcp, but then last week has had slurred speech and offbalance.  Does respond, Neck donw.      HISTORY OF PRESENT ILLNESS: Today 12/19/19 Micheal Webb is a 44 y.o. male here today for follow up for seizure. He continues divalproex ER 1500mg  at bedtime and lamotrigine 50mg  twice daily. He continues to see 47, psychiatry, for schizophrenia. Clonazepam was increased to 1mg  in am and 2mg  in pm. He also contnues lamotrigine, risperidone and benztropine. PCP visit in March. He was lethargic and had slurred speech. Mom noticed that he was not able to walk straight. Mom called psychiatry and clonazepam was reduced to 1mg  in am and 1mg  at bedtime about 3 weeks ago. Mom reports that symptoms have not changed much. She has not seen any seizure like activity. He continues to have slurred speech and imbalance. She reports that he took all of his daily medications in the morning this past Friday. She is uncertain how he has taken medications over the weekend. He has held his head in a downward position for the past 4 days. He reports pain but no clear reason why. He also reports being cold but no other infectious signs or symptoms. He reports having a neighbor that is disrupting his sleep and another neighbor that is mean to him. He is angry with his mother today, stating that she has another son she loves more and who is more successful.    Labs reviewed from PCP were stable in 05/2019 and 11/2019.  HISTORY: (copied from my note on 05/30/2019)  Micheal Webb is a 44 y.o. male here today for follow up for seizure disorder. He has not had any seizure activity. He continues divalproex ER 1500mg  at bedtime. He is taking lamotrigine 50mg  twice  daily. He continues to follow psychiatry closely for schizophrenia. He is taking clonazepam 1mg  at bedtime and risperidone 2mg  twice daily. All above medications are currently prescribed by psychiatry. He is followed regularly by PCP as well. He requests to get labs drawn at PCP office.   HISTORY: (copied from La Luz Martin's note on8/30/2019)  Micheal Webb, 44yr old white male returns for yearly followup with history of seizures. 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 The patient is on Depakote in part for psychiatric reasons, but this medication alone did not fully control his seizures. This patient has been on a low dose of Lamictal, taking 50 mg twice daily since 2015 and no further seizure events since that time.The patient has tolerated the medication well.Labs through PCP, these are due next week.Micheal Webb His schizophrenia is treated through psychiatry. He returns for reevaluation   REVIEW OF SYSTEMS: Out of a complete 14 system review of symptoms, the patient complains only of the following symptoms, slurred speech, imbalance, lethergy and all other reviewed systems are negative.  ALLERGIES: No Known Allergies  HOME MEDICATIONS: Outpatient Medications Prior to Visit  Medication Sig Dispense Refill  . albuterol (PROVENTIL HFA;VENTOLIN HFA) 108 (90 Base) MCG/ACT inhaler Inhale 2 puffs into the lungs as needed for wheezing or shortness of breath. 1 Inhaler 6  . benztropine (COGENTIN) 1 MG tablet Take 1 mg by mouth. 1 TAB IN THE MORNING  2 TABS AT BEDTIME    . budesonide-formoterol (SYMBICORT) 160-4.5 MCG/ACT inhaler Inhale 2 puffs into the lungs daily. 1 Inhaler 6  . clonazePAM (KLONOPIN) 1 MG tablet Take 1 mg by mouth at bedtime.    . divalproex (DEPAKOTE ER) 500 MG 24 hr tablet 500 mg at bedtime. 05/07/18 taking three tabs at bedtime  5  . lamoTRIgine (LAMICTAL) 25 MG tablet Take 2 tablets (50 mg total) by mouth 2 (two) times daily. 360 tablet 3  .  meloxicam (MOBIC) 15 MG tablet Take 1 tablet (15 mg total) by mouth daily. 30 tablet 5  . pravastatin (PRAVACHOL) 10 MG tablet TAKE 1 TABLET BY MOUTH EVERY DAY 90 tablet 0  . risperiDONE (RISPERDAL) 2 MG tablet Take 2 mg by mouth 2 (two) times daily.      No facility-administered medications prior to visit.    PAST MEDICAL HISTORY: Past Medical History:  Diagnosis Date  . Carpal tunnel syndrome on left   . Schizophrenia (HCC)   . Seizure (HCC)     PAST SURGICAL HISTORY: Past Surgical History:  Procedure Laterality Date  . None      FAMILY HISTORY: Family History  Problem Relation Age of Onset  . Breast cancer Mother   . Diverticulitis Mother        had surgery and colostomy bag 11/2015  . Colon cancer Paternal Grandfather   . Pancreatic cancer Maternal Grandmother     SOCIAL HISTORY: Social History   Socioeconomic History  . Marital status: Single    Spouse name: Not on file  . Number of children: Not on file  . Years of education: Not on file  . Highest education level: Not on file  Occupational History  . Not on file  Tobacco Use  . Smoking status: Current Every Day Smoker    Packs/day: 1.00    Types: Cigarettes  . Smokeless tobacco: Never Used  Substance and Sexual Activity  . Alcohol use: Yes    Comment: occasion  . Drug use: No    Comment: Denies  . Sexual activity: Not on file  Other Topics Concern  . Not on file  Social History Narrative   Patient lives alone.    Patient is on disability.   Patient is single.       Social Determinants of Health   Financial Resource Strain:   . Difficulty of Paying Living Expenses:   Food Insecurity:   . Worried About Programme researcher, broadcasting/film/video in the Last Year:   . Barista in the Last Year:   Transportation Needs:   . Freight forwarder (Medical):   [name] Lack of Transportation (Non-Medical):   Physical Activity:   . Days of Exercise per Week:   . Minutes of Exercise per Session:   Stress:   . Feeling  of Stress :   Social Connections:   . Frequency of Communication with Friends and Family:   . Frequency of Social Gatherings with Friends and Family:   . Attends Religious Services:   . Active Member of Clubs or Organizations:   . Attends Banker Meetings:   [name] Marital Status:   Intimate Partner Violence:   . Fear of Current or Ex-Partner:   . Emotionally Abused:   [name] Physically Abused:   . Sexually Abused:       PHYSICAL EXAM  Vitals:   12/19/19 1037  BP: 119/66  Pulse: 60  Temp: 97.6 F (36.4 C)  SpO2: 99%  Height: 5\' 9"  (1.753 m)   Body mass index is 22.8 kg/m.  Generalized: Well developed, in no acute distress, patient is intermittently talkative and seems more angry than with previous visits.  Cardiology: normal rate and rhythm, no murmur noted Respiratory: clear to auscultation bilaterally Neurological examination  Mentation:  Easily aroused with conversation but seems angry at times, other times he holds his head down and does not talk. He is oriented to time, place, history taking. Follows all commands, speech is slurred Cranial nerve II-XII: Pupils were equal round reactive to light. Extraocular movements were full, visual field were full on confrontational test. Facial sensation and strength were normal. Uvula tongue midline. Head turning and shoulder shrug  were normal and symmetric. Motor: The motor testing reveals 5 over 5 strength of all 4 extremities. Good symmetric motor tone is noted throughout.  Sensory: Sensory testing is intact to soft touch on all 4 extremities. No evidence of extinction is noted.  Coordination: Cerebellar testing reveals good finger-nose-finger and heel-to-shin bilaterally.  Gait and station: Gait is mildly unsteady, he is able to readjust balance with no assistance.  Reflexes: Deep tendon reflexes are symmetric and normal bilaterally.   DIAGNOSTIC DATA (LABS, IMAGING, TESTING) - I reviewed patient records, labs, notes,  testing and imaging myself where available.  No flowsheet data found.   Lab Results  Component Value Date   WBC 8.8 12/06/2019   HGB 15.2 12/06/2019   HCT 42.0 12/06/2019   MCV 92 12/06/2019   PLT 189 12/06/2019      Component Value Date/Time   NA 136 12/06/2019 1549   K 3.9 12/06/2019 1549   CL 97 12/06/2019 1549   CO2 22 12/06/2019 1549   GLUCOSE 64 (L) 12/06/2019 1549   GLUCOSE 97 07/03/2014 1111   BUN 4 (L) 12/06/2019 1549   CREATININE 0.80 12/06/2019 1549   CALCIUM 9.6 12/06/2019 1549   PROT 6.9 12/06/2019 1549   ALBUMIN 4.5 12/06/2019 1549   AST 19 12/06/2019 1549   ALT 12 12/06/2019 1549   ALKPHOS 82 12/06/2019 1549   BILITOT 0.5 12/06/2019 1549   GFRNONAA 107 12/06/2019 1549   GFRAA 124 12/06/2019 1549   Lab Results  Component Value Date   CHOL 138 12/06/2019   HDL 40 12/06/2019   LDLCALC 85 12/06/2019   TRIG 60 12/06/2019   CHOLHDL 3.5 12/06/2019   No results found for: HGBA1C No results found for: VITAMINB12 No results found for: TSH     ASSESSMENT AND PLAN 44 y.o. year old male  has a past medical history of Carpal tunnel syndrome on left, Schizophrenia (HCC), and Seizure (HCC). here with     ICD-10-CM   1. 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 Schizophrenia, unspecified type (HCC)  F20.9   3. Neck pain  M54.2   4. Slurred speech  R47.81   5. Imbalance  R26.89     Micheal Webb has been experiencing some mood changes over the past 3 months.  Medications have been adjusted by psychiatry and most recently he has been noted to be more lethargic, having slurred speech and balance changes.  Primary care evaluated symptoms and basic blood work was unremarkable.  Symptoms have continued and his mother is more concerned as he is now holding his head in a downward position.  He does report pain in the office, however exam is normal.  He is able to flex and extend neck without difficulty.  Vital signs are stable.  Neurologic  exam is unremarkable.   There was a questionable fall over the weekend where he leaned over a heater.  He does endorse being cold today.  I am suspicious of an appropriate administration of medications and feel this is the most likely cause of his symptoms today.  Behavior is inconsistent with previous seizure activity and I do not suspect this is cause of symptoms. I have discussed these concerns with his mother.  She feels that his actions today are completely out of character for him.  I have recommended a further evaluation by the emergency room to rule out any other causes of behavioral changes.  We have discussed need for head imaging, assessment of drug levels and electrolytes.  She feels comfortable transporting him to [facility] emergency room for further assessment.  Mother was instructed to call us with updates.  We will follow-up pending assessment.  Etai and his mother both understand and agree with this plan.   No orders of the defined types were placed in this encounter.    No orders of the defined types were placed in this encounter.     I spent 45 minutes with the patient. 50% of this time was spent counseling and educating patient on plan of care and medications.    Debbora Presto, FNP-C 12/19/2019, 1:11 PM [facility] [address] [place], [place] [address] [phone]

## 2019-12-19 NOTE — ED Provider Notes (Signed)
[facility] EMERGENCY DEPARTMENT Provider Note   CSN: [id number] Arrival date & time: 12/19/19  1317     History Chief Complaint  Patient presents with  . Altered Mental Status    Micheal Webb is a 44 y.o. male.  The history is provided by the patient, a parent and medical records. The history is limited by the condition of the patient.     44 year old male with past medical history of schizophrenia and a seizure disorder presenting to the emergency department accompanied by his mother following a recent change in patient's anxiety medication with associated increased somnolence.  History predominantly obtained from the patient's mother as the patient was uncooperative during my examination.  The patient's mother described that the patient has been undergoing recent medication changes in terms of his clonazepam regimen for his schizophrenia treatment.  1 week ago the patient's regimen was changed from 1 tab in the morning and 1 at night to 1 in the morning and 2 at night as the patient has been having difficulty sleeping.  Since that change the patient has been more somnolent.  Additionally the patient's mother describes the patient holding his head in a flexed position.  Patient was referred to neurology and evaluated in their office today.  Neurology is note describes the patient being easily arousable and conversant though seemingly angry at times and at other times holding his head down refusing to talk.  During their visit the patient was oriented to time, place and follows all commands though his speech was noted to be slurred.  Patient's mother also notes the patient has had slurred speech and imbalance.  Patient's mother has not noted any new seizure-like activity.  Patient's mother helps organize the patient's medications in a pillbox.  But the patient administers his own medications and lives alone.  Patient noted in the outpatient visit with neurology today that  he has a neighbor who is been disrupting his sleep which was the etiology for the change in clonazepam medication.  He was also noted to be angry with his mother today stating that his mother has another son which she lives more and is more successful.  Past Medical History:  Diagnosis Date  . Carpal tunnel syndrome on left   . Schizophrenia (HCC)   . Seizure [facility])     Patient Active Problem List   Diagnosis Date Noted  . Mixed hyperlipidemia 11/10/2018  . Schizophrenia (HCC) 11/10/2018  . 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 Encounter for therapeutic drug monitoring 05/06/2013    Past Surgical History:  Procedure Laterality Date  . None         Family History  Problem Relation Age of Onset  . Breast cancer Mother   . Diverticulitis Mother        had surgery and colostomy bag 11/2015  . Colon cancer Paternal Grandfather   . Pancreatic cancer Maternal Grandmother     Social History   Tobacco Use  . Smoking status: Current Every Day Smoker    Packs/day: 1.00    Types: Cigarettes  . Smokeless tobacco: Never Used  Substance Use Topics  . Alcohol use: Not Currently    Comment: occasion  . Drug use: No    Comment: Denies    Home Medications Prior to Admission medications   Medication Sig Start Date End Date Taking? Authorizing Provider  albuterol (PROVENTIL HFA;VENTOLIN HFA) 108 (90 Base) MCG/ACT inhaler Inhale 2 puffs into the lungs as needed for wheezing  or shortness of breath. 11/24/18  Yes [name], FNP  benztropine (COGENTIN) 1 MG tablet Take 1 mg by mouth See admin instructions. 1 tablet in the morning and 2 tablets at bedtime.   Yes [provider]  budesonide-formoterol (SYMBICORT) 160-4.5 MCG/ACT inhaler Inhale 2 puffs into the lungs daily. 11/24/18  Yes [name], FNP  Cholecalciferol (VITAMIN D) 125 MCG (5000 UT) CAPS Take 1 tablet by mouth daily.   Yes [provider]  clonazePAM (KLONOPIN) 1 MG tablet Take 1 mg by  mouth in the morning and at bedtime.    Yes [provider]  divalproex (DEPAKOTE ER) 500 MG 24 hr tablet 1,500 mg at bedtime.  04/28/18  Yes [provider]  lamoTRIgine (LAMICTAL) 25 MG tablet Take 2 tablets (50 mg total) by mouth 2 (two) times daily. 07/28/19  Yes Lomax, Amy, NP  meloxicam (MOBIC) 15 MG tablet Take 1 tablet (15 mg total) by mouth daily. 12/06/19  Yes [name], FNP  pravastatin (PRAVACHOL) 10 MG tablet TAKE 1 TABLET BY MOUTH EVERY DAY Patient taking differently: Take 10 mg by mouth daily.  09/19/19  Yes [name], FNP  risperiDONE (RISPERDAL) 2 MG tablet Take 2 mg by mouth 2 (two) times daily.    Yes [provider]    Allergies    Patient has no known allergies.  Review of Systems   Review of Systems  Unable to perform ROS: Other (Patient refusal to participate)    Physical Exam Updated Vital Signs BP 102/65   Pulse 62   Temp 98.4 F (36.9 C) (Oral)   Resp 20   Ht 5\' 9"  (1.753 m)   Wt 54.4 kg   SpO2 96%   BMI 17.72 kg/m   Physical Exam Vitals and nursing note reviewed.  Constitutional:      General: He is sleeping. He is not in acute distress.    Appearance: Normal appearance. He is normal weight. He is not ill-appearing.     Comments: Disheveled appearing  HENT:     Head: Normocephalic.     Right Ear: External ear normal.     Left Ear: External ear normal.     Nose: Nose normal.     Mouth/Throat:     Mouth: Mucous membranes are moist.     Pharynx: Oropharynx is clear.  Eyes:     Extraocular Movements: Extraocular movements intact.  Cardiovascular:     Rate and Rhythm: Normal rate and regular rhythm.     Pulses: Normal pulses.     Heart sounds: Normal heart sounds.  Pulmonary:     Effort: Pulmonary effort is normal. No respiratory distress.     Breath sounds: Normal breath sounds. No wheezing or rhonchi.  Abdominal:     General: Bowel sounds are normal.     Palpations: Abdomen is soft.     Tenderness: There  is no abdominal tenderness. There is no guarding.  Musculoskeletal:     Cervical back: Normal range of motion and neck supple. No rigidity or tenderness.     Right lower leg: No edema.     Left lower leg: No edema.  Skin:    General: Skin is warm and dry.     Capillary Refill: Capillary refill takes less than 2 seconds.  Neurological:     General: No focal deficit present.     GCS: GCS eye subscore is 1. GCS verbal subscore is 1. GCS motor subscore is 5.     Comments:  Patient uncooperative with exam, arouses to noxious stimulus  Psychiatric:        Mood and Affect: Mood normal.        Speech: He is noncommunicative.        Behavior: Behavior is uncooperative and withdrawn.     ED Results / Procedures / Treatments   Labs (all labs ordered are listed, but only abnormal results are displayed) Labs Reviewed  COMPREHENSIVE METABOLIC PANEL - Abnormal; Notable for the following components:      Result Value   Glucose, Bld 105 (*)    All other components within normal limits  SARS CORONAVIRUS 2 (TAT 6-24 HRS)  CBC  MAGNESIUM  VALPROIC ACID LEVEL  RAPID URINE DRUG SCREEN, HOSP PERFORMED  URINALYSIS, ROUTINE W REFLEX MICROSCOPIC  LAMOTRIGINE LEVEL  CBG MONITORING, ED    EKG EKG Interpretation  Date/Time:  Monday December 19 2019 18:35:11 EDT Ventricular Rate:  60 PR Interval:    QRS Duration: 100 QT Interval:  418 QTC Calculation: 418 R Axis:   85 Text Interpretation: Sinus rhythm Borderline short PR interval No previous tracing Confirmed by Cathren Laine ([id number]) on 12/19/2019 6:40:07 PM   Radiology CT Head Wo Contrast  Result Date: 12/19/2019 CLINICAL DATA:  Altered mental status EXAM: CT HEAD WITHOUT CONTRAST CT CERVICAL SPINE WITHOUT CONTRAST TECHNIQUE: Multidetector CT imaging of the head and cervical spine was performed following the standard protocol without intravenous contrast. Multiplanar CT image reconstructions of the cervical spine were also generated. COMPARISON:   None. FINDINGS: CT HEAD FINDINGS Brain: No evidence of acute infarction, hemorrhage, hydrocephalus, extra-axial collection or mass lesion/mass effect. Mild periventricular and deep white matter hypodensity. Vascular: No hyperdense vessel or unexpected calcification. Skull: Normal. Negative for fracture or focal lesion. Sinuses/Orbits: No acute finding. Other: None. CT CERVICAL SPINE FINDINGS Alignment: Normal. Skull base and vertebrae: No acute fracture. No primary bone lesion or focal pathologic process. Soft tissues and spinal canal: No prevertebral fluid or swelling. No visible canal hematoma. Disc levels: Mild multilevel disc space height loss and osteophytosis. Upper chest: Negative. Other: None. IMPRESSION: 1. No acute intracranial pathology. Periventricular and deep white matter hypodensity disease, generally advanced for patient age, which may be related to hypertensive small-vessel white matter disease or alternately demyelinating disorder. Correlate with clinical history. MRI may be used to further assess if desired. 2.  No fracture or static subluxation of the cervical spine. Electronically Signed   By: Lauralyn Primes M.D.   On: 12/19/2019 18:43   CT CERVICAL SPINE WO CONTRAST  Result Date: 12/19/2019 CLINICAL DATA:  Altered mental status EXAM: CT HEAD WITHOUT CONTRAST CT CERVICAL SPINE WITHOUT CONTRAST TECHNIQUE: Multidetector CT imaging of the head and cervical spine was performed following the standard protocol without intravenous contrast. Multiplanar CT image reconstructions of the cervical spine were also generated. COMPARISON:  None. FINDINGS: CT HEAD FINDINGS Brain: No evidence of acute infarction, hemorrhage, hydrocephalus, extra-axial collection or mass lesion/mass effect. Mild periventricular and deep white matter hypodensity. Vascular: No hyperdense vessel or unexpected calcification. Skull: Normal. Negative for fracture or focal lesion. Sinuses/Orbits: No acute finding. Other: None. CT  CERVICAL SPINE FINDINGS Alignment: Normal. Skull base and vertebrae: No acute fracture. No primary bone lesion or focal pathologic process. Soft tissues and spinal canal: No prevertebral fluid or swelling. No visible canal hematoma. Disc levels: Mild multilevel disc space height loss and osteophytosis. Upper chest: Negative. Other: None. IMPRESSION: 1. No acute intracranial pathology. Periventricular and deep white matter hypodensity disease, generally advanced for patient  age, which may be related to hypertensive small-vessel white matter disease or alternately demyelinating disorder. Correlate with clinical history. MRI may be used to further assess if desired. 2.  No fracture or static subluxation of the cervical spine. Electronically Signed   By: Eddie Candle M.D.   On: 12/19/2019 18:43    Procedures Procedures (including critical care time)  Medications Ordered in ED Medications  sodium chloride flush (NS) 0.9 % injection 3 mL (3 mLs Intravenous Given 12/19/19 1901)    ED Course  I have reviewed the triage vital signs and the nursing notes.  Pertinent labs & imaging results that were available during my care of the patient were reviewed by me and considered in my medical decision making (see chart for details).    MDM Rules/Calculators/A&P                      44 year old male with past medical history of schizophrenia and a seizure disorder presenting to the emergency department accompanied by his mother following a recent change in patient's anxiety medication with associated increased somnolence.  Differential diagnoses considered include medication side effect, accidental or intentional medication overdose, volitional lack of cooperation and behavioral problem, low suspicion for meningitis or acute intracranial abnormality, possible patient could be postictal though no seizure-like activity described, nutritional deficiency, electrolyte derangement, anemia, abnormal antiepileptic drug  levels  ECG interpreted by me demonstrated Sinus rhythm at 60 bpm, normal axis, normal intervals, no ST or T wave change suggestive of acute ischemia, normal EKG, no previous for comparison  CT head demonstrated no acute intracranial pathology, periventricular and deep white matter hypodensity disease General advanced for the patient's age possibly related to small vessel white matter disease or possibly a demyelinating disorder, no acute fractures or malalignments of the cervical spine  Labs demonstrated no significant arrangements on CMP, unremarkable CBC, magnesium 2.1, valproic acid level within normal limits  Discussed case with neurology who will follow along in consultation  Given the above findings, my suspicion is that the patient does have multifactorial metabolic encephalopathy complicated by possible psychiatric disorder versus polypharmacy.  Patient has significant dysfunctionality and ability to maintain his ADLs.  The patient warrants further evaluation inpatient setting.  We will admit to hospitalist for ongoing evaluation, monitoring and management  The plan for this patient was discussed with Dr. Ashok Cordia, who voiced agreement and who oversaw evaluation and treatment of this patient.  Final Clinical Impression(s) / ED Diagnoses Final diagnoses:  Altered mental status, unspecified altered mental status type    Rx / DC Orders ED Discharge Orders    None       Filbert Berthold, MD 12/19/19 2137    Lajean Saver, MD 12/19/19 2210

## 2019-12-19 NOTE — H&P (Signed)
History and Physical   Micheal Webb PRF:[id number] DOB: 1976-08-09 DOA: 12/19/2019  Referring MD/NP/PA: Dr. Denton Lank  PCP: [name], FNP   Outpatient Specialists: [facility]  Patient coming from: Home  Chief Complaint: Lethargy and altered mental status, with gait imbalance  HPI: Micheal Webb is a 44 y.o. male with medical history significant of schizophrenia, seizure disorder, carpal tunnel syndrome, who was seen in the neurology clinic today with increasing lethargy gait imbalance and slurred speech.  He is on multiple medication including risperidone benztropine valproic acid clonazepam lamotrigine.  Symptoms apparently started about 3 weeks ago.  At that time clonazepam dose was increased.  He came in with the symptoms that were evaluated and sent over to the ED for continued evaluation.  Here in the ED patient was seen and evaluated he is fully awake at the moment.  Mother is bedside and giving more history.  She confirmed that his demeanor has changed.  He lives alone in his house.  He is also followed by psychiatry.  Patient at this point is suspected to have his either neurologic or psychiatric event.  CT head in the ER is negative.  He is otherwise withdrawn but communicating.  Neurology consulted and is being admitted to the hospital for evaluation..  ED Course: Temperature 98 for blood pressure 91/42 pulse 76 respirate of 20 oxygen sats 94% on room air.  CBC and chemistry all within normal.  Head CT without contrast also negative.  Magnesium 2.1.  COVID-19 so far negative.  CT cervical spine is head CT without contrast all within negative.  Patient is seen by neurology and awaiting MRI of the brain.  He be admitted for further work-up.  Review of Systems: As per HPI otherwise 10 point review of systems negative.    Past Medical History:  Diagnosis Date  . Carpal tunnel syndrome on left   . Schizophrenia (HCC)   . Seizure [facility])     Past Surgical History:  Procedure  Laterality Date  . None       reports that he has been smoking cigarettes. He has been smoking about 1.00 pack per day. He has never used smokeless tobacco. He reports previous alcohol use. He reports that he does not use drugs.  No Known Allergies  Family History  Problem Relation Age of Onset  . Breast cancer Mother   . Diverticulitis Mother        had surgery and colostomy bag 11/2015  . Colon cancer Paternal Grandfather   . Pancreatic cancer Maternal Grandmother      Prior to Admission medications   Medication Sig Start Date End Date Taking? Authorizing Provider  albuterol (PROVENTIL HFA;VENTOLIN HFA) 108 (90 Base) MCG/ACT inhaler Inhale 2 puffs into the lungs as needed for wheezing or shortness of breath. 11/24/18  Yes [name], FNP  benztropine (COGENTIN) 1 MG tablet Take 1 mg by mouth See admin instructions. 1 tablet in the morning and 2 tablets at bedtime.   Yes [provider]  budesonide-formoterol (SYMBICORT) 160-4.5 MCG/ACT inhaler Inhale 2 puffs into the lungs daily. 11/24/18  Yes [name], FNP  Cholecalciferol (VITAMIN D) 125 MCG (5000 UT) CAPS Take 1 tablet by mouth daily.   Yes [provider]  clonazePAM (KLONOPIN) 1 MG tablet Take 1 mg by mouth in the morning and at bedtime.    Yes [provider]  divalproex (DEPAKOTE ER) 500 MG 24 hr tablet 1,500 mg at bedtime.  04/28/18  Yes [provider]  lamoTRIgine (LAMICTAL) 25 MG tablet Take 2 tablets (50 mg total) by mouth 2 (two) times daily. 07/28/19  Yes Lomax, Amy, NP  meloxicam (MOBIC) 15 MG tablet Take 1 tablet (15 mg total) by mouth daily. 12/06/19  Yes [name], FNP  pravastatin (PRAVACHOL) 10 MG tablet TAKE 1 TABLET BY MOUTH EVERY DAY Patient taking differently: Take 10 mg by mouth daily.  09/19/19  Yes [name], FNP  risperiDONE (RISPERDAL) 2 MG tablet Take 2 mg by mouth 2 (two) times daily.    Yes [provider]    Physical Exam: Vitals:   12/19/19  1915 12/19/19 2015 12/19/19 2030 12/19/19 2200  BP: (!) 111/56 106/73 120/63 114/62  Pulse: (!) 59 60 61 (!) 58  Resp: 20 12 11 12   Temp:      TempSrc:      SpO2: 94% 96% 94% 94%  Weight:      Height:          Constitutional: Calm, withdrawn, avoiding eye contact Vitals:   12/19/19 1915 12/19/19 2015 12/19/19 2030 12/19/19 2200  BP: (!) 111/56 106/73 120/63 114/62  Pulse: (!) 59 60 61 (!) 58  Resp: 20 12 11 12   Temp:      TempSrc:      SpO2: 94% 96% 94% 94%  Weight:      Height:       Eyes: PERRL, lids and conjunctivae normal ENMT: Mucous membranes are moist. Posterior pharynx clear of any exudate or lesions.Normal dentition.  Neck: normal, supple, no masses, no thyromegaly Respiratory: clear to auscultation bilaterally, no wheezing, no crackles. Normal respiratory effort. No accessory muscle use.  Cardiovascular: Regular rate and rhythm, no murmurs / rubs / gallops. No extremity edema. 2+ pedal pulses. No carotid bruits.  Abdomen: no tenderness, no masses palpated. No hepatosplenomegaly. Bowel sounds positive.  Musculoskeletal: no clubbing / cyanosis. No joint deformity upper and lower extremities. Good ROM, no contractures. Normal muscle tone.  Skin: no rashes, lesions, ulcers. No induration Neurologic: CN 2-12 grossly intact. Sensation intact, DTR normal. Strength 5/5 in all 4.  Psychiatric: Patient appears depressed, withdrawn, not showing any eye contact.  He is not agitated.  Not suicidal or homicidal.    Labs on Admission: I have personally reviewed following labs and imaging studies  CBC: Recent Labs  Lab 12/19/19 1404  WBC 7.8  HGB 15.2  HCT 44.4  MCV 94.3  PLT 172   Basic Metabolic Panel: Recent Labs  Lab 12/19/19 1404 12/19/19 1842  NA 141  --   K 3.9  --   CL 101  --   CO2 30  --   GLUCOSE 105*  --   BUN 11  --   CREATININE 0.89  --   CALCIUM 9.4  --   MG  --  2.1   GFR: Estimated Creatinine Clearance: 79.8 mL/min (by C-G formula based on  SCr of 0.89 mg/dL). Liver Function Tests: Recent Labs  Lab 12/19/19 1404  AST 38  ALT 29  ALKPHOS 59  BILITOT 0.8  PROT 6.9  ALBUMIN 3.8   No results for input(s): LIPASE, AMYLASE in the last 168 hours. No results for input(s): AMMONIA in the last 168 hours. Coagulation Profile: No results for input(s): INR, PROTIME in the last 168 hours. Cardiac Enzymes: No results for input(s): CKTOTAL, CKMB, CKMBINDEX, TROPONINI in the last 168 hours. BNP (last 3 results) No results for input(s): PROBNP in the last 8760 hours. HbA1C: No results for input(s): HGBA1C in  the last 72 hours. CBG: No results for input(s): GLUCAP in the last 168 hours. Lipid Profile: No results for input(s): CHOL, HDL, LDLCALC, TRIG, CHOLHDL, LDLDIRECT in the last 72 hours. Thyroid Function Tests: No results for input(s): TSH, T4TOTAL, FREET4, T3FREE, THYROIDAB in the last 72 hours. Anemia Panel: No results for input(s): VITAMINB12, FOLATE, FERRITIN, TIBC, IRON, RETICCTPCT in the last 72 hours. Urine analysis:    Component Value Date/Time   COLORURINE YELLOW 10/24/2007 0018   APPEARANCEUR CLEAR 10/24/2007 0018   LABSPEC 1.036 (H) 10/24/2007 0018   PHURINE 5.5 10/24/2007 0018   GLUCOSEU NEGATIVE 10/24/2007 0018   HGBUR NEGATIVE 10/24/2007 0018   BILIRUBINUR SMALL (A) 10/24/2007 0018   KETONESUR 15 (A) 10/24/2007 0018   PROTEINUR NEGATIVE 10/24/2007 0018   UROBILINOGEN 0.2 10/24/2007 0018   NITRITE NEGATIVE 10/24/2007 0018   LEUKOCYTESUR  10/24/2007 0018    NEGATIVE MICROSCOPIC NOT DONE ON URINES WITH NEGATIVE PROTEIN, BLOOD, LEUKOCYTES, NITRITE, OR GLUCOSE <1000 mg/dL.   Sepsis Labs: @LABRCNTIP (procalcitonin:4,lacticidven:4) )No results found for this or any previous visit (from the past 240 hour(s)).   Radiological Exams on Admission: CT Head Wo Contrast  Result Date: 12/19/2019 CLINICAL DATA:  Altered mental status EXAM: CT HEAD WITHOUT CONTRAST CT CERVICAL SPINE WITHOUT CONTRAST TECHNIQUE:  Multidetector CT imaging of the head and cervical spine was performed following the standard protocol without intravenous contrast. Multiplanar CT image reconstructions of the cervical spine were also generated. COMPARISON:  None. FINDINGS: CT HEAD FINDINGS Brain: No evidence of acute infarction, hemorrhage, hydrocephalus, extra-axial collection or mass lesion/mass effect. Mild periventricular and deep white matter hypodensity. Vascular: No hyperdense vessel or unexpected calcification. Skull: Normal. Negative for fracture or focal lesion. Sinuses/Orbits: No acute finding. Other: None. CT CERVICAL SPINE FINDINGS Alignment: Normal. Skull base and vertebrae: No acute fracture. No primary bone lesion or focal pathologic process. Soft tissues and spinal canal: No prevertebral fluid or swelling. No visible canal hematoma. Disc levels: Mild multilevel disc space height loss and osteophytosis. Upper chest: Negative. Other: None. IMPRESSION: 1. No acute intracranial pathology. Periventricular and deep white matter hypodensity disease, generally advanced for patient age, which may be related to hypertensive small-vessel white matter disease or alternately demyelinating disorder. Correlate with clinical history. MRI may be used to further assess if desired. 2.  No fracture or static subluxation of the cervical spine. Electronically Signed   By: Eddie Candle M.D.   On: 12/19/2019 18:43   CT CERVICAL SPINE WO CONTRAST  Result Date: 12/19/2019 CLINICAL DATA:  Altered mental status EXAM: CT HEAD WITHOUT CONTRAST CT CERVICAL SPINE WITHOUT CONTRAST TECHNIQUE: Multidetector CT imaging of the head and cervical spine was performed following the standard protocol without intravenous contrast. Multiplanar CT image reconstructions of the cervical spine were also generated. COMPARISON:  None. FINDINGS: CT HEAD FINDINGS Brain: No evidence of acute infarction, hemorrhage, hydrocephalus, extra-axial collection or mass lesion/mass effect.  Mild periventricular and deep white matter hypodensity. Vascular: No hyperdense vessel or unexpected calcification. Skull: Normal. Negative for fracture or focal lesion. Sinuses/Orbits: No acute finding. Other: None. CT CERVICAL SPINE FINDINGS Alignment: Normal. Skull base and vertebrae: No acute fracture. No primary bone lesion or focal pathologic process. Soft tissues and spinal canal: No prevertebral fluid or swelling. No visible canal hematoma. Disc levels: Mild multilevel disc space height loss and osteophytosis. Upper chest: Negative. Other: None. IMPRESSION: 1. No acute intracranial pathology. Periventricular and deep white matter hypodensity disease, generally advanced for patient age, which may be related to hypertensive small-vessel white  matter disease or alternately demyelinating disorder. Correlate with clinical history. MRI may be used to further assess if desired. 2.  No fracture or static subluxation of the cervical spine. Electronically Signed   By: Lauralyn Primes M.D.   On: 12/19/2019 18:43    EKG: Independently reviewed.  Shows normal sinus rhythm with a rate of 60.  Mild voltage criteria for LVH.  Assessment/Plan Principal Problem:   AMS (altered mental status) Active Problems:   Generalized convulsive epilepsy (HCC)   Mixed hyperlipidemia   Schizophrenia (HCC)     #1 altered mental status with lethargy and gait abnormalities: Patient being evaluated by neurology.  No evidence of seizures so this cannot be post ictal.  May be medications related.  We will defer to and follow neurology recommendations.  #2 seizure disorder: Awaiting MRI of the brain.  Resume home regimen and adjustment per neurology  #3 hyperlipidemia: Continue home regimen  #4 schizophrenia: Resume home regimen   DVT prophylaxis: Lovenox Code Status: Full code Family Communication: Mother at bedside Disposition Plan: To be determined Consults called: Dr. Otelia Limes, neurology Admission status:  Inpatient  Severity of Illness: The appropriate patient status for this patient is INPATIENT. Inpatient status is judged to be reasonable and necessary in order to provide the required intensity of service to ensure the patient's safety. The patient's presenting symptoms, physical exam findings, and initial radiographic and laboratory data in the context of their chronic comorbidities is felt to place them at high risk for further clinical deterioration. Furthermore, it is not anticipated that the patient will be medically stable for discharge from the hospital within 2 midnights of admission. The following factors support the patient status of inpatient.   " The patient's presenting symptoms include neurologic symptoms. " The worrisome physical exam findings include confused and withdrawn. " The initial radiographic and laboratory data are worrisome because of normal lab and CT findings. " The chronic co-morbidities include schizophrenia.   * I certify that at the point of admission it is my clinical judgment that the patient will require inpatient hospital care spanning beyond 2 midnights from the point of admission due to high intensity of service, high risk for further deterioration and high frequency of surveillance required.Lonia Blood MD [facility] Pager [phone]  If 7PM-7AM, please contact night-coverage [email or web address] Password [facility]  12/19/2019, 10:25 PM

## 2019-12-19 NOTE — ED Triage Notes (Signed)
Mother states pain had a change in dosage of medicine for anixety  Several weeks agoik, more lethargic last weeks c/o neck  Mother states he keeps his neck down all the time

## 2019-12-19 NOTE — ED Notes (Signed)
Pt transported to CT ?

## 2019-12-19 NOTE — Progress Notes (Signed)
I have read the note, and I agree with the clinical assessment and plan.  Zoua Caporaso K [place]olfo   

## 2019-12-20 ENCOUNTER — Inpatient Hospital Stay (HOSPITAL_COMMUNITY): Payer: Medicaid Other

## 2019-12-20 ENCOUNTER — Emergency Department (HOSPITAL_COMMUNITY)
Admission: EM | Admit: 2019-12-20 | Discharge: 2019-12-20 | Discharge status: Absent without leave | Payer: Medicaid Other

## 2019-12-20 DIAGNOSIS — G35 Multiple sclerosis: Secondary | ICD-10-CM

## 2019-12-20 DIAGNOSIS — G40309 Generalized idiopathic epilepsy and epileptic syndromes, not intractable, without status epilepticus: Secondary | ICD-10-CM

## 2019-12-20 LAB — COMPREHENSIVE METABOLIC PANEL
ALT: 23 U/L (ref 0–44)
AST: 29 U/L (ref 15–41)
Albumin: 3.2 g/dL — ABNORMAL LOW (ref 3.5–5.0)
Alkaline Phosphatase: 51 U/L (ref 38–126)
Anion gap: 9 (ref 5–15)
BUN: 14 mg/dL (ref 6–20)
CO2: 25 mmol/L (ref 22–32)
Calcium: 8.8 mg/dL — ABNORMAL LOW (ref 8.9–10.3)
Chloride: 105 mmol/L (ref 98–111)
Creatinine, Ser: 0.78 mg/dL (ref 0.61–1.24)
GFR calc Af Amer: >60 mL/min (ref >60)
GFR calc non Af Amer: >60 mL/min (ref >60)
Glucose, Bld: 80 mg/dL (ref 70–99)
Potassium: 3.7 mmol/L (ref 3.5–5.1)
Sodium: 139 mmol/L (ref 135–145)
Total Bilirubin: 1 mg/dL (ref 0.3–1.2)
Total Protein: 6 g/dL — ABNORMAL LOW (ref 6.5–8.1)

## 2019-12-20 LAB — URINALYSIS, ROUTINE W REFLEX MICROSCOPIC
Bacteria, UA: NONE SEEN
Bilirubin Urine: NEGATIVE
Glucose, UA: NEGATIVE mg/dL
Hgb urine dipstick: NEGATIVE
Ketones, ur: 20 mg/dL — AB
Leukocytes,Ua: NEGATIVE
Nitrite: NEGATIVE
Protein, ur: 30 mg/dL — AB
Specific Gravity, Urine: 1.031 — ABNORMAL HIGH (ref 1.005–1.030)
pH: 6 (ref 5.0–8.0)

## 2019-12-20 LAB — CBC
HCT: 44.6 % (ref 39.0–52.0)
Hemoglobin: 15.1 g/dL (ref 13.0–17.0)
MCH: 31.8 pg (ref 26.0–34.0)
MCHC: 33.9 g/dL (ref 30.0–36.0)
MCV: 93.9 fL (ref 80.0–100.0)
Platelets: 160 10*3/uL (ref 150–400)
RBC: 4.75 MIL/uL (ref 4.22–5.81)
RDW: 12.7 % (ref 11.5–15.5)
WBC: 9.5 10*3/uL (ref 4.0–10.5)
nRBC: 0 % (ref 0.0–0.2)

## 2019-12-20 LAB — RAPID URINE DRUG SCREEN, HOSP PERFORMED
Amphetamines: NOT DETECTED
Barbiturates: NOT DETECTED
Benzodiazepines: POSITIVE — AB
Cocaine: NOT DETECTED
Opiates: NOT DETECTED
Tetrahydrocannabinol: POSITIVE — AB

## 2019-12-20 LAB — HIV ANTIBODY (ROUTINE TESTING W REFLEX): HIV Screen 4th Generation wRfx: NONREACTIVE

## 2019-12-20 LAB — LAMOTRIGINE LEVEL: Lamotrigine Lvl: 4.7 ug/mL (ref 2.0–20.0)

## 2019-12-20 LAB — SARS CORONAVIRUS 2 (TAT 6-24 HRS): SARS Coronavirus 2: NEGATIVE

## 2019-12-20 MED ORDER — BENZTROPINE MESYLATE 1 MG PO TABS: 1.0000 mg | ORAL_TABLET | ORAL | Status: DC

## 2019-12-20 MED ORDER — ACETAMINOPHEN 650 MG RE SUPP: 650.0000 mg | Freq: Four times a day (QID) | RECTAL | Status: DC | PRN

## 2019-12-20 MED ORDER — SODIUM CHLORIDE 0.9 % IV SOLN
1000.0000 mg | INTRAVENOUS | Status: DC
  Administered 2019-12-20: 1000 mg via INTRAVENOUS
  Filled 2019-12-20: qty 8

## 2019-12-20 MED ORDER — RISPERIDONE 0.5 MG PO TABS
0.5000 mg | ORAL_TABLET | Freq: Two times a day (BID) | ORAL | Status: DC
  Filled 2019-12-20 (×2): qty 1

## 2019-12-20 MED ORDER — ONDANSETRON HCL 4 MG PO TABS: 4.0000 mg | ORAL_TABLET | Freq: Four times a day (QID) | ORAL | Status: DC | PRN

## 2019-12-20 MED ORDER — PRAVASTATIN SODIUM 10 MG PO TABS
10.0000 mg | ORAL_TABLET | Freq: Every day | ORAL | Status: DC
  Administered 2019-12-20: 10 mg via ORAL
  Filled 2019-12-20: qty 1

## 2019-12-20 MED ORDER — BENZTROPINE MESYLATE 1 MG PO TABS: 2.0000 mg | ORAL_TABLET | Freq: Every day | ORAL | Status: DC

## 2019-12-20 MED ORDER — MELOXICAM 7.5 MG PO TABS
15.0000 mg | ORAL_TABLET | Freq: Every day | ORAL | Status: DC
  Administered 2019-12-20: 15 mg via ORAL
  Filled 2019-12-20 (×2): qty 2

## 2019-12-20 MED ORDER — RISPERIDONE 2 MG PO TABS
2.0000 mg | ORAL_TABLET | Freq: Two times a day (BID) | ORAL | Status: DC
  Administered 2019-12-20: 2 mg via ORAL
  Filled 2019-12-20 (×3): qty 1

## 2019-12-20 MED ORDER — ENOXAPARIN SODIUM 40 MG/0.4ML ~~LOC~~ SOLN
40.0000 mg | Freq: Every day | SUBCUTANEOUS | Status: DC
  Administered 2019-12-20: 40 mg via SUBCUTANEOUS
  Filled 2019-12-20: qty 0.4

## 2019-12-20 MED ORDER — BENZTROPINE MESYLATE 1 MG PO TABS
1.0000 mg | ORAL_TABLET | Freq: Every morning | ORAL | Status: DC
  Administered 2019-12-20: 13:00:00 1 mg via ORAL
  Filled 2019-12-20: qty 1

## 2019-12-20 MED ORDER — ALBUTEROL SULFATE HFA 108 (90 BASE) MCG/ACT IN AERS
2.0000 | INHALATION_SPRAY | RESPIRATORY_TRACT | Status: DC | PRN
  Filled 2019-12-20: qty 6.7

## 2019-12-20 MED ORDER — ACETAMINOPHEN 325 MG PO TABS: 650.0000 mg | ORAL_TABLET | Freq: Four times a day (QID) | ORAL | Status: DC | PRN

## 2019-12-20 MED ORDER — GADOBUTROL 1 MMOL/ML IV SOLN
6.0000 mL | Freq: Once | INTRAVENOUS | Status: AC | PRN
  Administered 2019-12-20: 6 mL via INTRAVENOUS

## 2019-12-20 MED ORDER — LAMOTRIGINE 25 MG PO TABS
50.0000 mg | ORAL_TABLET | Freq: Two times a day (BID) | ORAL | Status: DC
  Administered 2019-12-20 (×2): 50 mg via ORAL
  Filled 2019-12-20 (×3): qty 2

## 2019-12-20 MED ORDER — CLONAZEPAM 1 MG PO TABS: 1.0000 mg | ORAL_TABLET | Freq: Two times a day (BID) | ORAL | Status: DC

## 2019-12-20 MED ORDER — FLUTICASONE FUROATE-VILANTEROL 200-25 MCG/INH IN AEPB
1.0000 | INHALATION_SPRAY | Freq: Every day | RESPIRATORY_TRACT | Status: DC
  Filled 2019-12-20: qty 28

## 2019-12-20 MED ORDER — VITAMIN D 25 MCG (1000 UNIT) PO TABS
5000.0000 [IU] | ORAL_TABLET | Freq: Every day | ORAL | Status: DC
  Administered 2019-12-20: 5000 [IU] via ORAL
  Filled 2019-12-20: qty 5

## 2019-12-20 MED ORDER — ONDANSETRON HCL 4 MG/2ML IJ SOLN: 4.0000 mg | Freq: Four times a day (QID) | INTRAMUSCULAR | Status: DC | PRN

## 2019-12-20 MED ORDER — SODIUM CHLORIDE 0.9 % IV SOLN: INTRAVENOUS | Status: DC

## 2019-12-20 MED ORDER — DIVALPROEX SODIUM ER 500 MG PO TB24
1500.0000 mg | ORAL_TABLET | Freq: Every day | ORAL | Status: DC
  Administered 2019-12-20: 03:00:00 1500 mg via ORAL
  Filled 2019-12-20 (×2): qty 3

## 2019-12-20 MED ORDER — LORAZEPAM 2 MG/ML IJ SOLN: 1.0000 mg | INTRAMUSCULAR | Status: DC | PRN

## 2019-12-20 MED ORDER — CLONAZEPAM 0.5 MG PO TABS
1.0000 mg | ORAL_TABLET | Freq: Three times a day (TID) | ORAL | Status: DC | PRN
  Administered 2019-12-20: 1 mg via ORAL
  Filled 2019-12-20: qty 2

## 2019-12-20 MED ORDER — RISPERIDONE 2 MG PO TABS
2.0000 mg | ORAL_TABLET | Freq: Every day | ORAL | Status: DC
  Filled 2019-12-20: qty 1

## 2019-12-20 NOTE — Consult Note (Signed)
Telepsych Consultation   Reason for Consult:  "Medication adjustment recommendation" Referring Physician:  Dr. Rhona Leavens Location of Patient: [facility] Location of Provider: [facility]  Patient Identification: Micheal Webb MRN:  [id number] Principal Diagnosis: AMS (altered mental status) Diagnosis:  Principal Problem:   AMS (altered mental status) Active Problems:   Generalized convulsive epilepsy (HCC)   Mixed hyperlipidemia   Schizophrenia (HCC)   Total Time spent with patient: 30 minutes  Subjective:   Micheal Webb is a 44 y.o. male patient.  Patient assessed by nurse practitioner.  Patient alert and oriented.  Patient participates minimally in assessment, patient appears paranoid, patient conversation tangential in nature. Patient denies suicidal and homicidal ideations.  Patient denies auditory and visual hallucinations. Patient with paranoid ideations at this time.  Patient states "why am I in here for, like jail, I have seen that on TV my mom is putting me in jail."  Patient becomes irritable and verbally aggressive during assessment. Patient gives verbal consent to speak with his mother, Micheal Webb phone number [phone]. Spoke with patient's mother, Micheal Webb: Patient's mother reports patient increasingly agitated and aggressive since January 2021.  Patient's mother reports during the last 2 months patient has become more lethargic with slurred speech and experiencing falls at home.  Patient's mother reports neurology consult on yesterday, Amy Lomax with [facility] neurology recommended emergency department assessment. Per patient's mother patient lives alone in [place].  Patient does not have access to weapons.  Patient uses marijuana rarely.  Patient not employed but receives disability benefits. Mother unable to speak to patient's medication compliance.   HPI:  Patient admitted with lethargy and altered mental status.   Past Psychiatric  History: Schizophrenia  Risk to Self: Denies Risk to Others:  Denies Prior Inpatient Therapy:  Unknown Prior Outpatient Therapy:  Yes  Past Medical History:  Past Medical History:  Diagnosis Date  . Carpal tunnel syndrome on left   . Schizophrenia (HCC)   . Seizure [facility]     Past Surgical History:  Procedure Laterality Date  . None     Family History:  Family History  Problem Relation Age of Onset  . Breast cancer Mother   . Diverticulitis Mother        had surgery and colostomy bag 11/2015  . Colon cancer Paternal Grandfather   . Pancreatic cancer Maternal Grandmother    Family Psychiatric  History: Unknown Social History:  Social History   Substance and Sexual Activity  Alcohol Use Not Currently   Comment: occasion     Social History   Substance and Sexual Activity  Drug Use No   Comment: Denies    Social History   Socioeconomic History  . Marital status: Single    Spouse name: Not on file  . Number of children: Not on file  . Years of education: Not on file  . Highest education level: Not on file  Occupational History  . Not on file  Tobacco Use  . Smoking status: Current Every Day Smoker    Packs/day: 1.00    Types: Cigarettes  . Smokeless tobacco: Never Used  Substance and Sexual Activity  . Alcohol use: Not Currently    Comment: occasion  . Drug use: No    Comment: Denies  . Sexual activity: Not on file  Other Topics Concern  . Not on file  Social History Narrative   Patient lives alone.    Patient is on disability.   Patient is single.  Social Determinants of Health   Financial Resource Strain:   . Difficulty of Paying Living Expenses:   Food Insecurity:   . Worried About Programme researcher, broadcasting/film/video in the Last Year:   . Barista in the Last Year:   Transportation Needs:   . Freight forwarder (Medical):   [name] Lack of Transportation (Non-Medical):   Physical Activity:   . Days of Exercise per Week:   . Minutes of Exercise  per Session:   Stress:   . Feeling of Stress :   Social Connections:   . Frequency of Communication with Friends and Family:   . Frequency of Social Gatherings with Friends and Family:   . Attends Religious Services:   . Active Member of Clubs or Organizations:   . Attends Banker Meetings:   [name] Marital Status:    Additional Social History:    Allergies:  No Known Allergies  Labs:  Results for orders placed or performed during the hospital encounter of 12/19/19 (from the past 48 hour(s))  Comprehensive metabolic panel     Status: Abnormal   Collection Time: 12/19/19  2:04 PM  Result Value Ref Range   Sodium 141 135 - 145 mmol/L   Potassium 3.9 3.5 - 5.1 mmol/L   Chloride 101 98 - 111 mmol/L   CO2 30 22 - 32 mmol/L   Glucose, Bld 105 (H) 70 - 99 mg/dL    Comment: Glucose reference range applies only to samples taken after fasting for at least 8 hours.   BUN 11 6 - 20 mg/dL   Creatinine, Ser 5.74 0.61 - 1.24 mg/dL   Calcium 9.4 8.9 - 73.4 mg/dL   Total Protein 6.9 6.5 - 8.1 g/dL   Albumin 3.8 3.5 - 5.0 g/dL   AST 38 15 - 41 U/L   ALT 29 0 - 44 U/L   Alkaline Phosphatase 59 38 - 126 U/L   Total Bilirubin 0.8 0.3 - 1.2 mg/dL   GFR calc non Af Amer >60 >60 mL/min   GFR calc Af Amer >60 >60 mL/min   Anion gap 10 5 - 15    Comment: Performed at [facility], [address] [place], [place] [address]  CBC     Status: None   Collection Time: 12/19/19  2:04 PM  Result Value Ref Range   WBC 7.8 4.0 - 10.5 K/uL   RBC 4.71 4.22 - 5.81 MIL/uL   Hemoglobin 15.2 13.0 - 17.0 g/dL   HCT 64.3 83.8 - 18.4 %   MCV 94.3 80.0 - 100.0 fL   MCH 32.3 26.0 - 34.0 pg   MCHC 34.2 30.0 - 36.0 g/dL   RDW 03.7 54.3 - 60.6 %   Platelets 172 150 - 400 K/uL   nRBC 0.0 0.0 - 0.2 %    Comment: Performed at [facility], [address] [place], [place] [address]  Magnesium     Status: None   Collection Time: 12/19/19  6:42 PM  Result Value Ref Range   Magnesium 2.1 1.7 -  2.4 mg/dL    Comment: Performed at [facility], [address], [place], [place] [address]  Valproic acid level     Status: None   Collection Time: 12/19/19  6:42 PM  Result Value Ref Range   Valproic Acid Lvl 92 50.0 - 100.0 ug/mL    Comment: Performed at [facility], [address], [place], [place] [address]  SARS CORONAVIRUS 2 (TAT 6-24 HRS) Nasopharyngeal Nasopharyngeal Swab     Status: None   Collection Time: 12/19/19 10:11 PM   Specimen: Nasopharyngeal Swab  Result Value Ref Range   SARS Coronavirus 2 NEGATIVE NEGATIVE    Comment: (NOTE) SARS-CoV-2 target nucleic acids are NOT DETECTED. The SARS-CoV-2 RNA is generally detectable in upper and lower respiratory specimens during the acute phase of infection. Negative results do not preclude SARS-CoV-2 infection, do not rule out co-infections with other pathogens, and should not be used as the sole basis for treatment or other patient management decisions. Negative results must be combined with clinical observations, patient history, and epidemiological information. The expected result is Negative. Fact Sheet for Patients: HairSlick.no Fact Sheet for Healthcare Providers: quierodirigir.com This test is not yet approved or cleared by the [place] FDA and  has been authorized for detection and/or diagnosis of SARS-CoV-2 by FDA under an Emergency Use Authorization (EUA). This EUA will remain  in effect (meaning this test can be used) for the duration of the COVID-19 declaration under Section 56 4(b)(1) of the Act, 21 U.S.C. section 360bbb-3(b)(1), unless the authorization is terminated or revoked sooner. Performed at [facility], [address], [place], [place] [address]   Comprehensive metabolic panel     Status: Abnormal   Collection Time: 12/20/19  3:35 AM  Result Value Ref Range   Sodium 139 135 - 145 mmol/L   Potassium 3.7 3.5 - 5.1 mmol/L    Chloride 105 98 - 111 mmol/L   CO2 25 22 - 32 mmol/L   Glucose, Bld 80 70 - 99 mg/dL    Comment: Glucose reference range applies only to samples taken after fasting for at least 8 hours.   BUN 14 6 - 20 mg/dL   Creatinine, Ser 3.73 0.61 - 1.24 mg/dL   Calcium 8.8 (L) 8.9 - 10.3 mg/dL   Total Protein 6.0 (L) 6.5 - 8.1 g/dL   Albumin 3.2 (L) 3.5 - 5.0 g/dL   AST 29 15 - 41 U/L   ALT 23 0 - 44 U/L   Alkaline Phosphatase 51 38 - 126 U/L   Total Bilirubin 1.0 0.3 - 1.2 mg/dL   GFR calc non Af Amer >60 >60 mL/min   GFR calc Af Amer >60 >60 mL/min   Anion gap 9 5 - 15    Comment: Performed at [facility], [address] [place], [place] [address]  CBC     Status: None   Collection Time: 12/20/19  3:35 AM  Result Value Ref Range   WBC 9.5 4.0 - 10.5 K/uL   RBC 4.75 4.22 - 5.81 MIL/uL   Hemoglobin 15.1 13.0 - 17.0 g/dL   HCT 81.1 57.2 - 62.0 %   MCV 93.9 80.0 - 100.0 fL   MCH 31.8 26.0 - 34.0 pg   MCHC 33.9 30.0 - 36.0 g/dL   RDW 35.5 97.4 - 16.3 %   Platelets 160 150 - 400 K/uL   nRBC 0.0 0.0 - 0.2 %    Comment: Performed at [facility], [address], [place], [place] [address]  HIV Antibody (routine testing w rflx)     Status: None   Collection Time: 12/20/19  3:35 AM  Result Value Ref Range   HIV Screen 4th Generation wRfx NON REACTIVE NON REACTIVE    Comment: Performed at [facility], [address], [place], [place] [address]  Rapid urine drug screen (hospital performed)  Status: Abnormal   Collection Time: 12/20/19  7:55 AM  Result Value Ref Range   Opiates NONE DETECTED NONE DETECTED   Cocaine NONE DETECTED NONE DETECTED   Benzodiazepines POSITIVE (A) NONE DETECTED   Amphetamines NONE DETECTED NONE DETECTED   Tetrahydrocannabinol POSITIVE (A) NONE DETECTED   Barbiturates NONE DETECTED NONE DETECTED    Comment: (NOTE) DRUG SCREEN FOR MEDICAL PURPOSES ONLY.  IF CONFIRMATION IS NEEDED FOR ANY PURPOSE, NOTIFY LAB WITHIN 5 DAYS. LOWEST DETECTABLE  LIMITS FOR URINE DRUG SCREEN Drug Class                     Cutoff (ng/mL) Amphetamine and metabolites    1000 Barbiturate and metabolites    200 Benzodiazepine                 161 Tricyclics and metabolites     300 Opiates and metabolites        300 Cocaine and metabolites        300 THC                            50 Performed at [facility], [place] [address] [place], [place] [address]   Urinalysis, Routine w reflex microscopic     Status: Abnormal   Collection Time: 12/20/19  7:55 AM  Result Value Ref Range   Color, Urine YELLOW YELLOW   APPearance CLEAR CLEAR   Specific Gravity, Urine 1.031 (H) 1.005 - 1.030   pH 6.0 5.0 - 8.0   Glucose, UA NEGATIVE NEGATIVE mg/dL   Hgb urine dipstick NEGATIVE NEGATIVE   Bilirubin Urine NEGATIVE NEGATIVE   Ketones, ur 20 (A) NEGATIVE mg/dL   Protein, ur 30 (A) NEGATIVE mg/dL   Nitrite NEGATIVE NEGATIVE   Leukocytes,Ua NEGATIVE NEGATIVE   RBC / HPF 0-5 0 - 5 RBC/hpf   WBC, UA 0-5 0 - 5 WBC/hpf   Bacteria, UA NONE SEEN NONE SEEN   Mucus PRESENT     Comment: Performed at [place] [address] [place], [place] [address]    Medications:  Current Facility-Administered Medications  Medication Dose Route Frequency Provider Last Rate Last Admin  . 0.9 %  sodium chloride infusion   Intravenous Continuous Elwyn Reach, MD 75 mL/hr at 12/20/19 0303 New Bag at 12/20/19 0303  . acetaminophen (TYLENOL) tablet 650 mg  650 mg Oral Q6H PRN Elwyn Reach, MD       Or  . acetaminophen (TYLENOL) suppository 650 mg  650 mg Rectal Q6H PRN Garba, Mohammad L, MD      . albuterol (VENTOLIN HFA) 108 (90 Base) MCG/ACT inhaler 2 puff  2 puff Inhalation PRN Gala Romney L, MD      . benztropine (COGENTIN) tablet 1 mg  1 mg Oral See admin instructions Elwyn Reach, MD      . cholecalciferol (VITAMIN D3) tablet 5,000 Units  5,000 Units Oral Daily Elwyn Reach, MD   5,000 Units at 12/20/19 0934  . clonazePAM (KLONOPIN) tablet 1  mg  1 mg Oral TID PRN Elwyn Reach, MD      . divalproex (DEPAKOTE ER) 24 hr tablet 1,500 mg  1,500 mg Oral QHS Gala Romney L, MD   1,500 mg at 12/20/19 0259  . enoxaparin (LOVENOX) injection 40 mg  40 mg Subcutaneous Daily Elwyn Reach, MD   40 mg at 12/20/19 0938  . fluticasone  furoate-vilanterol (BREO ELLIPTA) 200-25 MCG/INH 1 puff  1 puff Inhalation Daily Garba, Mohammad L, MD      . lamoTRIgine (LAMICTAL) tablet 50 mg  50 mg Oral BID Rometta Emery, MD   50 mg at 12/20/19 0937  . meloxicam (MOBIC) tablet 15 mg  15 mg Oral Q breakfast Garba, Mohammad L, MD      . ondansetron (ZOFRAN) tablet 4 mg  4 mg Oral Q6H PRN Rometta Emery, MD       Or  . ondansetron (ZOFRAN) injection 4 mg  4 mg Intravenous Q6H PRN Earlie Lou L, MD      . pravastatin (PRAVACHOL) tablet 10 mg  10 mg Oral Daily Rometta Emery, MD   10 mg at 12/20/19 0934  . risperiDONE (RISPERDAL) tablet 2 mg  2 mg Oral BID Rometta Emery, MD   2 mg at 12/20/19 4665   Current Outpatient Medications  Medication Sig Dispense Refill  . albuterol (PROVENTIL HFA;VENTOLIN HFA) 108 (90 Base) MCG/ACT inhaler Inhale 2 puffs into the lungs as needed for wheezing or shortness of breath. 1 Inhaler 6  . benztropine (COGENTIN) 1 MG tablet Take 1 mg by mouth See admin instructions. 1 tablet in the morning and 2 tablets at bedtime.    . budesonide-formoterol (SYMBICORT) 160-4.5 MCG/ACT inhaler Inhale 2 puffs into the lungs daily. 1 Inhaler 6  . Cholecalciferol (VITAMIN D) 125 MCG (5000 UT) CAPS Take 1 tablet by mouth daily.    . clonazePAM (KLONOPIN) 1 MG tablet Take 1 mg by mouth in the morning and at bedtime.     . divalproex (DEPAKOTE ER) 500 MG 24 hr tablet 1,500 mg at bedtime.   5  . lamoTRIgine (LAMICTAL) 25 MG tablet Take 2 tablets (50 mg total) by mouth 2 (two) times daily. 360 tablet 3  . meloxicam (MOBIC) 15 MG tablet Take 1 tablet (15 mg total) by mouth daily. 30 tablet 5  . pravastatin (PRAVACHOL) 10 MG  tablet TAKE 1 TABLET BY MOUTH EVERY DAY (Patient taking differently: Take 10 mg by mouth daily. ) 90 tablet 0  . risperiDONE (RISPERDAL) 2 MG tablet Take 2 mg by mouth 2 (two) times daily.       Musculoskeletal: Strength & Muscle Tone: within normal limits Gait & Station: unable to assess Patient leans: N/A  Psychiatric Specialty Exam: Physical Exam  Nursing note and vitals reviewed. Constitutional: He is oriented to person, place, and time. He appears well-developed.  HENT:  Head: Normocephalic.  Cardiovascular: Normal rate.  Respiratory: Effort normal.  Musculoskeletal:        General: Normal range of motion.     Cervical back: Normal range of motion.  Neurological: He is alert and oriented to person, place, and time.  Psychiatric: His affect is angry and labile. His speech is rapid and/or pressured and tangential. He is agitated and aggressive. Thought content is paranoid. He expresses impulsivity.    Review of Systems  Constitutional: Negative.   HENT: Negative.   Eyes: Negative.   Respiratory: Negative.   Cardiovascular: Negative.   Gastrointestinal: Negative.   Genitourinary: Negative.   Musculoskeletal: Negative.   Skin: Negative.   Neurological: Negative.   Psychiatric/Behavioral: Positive for agitation.    Blood pressure 106/67, pulse 68, temperature 98.1 F (36.7 C), temperature source Oral, resp. rate 14, height 5\' 9"  (1.753 m), weight 54.4 kg, SpO2 96 %.Body mass index is 17.72 kg/m.  General Appearance: Disheveled  Eye Contact:  Good  Speech:  Pressured  Volume:  Increased  Mood:  Angry and Irritable  Affect:  Labile  Thought Process:  Disorganized and Descriptions of Associations: Tangential  Orientation:  Full (Time, Place, and Person)  Thought Content:  Rumination and Tangential  Suicidal Thoughts:  No  Homicidal Thoughts:  No  Memory:  Immediate;   Fair Recent;   Fair Remote;   Fair  Judgement:  Impaired  Insight:  Lacking  Psychomotor Activity:   Normal  Concentration:  Concentration: Fair and Attention Span: Poor  Recall:  [employer] of Knowledge:  Fair  Language:  Fair  Akathisia:  No  Handed:  Right  AIMS (if indicated):     Assets:  Communication Skills Desire for Improvement Housing Intimacy Physical Health Resilience Social Support  ADL's:  Intact  Cognition:  Impaired,  Mild  Sleep:        Treatment Plan Summary: Patient discussed with Dr Lucianne Muss. Medication management Recommend modify Risperdal to 0.5mg  0800, Risperdal 0.5mg  1200 and Rispersal  QHS.   Disposition: Please consult psychiatry as needed.   This service was provided via telemedicine using a 2-way, interactive audio and video technology.  Names of all persons participating in this telemedicine service and their role in this encounter. Name: Micheal Webb Role: Patient  Name: Micheal [place]- Via telephone Role: Patient's mother  Name: Berneice Heinrich Role: FNP    Patrcia Dolly, FNP 12/20/2019 10:58 AM

## 2019-12-20 NOTE — ED Notes (Signed)
Pt mother would like to be called when he is moved, if she's not here.

## 2019-12-20 NOTE — Progress Notes (Signed)
Subjective: No significant changes  Exam: Vitals:   12/20/19 0953 12/20/19 1000  BP: (!) 106/55 106/67  Pulse: 73 68  Resp: 14   Temp: 98.1 F (36.7 C)   SpO2: 98% 96%   Gen: In bed, NAD Resp: non-labored breathing, no acute distress Abd: soft, nt  Neuro: MS: awake, alert, oriented IZ:TIWPY, EOMI Motor: no drift in BUE Sensory:intact to LT in UE  MRI with multiple lesions, some infratentorial some supratentorial consistent with demyelinating lesions.  He has some enhancing and some nonenhancing lesions.  Impression: 44 year old male with recent slurred speech and trouble walking with enhancing lesions on MRI consistent with multiple sclerosis.  Given the presence of enhancing/nonenhancing lesions in multiple territories, this fufills dissemination in time and space consistent with MS.  I will start him on pulse dose Solu-Medrol he will need physical therapy.  Recommendations: 1) Solu-Medrol 1 g daily for 3 to 5 days 2) physical therapy 3) following discharge he will likely need disease modifying therapy.  Ritta Slot, MD [facility] [phone]  If 7pm- 7am, please page neurology on call as listed in AMION.

## 2019-12-20 NOTE — Progress Notes (Signed)
Patient adamant about leaving facility.  IV removed.  Patient ran out back door before staff could stop him.

## 2019-12-20 NOTE — ED Notes (Signed)
Bladder Scan 

## 2019-12-20 NOTE — ED Notes (Signed)
Encouraged pt to lower voice as pt has been yelling and cursing loudly.

## 2019-12-20 NOTE — ED Notes (Signed)
Lunch Tray Ordered @ 1026. 

## 2019-12-20 NOTE — Procedures (Signed)
Patient Name: Micheal Webb  MRN: [id number]  Epilepsy Attending: Charlsie Quest  Referring Physician/Provider: Dr. Caryl Pina  Date: 12/20/2019 Duration: 26 minutes  Patient history: 44 year old male with known seizure disorder who presented with lethargy, gait imbalance and slurred speech.  EEG evaluate for seizures.  Level of alertness: Awake, asleep  AEDs during EEG study: Depakote, lamotrigine  Technical aspects: This EEG study was done with scalp electrodes positioned according to the 10-20 International system of electrode placement. Electrical activity was acquired at a sampling rate of 500Hz  and reviewed with a high frequency filter of 70Hz  and a low frequency filter of 1Hz . EEG data were recorded continuously and digitally stored.   Description: The posterior dominant rhythm consists of 7 Hz activity of moderate voltage (25-35 uV) seen predominantly in posterior head regions, symmetric and reactive to eye opening and eye closing.  Sleep was characterized by vertex waves, sleep spindles (12 to 14 Hz), maximal frontocentral region.  Hyperventilation and photic stimulation were not performed.  Abnormality -Background slow  IMPRESSION: This study is suggestive of mild diffuse encephalopathy, nonspecific etiology. No seizures or epileptiform discharges were seen throughout the recording.  Tamakia Porto 

## 2019-12-20 NOTE — ED Notes (Signed)
Advised attending pt not tolerating MRI of c-spine. Awaiting orders

## 2019-12-20 NOTE — Discharge Summary (Signed)
Physician Discharge Summary  Micheal Webb:[id number] DOB: [birth year] DOA: 12/19/2019  PCP: Minette Brine, FNP  Admit date: 12/19/2019 Discharge date: 12/20/2019  Left Against Medical Advice    Brief/Interim Summary: Please see admit H and P from 4/12 for details. Briefly 44yo with hx schizophrenia, sizures, carpal tunnel who presented with slurred speech and increased lethargy. Neurology was consulted. MRI confirmed new diagnosis of MS  Discharge Diagnoses:  Principal Problem:   AMS (altered mental status) Active Problems:   Generalized convulsive epilepsy ([place])   Mixed hyperlipidemia   Schizophrenia ([place])  1. New diagnosis of MS 1. Neurology had been following. 2. Pt underwent MRI, reviewed. Brain with findings of numerous supratentorial and infratentorial white matter lesions consistent with demyelinating disease 3. Discussed with Neurology. Planned for IV steroids x 3 days 4. MRI cervical and thoracic spine ordered, and was pending 5. On the afternoon of 4/13, patient elected to leave the floor via back stairwell. 911 was called and patient was later apprehended down the street from the hospital 2. Hx seizure disorder 1. S/p EEG, reviewed. No seizures 2. Cont on depakote while in hospital 3. HLD 1. Stable at present 2. Continued on pravachol 4. Schizophrenia 1. Psychiatry consulted. Recommendation to change Risperdal to 0.5mg  0800, 0.5mg  1200, Risperdal 2mg  QHS 5. Tobacco abuse 1. Needs cessation when more willing to participate in conversation 2. Declined nicotine patch   Discharge Instructions    No Known Allergies  Consultations:  Psychiatry  Neurology  Procedures/Studies: EEG  Result Date: 12/20/2019 Lora Havens, MD     12/20/2019 10:43 AM Patient Name: Micheal Webb MRN: [id number] Epilepsy Attending: Lora Havens Referring Physician/Provider: Dr. Kerney Elbe Date: 12/20/2019 Duration: 26 minutes Patient history: 44 year old male with  known seizure disorder who presented with lethargy, gait imbalance and slurred speech.  EEG evaluate for seizures. Level of alertness: Awake, asleep AEDs during EEG study: Depakote, lamotrigine Technical aspects: This EEG study was done with scalp electrodes positioned according to the 10-20 International system of electrode placement. Electrical activity was acquired at a sampling rate of 500Hz  and reviewed with a high frequency filter of 70Hz  and a low frequency filter of 1Hz . EEG data were recorded continuously and digitally stored. Description: The posterior dominant rhythm consists of 7 Hz activity of moderate voltage (25-35 uV) seen predominantly in posterior head regions, symmetric and reactive to eye opening and eye closing.  Sleep was characterized by vertex waves, sleep spindles (12 to 14 Hz), maximal frontocentral region.  Hyperventilation and photic stimulation were not performed. Abnormality -Background slow IMPRESSION: This study is suggestive of mild diffuse encephalopathy, nonspecific etiology. No seizures or epileptiform discharges were seen throughout the recording. Lora Havens   CT Head Wo Contrast  Result Date: 12/19/2019 CLINICAL DATA:  Altered mental status EXAM: CT HEAD WITHOUT CONTRAST CT CERVICAL SPINE WITHOUT CONTRAST TECHNIQUE: Multidetector CT imaging of the head and cervical spine was performed following the standard protocol without intravenous contrast. Multiplanar CT image reconstructions of the cervical spine were also generated. COMPARISON:  None. FINDINGS: CT HEAD FINDINGS Brain: No evidence of acute infarction, hemorrhage, hydrocephalus, extra-axial collection or mass lesion/mass effect. Mild periventricular and deep white matter hypodensity. Vascular: No hyperdense vessel or unexpected calcification. Skull: Normal. Negative for fracture or focal lesion. Sinuses/Orbits: No acute finding. Other: None. CT CERVICAL SPINE FINDINGS Alignment: Normal. Skull base and vertebrae:  No acute fracture. No primary bone lesion or focal pathologic process. Soft tissues and spinal canal: No prevertebral fluid or  swelling. No visible canal hematoma. Disc levels: Mild multilevel disc space height loss and osteophytosis. Upper chest: Negative. Other: None. IMPRESSION: 1. No acute intracranial pathology. Periventricular and deep white matter hypodensity disease, generally advanced for patient age, which may be related to hypertensive small-vessel white matter disease or alternately demyelinating disorder. Correlate with clinical history. MRI may be used to further assess if desired. 2.  No fracture or static subluxation of the cervical spine. Electronically Signed   By: Lauralyn Primes M.D.   On: 12/19/2019 18:43   CT CERVICAL SPINE WO CONTRAST  Result Date: 12/19/2019 CLINICAL DATA:  Altered mental status EXAM: CT HEAD WITHOUT CONTRAST CT CERVICAL SPINE WITHOUT CONTRAST TECHNIQUE: Multidetector CT imaging of the head and cervical spine was performed following the standard protocol without intravenous contrast. Multiplanar CT image reconstructions of the cervical spine were also generated. COMPARISON:  None. FINDINGS: CT HEAD FINDINGS Brain: No evidence of acute infarction, hemorrhage, hydrocephalus, extra-axial collection or mass lesion/mass effect. Mild periventricular and deep white matter hypodensity. Vascular: No hyperdense vessel or unexpected calcification. Skull: Normal. Negative for fracture or focal lesion. Sinuses/Orbits: No acute finding. Other: None. CT CERVICAL SPINE FINDINGS Alignment: Normal. Skull base and vertebrae: No acute fracture. No primary bone lesion or focal pathologic process. Soft tissues and spinal canal: No prevertebral fluid or swelling. No visible canal hematoma. Disc levels: Mild multilevel disc space height loss and osteophytosis. Upper chest: Negative. Other: None. IMPRESSION: 1. No acute intracranial pathology. Periventricular and deep white matter hypodensity  disease, generally advanced for patient age, which may be related to hypertensive small-vessel white matter disease or alternately demyelinating disorder. Correlate with clinical history. MRI may be used to further assess if desired. 2.  No fracture or static subluxation of the cervical spine. Electronically Signed   By: Lauralyn Primes M.D.   On: 12/19/2019 18:43   MR BRAIN W WO CONTRAST  Result Date: 12/20/2019 CLINICAL DATA:  Encephalopathy. EXAM: MRI HEAD WITHOUT AND WITH CONTRAST TECHNIQUE: Multiplanar, multiecho pulse sequences of the brain and surrounding structures were obtained without and with intravenous contrast. CONTRAST:  10mL GADAVIST GADOBUTROL 1 MMOL/ML IV SOLN COMPARISON:  None. FINDINGS: BRAIN: No acute infarct, acute hemorrhage or extra-axial collection. There is severe white matter disease with many lesions oriented perpendicularly to the long axis of the lateral ventricles. There are multiple contrast-enhancing lesions, most of which have a ring shaped morphology. These lesions are located at the left cerebral peduncle, right temporal root and multiple locations in the left hemispheric white matter. There are proximally 8 enhancing lesions. Normal volume of brain parenchyma and CSF spaces. There is at least 2 infratentorial lesions. There is no abnormal diffusion restriction associated with the lesions. The enhancing lesions actually show facilitated diffusion. VASCULAR: Major flow voids are preserved. Susceptibility-sensitive sequences show no chronic microhemorrhage or superficial siderosis. SKULL AND UPPER CERVICAL SPINE: Normal calvarium and skull base. Visualized upper cervical spine and soft tissues are normal. SINUSES/ORBITS: No paranasal sinus fluid levels or advanced mucosal thickening. No mastoid or middle ear effusion. Normal orbits. IMPRESSION: 1. Numerous supratentorial and infratentorial white matter lesions in a pattern most consistent with demyelinating disease. An infectious or  inflammatory encephalitis is considered less likely. 2. No acute ischemia. Electronically Signed   By: Deatra Robinson M.D.   On: 12/20/2019 02:09     Discharge Exam: Vitals:   12/20/19 1500 12/20/19 1738  BP: 101/66 117/66  Pulse: 60 66  Resp: 18   Temp:    SpO2: 96% 99%  Vitals:   12/20/19 0953 12/20/19 1000 12/20/19 1500 12/20/19 1738  BP: (!) 106/55 106/67 101/66 117/66  Pulse: 73 68 60 66  Resp: 14  18   Temp: 98.1 F (36.7 C)     TempSrc: Oral     SpO2: 98% 96% 96% 99%  Weight:      Height:        The results of significant diagnostics from this hospitalization (including imaging, microbiology, ancillary and laboratory) are listed below for reference.     Microbiology: Recent Results (from the past 240 hour(s))  SARS CORONAVIRUS 2 (TAT 6-24 HRS) Nasopharyngeal Nasopharyngeal Swab     Status: None   Collection Time: 12/19/19 10:11 PM   Specimen: Nasopharyngeal Swab  Result Value Ref Range Status   SARS Coronavirus 2 NEGATIVE NEGATIVE Final    Comment: (NOTE) SARS-CoV-2 target nucleic acids are NOT DETECTED. The SARS-CoV-2 RNA is generally detectable in upper and lower respiratory specimens during the acute phase of infection. Negative results do not preclude SARS-CoV-2 infection, do not rule out co-infections with other pathogens, and should not be used as the sole basis for treatment or other patient management decisions. Negative results must be combined with clinical observations, patient history, and epidemiological information. The expected result is Negative. Fact Sheet for Patients: HairSlick.no Fact Sheet for Healthcare Providers: quierodirigir.com This test is not yet approved or cleared by the [place] FDA and  has been authorized for detection and/or diagnosis of SARS-CoV-2 by FDA under an Emergency Use Authorization (EUA). This EUA will remain  in effect (meaning this test can be used) for  the duration of the COVID-19 declaration under Section 56 4(b)(1) of the Act, 21 U.S.C. section 360bbb-3(b)(1), unless the authorization is terminated or revoked sooner. Performed at [facility], [address] [place], [place] [address]      Labs: BNP (last 3 results) No results for input(s): BNP in the last 8760 hours. Basic Metabolic Panel: Recent Labs  Lab 12/19/19 1404 12/19/19 1842 12/20/19 0335  NA 141  --  139  K 3.9  --  3.7  CL 101  --  105  CO2 30  --  25  GLUCOSE 105*  --  80  BUN 11  --  14  CREATININE 0.89  --  0.78  CALCIUM 9.4  --  8.8*  MG  --  2.1  --    Liver Function Tests: Recent Labs  Lab 12/19/19 1404 12/20/19 0335  AST 38 29  ALT 29 23  ALKPHOS 59 51  BILITOT 0.8 1.0  PROT 6.9 6.0*  ALBUMIN 3.8 3.2*   No results for input(s): LIPASE, AMYLASE in the last 168 hours. No results for input(s): AMMONIA in the last 168 hours. CBC: Recent Labs  Lab 12/19/19 1404 12/20/19 0335  WBC 7.8 9.5  HGB 15.2 15.1  HCT 44.4 44.6  MCV 94.3 93.9  PLT 172 160   Cardiac Enzymes: No results for input(s): CKTOTAL, CKMB, CKMBINDEX, TROPONINI in the last 168 hours. BNP: Invalid input(s): POCBNP CBG: No results for input(s): GLUCAP in the last 168 hours. D-Dimer No results for input(s): DDIMER in the last 72 hours. Hgb A1c No results for input(s): HGBA1C in the last 72 hours. Lipid Profile No results for input(s): CHOL, HDL, LDLCALC, TRIG, CHOLHDL, LDLDIRECT in the last 72 hours. Thyroid function studies No results for input(s): TSH, T4TOTAL, T3FREE, THYROIDAB in the last 72 hours.  Invalid input(s): FREET3 Anemia work up No results for input(s):  VITAMINB12, FOLATE, FERRITIN, TIBC, IRON, RETICCTPCT in the last 72 hours. Urinalysis    Component Value Date/Time   COLORURINE YELLOW 12/20/2019 0755   APPEARANCEUR CLEAR 12/20/2019 0755   LABSPEC 1.031 (H) 12/20/2019 0755   PHURINE 6.0 12/20/2019 0755   GLUCOSEU NEGATIVE 12/20/2019 0755    HGBUR NEGATIVE 12/20/2019 0755   BILIRUBINUR NEGATIVE 12/20/2019 0755   KETONESUR 20 (A) 12/20/2019 0755   PROTEINUR 30 (A) 12/20/2019 0755   UROBILINOGEN 0.2 10/24/2007 0018   NITRITE NEGATIVE 12/20/2019 0755   LEUKOCYTESUR NEGATIVE 12/20/2019 0755   Sepsis Labs Invalid input(s): PROCALCITONIN,  WBC,  LACTICIDVEN Microbiology Recent Results (from the past 240 hour(s))  SARS CORONAVIRUS 2 (TAT 6-24 HRS) Nasopharyngeal Nasopharyngeal Swab     Status: None   Collection Time: 12/19/19 10:11 PM   Specimen: Nasopharyngeal Swab  Result Value Ref Range Status   SARS Coronavirus 2 NEGATIVE NEGATIVE Final    Comment: (NOTE) SARS-CoV-2 target nucleic acids are NOT DETECTED. The SARS-CoV-2 RNA is generally detectable in upper and lower respiratory specimens during the acute phase of infection. Negative results do not preclude SARS-CoV-2 infection, do not rule out co-infections with other pathogens, and should not be used as the sole basis for treatment or other patient management decisions. Negative results must be combined with clinical observations, patient history, and epidemiological information. The expected result is Negative. Fact Sheet for Patients: HairSlick.no Fact Sheet for Healthcare Providers: quierodirigir.com This test is not yet approved or cleared by the [place] FDA and  has been authorized for detection and/or diagnosis of SARS-CoV-2 by FDA under an Emergency Use Authorization (EUA). This EUA will remain  in effect (meaning this test can be used) for the duration of the COVID-19 declaration under Section 56 4(b)(1) of the Act, 21 U.S.C. section 360bbb-3(b)(1), unless the authorization is terminated or revoked sooner. Performed at [facility], [address] [place], [place] [address]      SIGNED:   Rickey Barbara, MD  [facility] 12/20/2019, 6:11 PM  If 7PM-7AM, please contact  night-coverage

## 2019-12-20 NOTE — ED Notes (Signed)
Pt yelling out, screaming at staff "get me out of here! I don't want to be here." Pt redirected by staff, offered patient to contact mother as he as requested.

## 2019-12-20 NOTE — Progress Notes (Signed)
EEG complete - results pending 

## 2019-12-20 NOTE — ED Notes (Signed)
Pt given sandwich, crackers and sprite. Tolerated well.

## 2019-12-20 NOTE — Progress Notes (Signed)
Brought Mr. Mahmood to MRI. He is unable to hold still for 2 hour long scan, and would not lay on his back. Called RN to let know of progress and that we are sending patient back to his room. Will attempt scan at a later time in coordination with his medication.
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
## 2019-12-20 NOTE — ED Notes (Signed)
Breakfast ordered 

## 2019-12-21 ENCOUNTER — Telehealth: Payer: Self-pay | Admitting: Family Medicine

## 2019-12-21 NOTE — Telephone Encounter (Signed)
Pt's mother Throckmorton,Pamhas called re: pt's most recent hospitalization.  Mother has called to relay when pt was last at the hospital a CT scan and a MRI were ran and a Dr Amada Jupiter informed pt mother he believed pt to have MS.  Pt's mother states pt left the hospital and discharged himself as a result of hearing this.  Pt's mother is asking for a call to discuss what needs to be done next.  Please call

## 2019-12-21 NOTE — Telephone Encounter (Signed)
I spoke with patient's mother. He was evaluated in the ER after our office follow up on 4/12. MRI showed "Brain with findings of numerous supratentorial and infratentorial white matter lesions consistent with demyelinating disease". Patient left hospital via stairway. He was apprehended and taken back to the ER. He left AMA. He is now at home at his appt. She is unsure if he has continued his psychiatric medications. She feels that he is confused and not able to make rational decisions. I have advised that she reach out to psychiatry immediately for further guidance. I anticipate he may need IVC for further assessment of mental health and treatment for newly diagnosed MS. She verbalizes understanding and will call us with updates.

## 2019-12-22 ENCOUNTER — Other Ambulatory Visit: Payer: Self-pay

## 2019-12-22 ENCOUNTER — Emergency Department (HOSPITAL_COMMUNITY)
Admission: EM | Admit: 2019-12-22 | Discharge: 2019-12-27 | Disposition: A | Discharge status: Home / Self Care | Payer: Medicaid Other | Attending: Emergency Medicine | Admitting: Emergency Medicine

## 2019-12-22 ENCOUNTER — Encounter (HOSPITAL_COMMUNITY): Payer: Self-pay | Admitting: Emergency Medicine

## 2019-12-22 DIAGNOSIS — Z046 Encounter for general psychiatric examination, requested by authority: Secondary | ICD-10-CM | POA: Diagnosis present

## 2019-12-22 DIAGNOSIS — G35 Multiple sclerosis: Secondary | ICD-10-CM | POA: Diagnosis not present

## 2019-12-22 DIAGNOSIS — F209 Schizophrenia, unspecified: Secondary | ICD-10-CM | POA: Insufficient documentation

## 2019-12-22 DIAGNOSIS — Z20822 Contact with and (suspected) exposure to covid-19: Secondary | ICD-10-CM | POA: Insufficient documentation

## 2019-12-22 HISTORY — DX: Multiple sclerosis: G35

## 2019-12-22 LAB — CBC
HCT: 40.1 % (ref 39.0–52.0)
Hemoglobin: 14 g/dL (ref 13.0–17.0)
MCH: 32.9 pg (ref 26.0–34.0)
MCHC: 34.9 g/dL (ref 30.0–36.0)
MCV: 94.1 fL (ref 80.0–100.0)
Platelets: 167 10*3/uL (ref 150–400)
RBC: 4.26 MIL/uL (ref 4.22–5.81)
RDW: 13 % (ref 11.5–15.5)
WBC: 9.8 10*3/uL (ref 4.0–10.5)
nRBC: 0 % (ref 0.0–0.2)

## 2019-12-22 LAB — COMPREHENSIVE METABOLIC PANEL
ALT: 37 U/L (ref 0–44)
AST: 55 U/L — ABNORMAL HIGH (ref 15–41)
Albumin: 3.6 g/dL (ref 3.5–5.0)
Alkaline Phosphatase: 49 U/L (ref 38–126)
Anion gap: 9 (ref 5–15)
BUN: 15 mg/dL (ref 6–20)
CO2: 26 mmol/L (ref 22–32)
Calcium: 9 mg/dL (ref 8.9–10.3)
Chloride: 103 mmol/L (ref 98–111)
Creatinine, Ser: 0.89 mg/dL (ref 0.61–1.24)
GFR calc Af Amer: >60 mL/min (ref >60)
GFR calc non Af Amer: >60 mL/min (ref >60)
Glucose, Bld: 167 mg/dL — ABNORMAL HIGH (ref 70–99)
Potassium: 4.8 mmol/L (ref 3.5–5.1)
Sodium: 138 mmol/L (ref 135–145)
Total Bilirubin: 1.8 mg/dL — ABNORMAL HIGH (ref 0.3–1.2)
Total Protein: 6.1 g/dL — ABNORMAL LOW (ref 6.5–8.1)

## 2019-12-22 LAB — RESPIRATORY PANEL BY RT PCR (FLU A&B, COVID)
Influenza A by PCR: NEGATIVE
Influenza B by PCR: NEGATIVE
SARS Coronavirus 2 by RT PCR: NEGATIVE

## 2019-12-22 LAB — ETHANOL: Alcohol, Ethyl (B): <10 mg/dL (ref <10)

## 2019-12-22 LAB — ACETAMINOPHEN LEVEL: Acetaminophen (Tylenol), Serum: <10 ug/mL — ABNORMAL LOW (ref 10–30)

## 2019-12-22 LAB — SALICYLATE LEVEL: Salicylate Lvl: <7 mg/dL — ABNORMAL LOW (ref 7.0–30.0)

## 2019-12-22 MED ORDER — PREDNISONE 50 MG PO TABS: 1250.0000 mg | ORAL_TABLET | Freq: Every day | ORAL | 0 refills | Status: AC

## 2019-12-22 MED ORDER — LAMOTRIGINE 25 MG PO TABS
50.0000 mg | ORAL_TABLET | Freq: Two times a day (BID) | ORAL | Status: DC
  Administered 2019-12-23 – 2019-12-27 (×10): 50 mg via ORAL
  Filled 2019-12-22 (×10): qty 2

## 2019-12-22 MED ORDER — CLONAZEPAM 0.5 MG PO TABS
1.0000 mg | ORAL_TABLET | Freq: Every day | ORAL | Status: DC
  Administered 2019-12-23 – 2019-12-27 (×6): 1 mg via ORAL
  Filled 2019-12-22 (×6): qty 2

## 2019-12-22 MED ORDER — RISPERIDONE 1 MG PO TABS
2.0000 mg | ORAL_TABLET | Freq: Two times a day (BID) | ORAL | Status: DC
  Administered 2019-12-23 – 2019-12-27 (×10): 2 mg via ORAL
  Filled 2019-12-22 (×8): qty 2
  Filled 2019-12-22: qty 1
  Filled 2019-12-22 (×2): qty 2

## 2019-12-22 MED ORDER — DIVALPROEX SODIUM ER 500 MG PO TB24
1500.0000 mg | ORAL_TABLET | Freq: Every day | ORAL | Status: DC
  Administered 2019-12-23 – 2019-12-26 (×5): 1500 mg via ORAL
  Filled 2019-12-22 (×5): qty 3

## 2019-12-22 MED ORDER — BENZTROPINE MESYLATE 1 MG PO TABS: 1.0000 mg | ORAL_TABLET | ORAL | Status: DC

## 2019-12-22 MED ORDER — MELOXICAM 7.5 MG PO TABS
15.0000 mg | ORAL_TABLET | Freq: Every day | ORAL | Status: DC
  Administered 2019-12-23: 15 mg via ORAL
  Filled 2019-12-22: qty 2

## 2019-12-22 MED ORDER — PREDNISONE 50 MG PO TABS
1250.0000 mg | ORAL_TABLET | Freq: Once | ORAL | Status: AC
  Administered 2019-12-22: 1250 mg via ORAL
  Filled 2019-12-22: qty 1250
  Filled 2019-12-22: qty 25

## 2019-12-22 MED ORDER — VITAMIN D 25 MCG (1000 UNIT) PO TABS
125.0000 ug | ORAL_TABLET | Freq: Every day | ORAL | Status: DC
  Administered 2019-12-23 – 2019-12-27 (×5): 125 ug via ORAL
  Filled 2019-12-22 (×5): qty 5

## 2019-12-22 MED ORDER — PRAVASTATIN SODIUM 10 MG PO TABS
10.0000 mg | ORAL_TABLET | Freq: Every day | ORAL | Status: DC
  Administered 2019-12-23 – 2019-12-27 (×6): 10 mg via ORAL
  Filled 2019-12-22 (×6): qty 1
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
## 2019-12-22 NOTE — BH Assessment (Signed)
Clinician called at 2003 to request to have pt moved from the hallway to a room to complete his [facility] Assessment in confidence. Pt's nurse, Britney RN, requested 10 minutes to have pt moved. Clinician agreed and stated she would call the Tele-Assessment machine in 10 minutes.  Clinician called the Tele-Assessment machine at 2015 but it had not been moved; the nurse with the Tele-Assessment machine stated the system stated pt was still in the hallway. The nurse stated he would call clinician when pt had been moved to a room.

## 2019-12-22 NOTE — ED Triage Notes (Signed)
Pt arrives to ED via police. Pt is IVC'd by mother who state he has been hallucinating and paranoid. Pt is calm and cooperative in triage. Denies SI.HI. Pt has hx of epilepsy and MS. Police state pt left hospital the other day w IV still in arm. Pt denies any pain anywhere.

## 2019-12-22 NOTE — ED Notes (Signed)
Pt calm and cooperative at this time.

## 2019-12-22 NOTE — ED Notes (Signed)
Dinner Tray Ordered @ 1852. 

## 2019-12-23 DIAGNOSIS — G35 Multiple sclerosis: Secondary | ICD-10-CM

## 2019-12-23 LAB — VALPROIC ACID LEVEL: Valproic Acid Lvl: 88 ug/mL (ref 50.0–100.0)

## 2019-12-23 MED ORDER — BENZTROPINE MESYLATE 1 MG PO TABS
1.0000 mg | ORAL_TABLET | Freq: Every day | ORAL | Status: DC
  Administered 2019-12-23 – 2019-12-27 (×5): 1 mg via ORAL
  Filled 2019-12-23 (×5): qty 1

## 2019-12-23 MED ORDER — PREDNISONE 50 MG PO TABS: 1250.0000 mg | ORAL_TABLET | Freq: Every day | ORAL | Status: DC

## 2019-12-23 MED ORDER — BENZTROPINE MESYLATE 1 MG PO TABS
2.0000 mg | ORAL_TABLET | Freq: Every day | ORAL | Status: DC
  Administered 2019-12-23 – 2019-12-26 (×4): 2 mg via ORAL
  Filled 2019-12-23 (×4): qty 2

## 2019-12-23 MED ORDER — SODIUM CHLORIDE 0.9 % IV SOLN
1000.0000 mg | Freq: Every day | INTRAVENOUS | Status: AC
  Administered 2019-12-23 – 2019-12-27 (×5): 1000 mg via INTRAVENOUS
  Filled 2019-12-23 (×7): qty 8

## 2019-12-23 NOTE — ED Notes (Signed)
Lunch Tray Ordered @ 1047. 

## 2019-12-23 NOTE — ED Notes (Signed)
Called PT, vm gave pager number.
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
## 2019-12-23 NOTE — ED Notes (Signed)
Sitter at bedside.

## 2019-12-23 NOTE — ED Notes (Signed)
PT paged at this time, not 6:54 pm

## 2019-12-23 NOTE — ED Notes (Signed)
Paged PT.

## 2019-12-23 NOTE — ED Notes (Signed)
Junious Dresser father [phone] looking for an update

## 2019-12-23 NOTE — ED Notes (Signed)
Dinner Tray Ordered @ 1712. 

## 2019-12-23 NOTE — ED Notes (Signed)
Jannette Fogo mother [phone] would like an update

## 2019-12-23 NOTE — BH Assessment (Signed)
Per TTS, Discussed with Denzil Magnuson, PMHNP who states a disposition cannot be made until patient is medically cleared. TTS to re-evaluate when medically clear.  Patient's disposition is pending at this time.
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
## 2019-12-23 NOTE — ED Notes (Addendum)
Spoke with Dr Effie Shy regarding plan of care. Continue to monitor patient in the ED Doctor will speak to Neurology regarding admission medically.

## 2019-12-23 NOTE — ED Notes (Signed)
Asked pt if he needs a nicotine patch, he said no.

## 2019-12-23 NOTE — ED Notes (Signed)
IVC, Placement 12/23/19?  bfast ordered

## 2019-12-23 NOTE — ED Notes (Signed)
Per phone call from Dr Effie Shy, this pt will be getting two additional days of steroids and is not being admitted. The pt mother declined to have the pt return to her home. The pt needs PT/OT eval. The pt needs to have TTS.

## 2019-12-23 NOTE — ED Notes (Signed)
Patient denies pain and is resting comfortably.  

## 2019-12-23 NOTE — ED Notes (Signed)
Pt requested to call his father.

## 2019-12-23 NOTE — BHH Counselor (Signed)
TTS reassessment: Patient is alert and oriented x 4. He is dressed in scrubs and appears disheveled. His speech is rambling and incoherent at times. His mood and affect are irritable. He states "My mom pulled a fast one on me" referring to IVC. He denies SI/HI/AVH and states he wants to go home.   This counselor reviewed chart and discussed current status with RN. Patient had a neurology consult and MD believes some symptoms are a result of his new MS diagnosis. Patient started on IV medications and PT, OT consults are were placed.   Discussed with Denzil Magnuson, PMHNP who states a disposition cannot be made until patient is medically cleared. TTS to re-evaluate when medically clear.

## 2019-12-23 NOTE — ED Notes (Signed)
Requested PT/OT order from EDP's

## 2019-12-24 LAB — RAPID URINE DRUG SCREEN, HOSP PERFORMED
Amphetamines: NOT DETECTED
Barbiturates: NOT DETECTED
Benzodiazepines: POSITIVE — AB
Cocaine: NOT DETECTED
Opiates: NOT DETECTED
Tetrahydrocannabinol: POSITIVE — AB

## 2019-12-24 MED ORDER — NICOTINE 14 MG/24HR TD PT24
14.0000 mg | MEDICATED_PATCH | Freq: Once | TRANSDERMAL | Status: AC
  Administered 2019-12-24: 14 mg via TRANSDERMAL
  Filled 2019-12-24: qty 1

## 2019-12-24 NOTE — Evaluation (Signed)
Occupational Therapy Evaluation Patient Details Name: Micheal Webb MRN: [id number] DOB: [birth year] Today's Date: 12/24/2019    History of Present Illness Pt is a 44 y/o male presenting to the ED under IVC secondary to behavioral issues at home. Of note, pt with recent new diagnosis of MS on 12/20/19 but left AMA that hospital admission. This admission pt has been started on IV methylprednisolone. PMH including but not limited to schizophrenia, carpal tunnel syndrome and seizures.   Clinical Impression   Pt admitted with above diagnoses, presenting with MS exacerbation after leaving AMA from previous admission. Pt course is also complicated from baseline schiziophrenia, for pt presents with flight of ideas. This causes his thought processes to be tangential, with poor insight, safety, and awareness to current situation. Attempted brief education of MS diagnosis and its impact on BADLs, pt having difficulty processing this. Pt able to complete bed mobility at mod I and sit <> stands at min guard assist. Pt does present with slow steady functional mobility with poor dynamic balance. Also noted poor FMC control and difficulty with spillage during meals. Given current status, recommend pt receive follow up therapy in supportive living environment (like a group home). Per chart review pt is to be placed in inpatient facility, and recommend he continue therapy services there as well. Will continue to follow per POC listed below.    Follow Up Recommendations  Supervision/Assistance - 24 hour;Home health OT;Other (comment)(Pt is going to [facility] placement- will need continued therapy there. May benefit from group home placement post acute)    Equipment Recommendations  None recommended by OT    Recommendations for Other Services       Precautions / Restrictions Precautions Precautions: Fall Restrictions Weight Bearing Restrictions: No      Mobility Bed Mobility Overal bed mobility: Modified  Independent                Transfers Overall transfer level: Needs assistance Equipment used: None Transfers: Sit to/from Stand Sit to Stand: Min guard         General transfer comment: for safety with transition into standing from EOB    Balance Overall balance assessment: Needs assistance Sitting-balance support: Feet supported Sitting balance-Leahy Scale: Good     Standing balance support: During functional activity Standing balance-Leahy Scale: Fair Standing balance comment: static is fair, dynamic is poor. Increased difficulty with head turns                           ADL either performed or assessed with clinical judgement   ADL Overall ADL's : Needs assistance/impaired Eating/Feeding: Set up;Sitting   Grooming: Set up;Sitting   Upper Body Bathing: Set up;Sitting;Cueing for safety   Lower Body Bathing: Set up;Sit to/from stand;Sitting/lateral leans;Cueing for safety   Upper Body Dressing : Set up;Sitting;Cueing for safety   Lower Body Dressing: Set up;Sitting/lateral leans;Sit to/from stand;Cueing for safety Lower Body Dressing Details (indicate cue type and reason): to fix socks Toilet Transfer: Min guard;Ambulation;Cueing for safety   Toileting- Clothing Manipulation and Hygiene: Min guard;Sit to/from stand   Tub/ Shower Transfer: Min guard;Ambulation;Shower seat   Functional mobility during ADLs: Min guard;Cueing for safety       Vision Patient Visual Report: No change from baseline Vision Assessment?: No apparent visual deficits     Perception     Praxis      Pertinent Vitals/Pain Pain Assessment: No/denies pain Faces Pain Scale: No hurt  Hand Dominance Right   Extremity/Trunk Assessment Upper Extremity Assessment Upper Extremity Assessment: Generalized weakness(difficulty holding cups without lids)   Lower Extremity Assessment Lower Extremity Assessment: Generalized weakness       Communication  Communication Communication: No difficulties   Cognition Arousal/Alertness: Awake/alert Behavior During Therapy: Restless;Anxious Overall Cognitive Status: Impaired/Different from baseline Area of Impairment: Attention;Following commands;Safety/judgement;Problem solving;Awareness                   Current Attention Level: Selective Memory: Decreased short-term memory Following Commands: Follows one step commands consistently;Follows multi-step commands inconsistently;Follows multi-step commands with increased time Safety/Judgement: Decreased awareness of deficits;Decreased awareness of safety Awareness: Emergent Problem Solving: Requires verbal cues General Comments: Above cognitive status is suspected to be mostly result of psychosocial factor of schizophrenia diagnosis. Pt overall has poor insight, judgement, and awareness to current deficits and difficulty processing MS diagnosis (thinking it was muscular dystrophy)   General Comments       Exercises     Shoulder Instructions      Home Living Family/patient expects to be discharged to:: Private residence Living Arrangements: Alone Available Help at Discharge: Family;Neighbor;Available PRN/intermittently Type of Home: Apartment Home Access: Stairs to enter Entrance Stairs-Number of Steps: flight   Home Layout: One level                   Additional Comments: overall home info limited, pt having difficulty answering specific questions due to flight of ideas      Prior Functioning/Environment Level of Independence: Independent                 OT Problem List: Decreased strength;Decreased knowledge of use of DME or AE;Decreased coordination;Decreased knowledge of precautions;Decreased activity tolerance;Decreased cognition;Impaired UE functional use;Impaired balance (sitting and/or standing);Decreased safety awareness      OT Treatment/Interventions: Self-care/ADL training;Therapeutic  exercise;Patient/family education;Balance training;Neuromuscular education;Energy conservation;Therapeutic activities;DME and/or AE instruction;Cognitive remediation/compensation    OT Goals(Current goals can be found in the care plan section) Acute Rehab OT Goals Patient Stated Goal: to get better OT Goal Formulation: With patient Time For Goal Achievement: 01/07/20 Potential to Achieve Goals: Good  OT Frequency: Min 2X/week   Barriers to D/C:            Co-evaluation PT/OT/SLP Co-Evaluation/Treatment: Yes Reason for Co-Treatment: Necessary to address cognition/behavior during functional activity;For patient/therapist safety PT goals addressed during session: Balance;Mobility/safety with mobility;Strengthening/ROM OT goals addressed during session: ADL's and self-care      AM-PAC OT "6 Clicks" Daily Activity     Outcome Measure Help from another person eating meals?: None Help from another person taking care of personal grooming?: A Little Help from another person toileting, which includes using toliet, bedpan, or urinal?: A Little Help from another person bathing (including washing, rinsing, drying)?: A Little Help from another person to put on and taking off regular upper body clothing?: None Help from another person to put on and taking off regular lower body clothing?: A Little 6 Click Score: 20   End of Session Nurse Communication: Mobility status  Activity Tolerance: Patient tolerated treatment well Patient left: in bed;with call bell/phone within reach  OT Visit Diagnosis: Unsteadiness on feet (R26.81);Other abnormalities of gait and mobility (R26.89);Other symptoms and signs involving the nervous system (R29.898);Other symptoms and signs involving cognitive function                Time: 1030-1051 OT Time Calculation (min): 21 min Charges:  OT General Charges $OT Visit: 1 Visit OT Evaluation $  OT Eval Moderate Complexity: 1 Mod  Dalphine Handing, MSOT, OTR/L Acute  Rehabilitation Services [facility] Office Number: [phone] Pager: [phone]  Dalphine Handing 12/24/2019, 1:49 PM

## 2019-12-24 NOTE — ED Notes (Signed)
Re-TTS being performed.  

## 2019-12-24 NOTE — ED Notes (Signed)
Pt continues w/excessive talking to himself. Loudly at times.

## 2019-12-24 NOTE — Evaluation (Signed)
Physical Therapy Evaluation Patient Details Name: Micheal Webb MRN: [id number] DOB: 03-05-1976 Today's Date: 12/24/2019   History of Present Illness  Pt is a 44 y/o male presenting to the ED under IVC secondary to behavioral issues at home. Of note, pt with recent new diagnosis of MS on 12/20/19 but left AMA that hospital admission. This admission pt has been started on IV methylprednisolone. PMH including but not limited to schizophrenia, carpal tunnel syndrome and seizures.    Clinical Impression  Pt presented supine in bed with HOB elevated, awake and willing to participate in therapy session. Prior to admission, pt was ambulating independently; however, he did report that he was feeling weaker and off-balance. "I feel like an old man!" Difficult to fully gather all information and questionable accuracy as pt with impaired cognition throughout. No family/caregivers present either to provide information. At the time of evaluation, pt presenting with decreased strength, poor balance in standing and with gait, poor motor coordination and cognitive deficits that affected his overall functional mobility. He ambulated in hallway without use of an AD with close min guard for safety as he had several minor LOBs that he was able to self-correct with use of stepping strategy. Based on pt's current functional mobility status, would recommend 24/7 supervision and HHPT services. Pt would continue to benefit from skilled physical therapy services at this time while admitted and after d/c to address the below listed limitations in order to improve overall safety and independence with functional mobility.     Follow Up Recommendations Supervision/Assistance - 24 hour;Home health PT;Other (comment)(would benefit from a Group Home setting)    Equipment Recommendations  None recommended by PT    Recommendations for Other Services       Precautions / Restrictions Precautions Precautions:  Fall Restrictions Weight Bearing Restrictions: No      Mobility  Bed Mobility Overal bed mobility: Modified Independent                Transfers Overall transfer level: Needs assistance Equipment used: None Transfers: Sit to/from Stand Sit to Stand: Min guard         General transfer comment: for safety with transition into standing from EOB  Ambulation/Gait Ambulation/Gait assistance: Min guard Gait Distance (Feet): 200 Feet Assistive device: None Gait Pattern/deviations: Step-through pattern;Decreased stride length;Drifts right/left;Staggering right;Staggering left Gait velocity: decreased   General Gait Details: pt with modest instability with several minor LOBs throughout that he was able to self correct with use of stepping strategy  Stairs            Wheelchair Mobility    Modified Rankin (Stroke Patients Only)       Balance Overall balance assessment: Needs assistance Sitting-balance support: Feet supported Sitting balance-Leahy Scale: Good     Standing balance support: During functional activity Standing balance-Leahy Scale: Fair Standing balance comment: static is fair, dynamic is poor                             Pertinent Vitals/Pain Pain Assessment: Faces Faces Pain Scale: No hurt    Home Living Family/patient expects to be discharged to:: Private residence Living Arrangements: Alone Available Help at Discharge: Family;Neighbor;Available PRN/intermittently Type of Home: Apartment Home Access: Stairs to enter   Entrance Stairs-Number of Steps: flight Home Layout: One level        Prior Function Level of Independence: Independent  Hand Dominance   Dominant Hand: Right    Extremity/Trunk Assessment   Upper Extremity Assessment Upper Extremity Assessment: Defer to OT evaluation    Lower Extremity Assessment Lower Extremity Assessment: Generalized weakness       Communication    Communication: No difficulties  Cognition Arousal/Alertness: Awake/alert Behavior During Therapy: Restless Overall Cognitive Status: Impaired/Different from baseline Area of Impairment: Attention;Memory;Following commands;Safety/judgement;Problem solving;Awareness                   Current Attention Level: Selective Memory: Decreased short-term memory Following Commands: Follows one step commands consistently;Follows multi-step commands inconsistently;Follows multi-step commands with increased time Safety/Judgement: Decreased awareness of deficits;Decreased awareness of safety Awareness: Emergent Problem Solving: Requires verbal cues        General Comments      Exercises     Assessment/Plan    PT Assessment Patient needs continued PT services  PT Problem List Decreased strength;Decreased balance;Decreased mobility;Decreased coordination;Decreased cognition;Decreased safety awareness;Decreased knowledge of precautions       PT Treatment Interventions DME instruction;Gait training;Stair training;Functional mobility training;Therapeutic activities;Therapeutic exercise;Balance training;Neuromuscular re-education;Patient/family education;Cognitive remediation    PT Goals (Current goals can be found in the Care Plan section)  Acute Rehab PT Goals Patient Stated Goal: to get better PT Goal Formulation: With patient Time For Goal Achievement: 01/07/20 Potential to Achieve Goals: Good    Frequency Min 3X/week   Barriers to discharge Decreased caregiver support      Co-evaluation PT/OT/SLP Co-Evaluation/Treatment: Yes Reason for Co-Treatment: Necessary to address cognition/behavior during functional activity;For patient/therapist safety PT goals addressed during session: Balance;Mobility/safety with mobility;Strengthening/ROM         AM-PAC PT "6 Clicks" Mobility  Outcome Measure Help needed turning from your back to your side while in a flat bed without using  bedrails?: None Help needed moving from lying on your back to sitting on the side of a flat bed without using bedrails?: None Help needed moving to and from a bed to a chair (including a wheelchair)?: None Help needed standing up from a chair using your arms (e.g., wheelchair or bedside chair)?: None Help needed to walk in hospital room?: A Little Help needed climbing 3-5 steps with a railing? : A Lot 6 Click Score: 21    End of Session   Activity Tolerance: Patient tolerated treatment well Patient left: in bed;with call bell/phone within reach Nurse Communication: Mobility status PT Visit Diagnosis: Other abnormalities of gait and mobility (R26.89);Muscle weakness (generalized) (M62.81)    Time: 1030-1051 PT Time Calculation (min) (ACUTE ONLY): 21 min   Charges:   PT Evaluation $PT Eval Moderate Complexity: 1 Mod          Eduard Clos, PT, DPT  Acute Rehabilitation Services Pager [phone] Office [place] 12/24/2019, 12:48 PM

## 2019-12-24 NOTE — ED Notes (Signed)
Pt ate breakfast earlier and is now eating snack given as requested.

## 2019-12-24 NOTE — ED Notes (Signed)
Pt sleeping soundly. No distress noted.  

## 2019-12-24 NOTE — ED Notes (Signed)
In to speak with pt. Pt does not want to speak with this RN. "Just leave me alone". Pt has 2 trays at bedside. States that he is not hungry. Asked pt to drink some of his water at least. Pt did drink a cup of water. Pt refusing to take his medications. "I don't need that stuff". Explained to pt that it would be best if he took them and then I would just go and leave him alone. Pt agreeable to that. States "You know, there are more worse people out there than me?". This RN assured pt that he was not a bad person and that we are not here to judge him, only to help him. Gave pt an extra blanket. Pt still refusing to eat.

## 2019-12-24 NOTE — ED Notes (Signed)
PT w/pt.  

## 2019-12-24 NOTE — Progress Notes (Signed)
Subjective: He feels slightly better  Exam: Vitals:   12/24/19 0700 12/24/19 1227  BP: 99/60 120/68  Pulse: 61 90  Resp: 16 20  Temp: 97.9 F (36.6 C) 98.2 F (36.8 C)  SpO2: 97% 99%   Gen: In bed, NAD Resp: non-labored breathing, no acute distress Abd: soft, nt  Neuro: MS: awake, alert, interactive and appropriate CN: Pupils equal round and reactive, extraocular movements intact Motor: Does not give good effort, but moves all extremities spontaneously Sensory: Intact light touch   Impression: 44 year old male with gait dysfunction for the past few weeks with MRI consistent with multiple sclerosis.  He will need disease modifying therapy eventually, but in the acute phase, steroids for his current flare indicated.  Recommendations: 1) if he is still inpatient, would do IV Solu-Medrol 1 g tomorrow, otherwise 1250mg  of prednisone p.o. 2) following this, he should follow-up with his outpatient neurologist for consideration of disease modifying therapy 3) please call if neurology can be of any further assistance.  , MD [facility] [phone]  If 7pm- 7am, please page neurology on call as listed in AMION.

## 2019-12-24 NOTE — Discharge Planning (Signed)
EDCM to follow for disposition needs.  

## 2019-12-24 NOTE — ED Notes (Signed)
IVC  bfast ordered  

## 2019-12-24 NOTE — ED Notes (Signed)
Pt lying on bed continuing to talk to himself - loudly at times.

## 2019-12-24 NOTE — Progress Notes (Signed)
Patient meets criteria for inpatient treatment per Hillery Jacks, NP. No appropriate beds at [facility] currently. CSW faxed referrals to the following facilities for review:  [facility]  [facility]  [facility]   [facility]   [facility]   [facility]  [facility]  [facility]  [facility]   [facility]  [facility]   [facility]   [facility]   [facility]   [facility]   [facility]    TTS will continue to seek bed placement.     Ruthann Cancer MSW, [facility] Clincal Social Worker Disposition  [facility] Ph: [phone] Fax: [phone] 12/24/2019 1:17 PM

## 2019-12-24 NOTE — ED Notes (Signed)
Pt noted to be sleeping. Pt cursed when breakfast tray brought into room - refusing to eat.

## 2019-12-24 NOTE — BHH Counselor (Signed)
Disposition:   Per Hillery Jacks, NP, patient meet criteria for inpatient treatment

## 2019-12-25 NOTE — ED Notes (Signed)
Pt is finally sleeping as he has not slept since around this time yesterday. Will administer po meds when wakens.

## 2019-12-25 NOTE — ED Notes (Signed)
Breakfast ordered 

## 2019-12-25 NOTE — ED Notes (Signed)
Pt now awake - Signed Consent to Release Information to his mother Nicki [place] - Copy faxed to [facility] - Copy sent to Medical Records - Original placed on clipboard. Pt eating dinner.

## 2019-12-25 NOTE — Progress Notes (Signed)
Pt denied placement at [facility] due to not meeting their inpatient criteria.   Ruthann Cancer MSW, LCSWA Clincal Social Worker Disposition  [facility] Ph: [phone] Fax: [phone]

## 2019-12-25 NOTE — ED Notes (Addendum)
Pt gave verbal permission to give his mother and father any information. Pt to sign Consent to Release Information form after he finishes talking w/his mother on phone.

## 2019-12-25 NOTE — Consult Note (Signed)
Telepsych Consultation   Reason for Consult:  IVC'd  Referring Physician:  EPD  Location of Patient: 938H Location of Provider: [facility]  Patient Identification: Micheal Webb MRN:  [id number] Principal Diagnosis: <principal problem not specified> Diagnosis:  Active Problems:   * No active hospital problems. *   Total Time spent with patient: 15 minutes  Subjective:   Micheal Webb is a 44 y.o. male patient was seen and evaluated via teleassessment.  He is awake, alert and oriented x3.  Patient was asked the reason for admission?. "  I do know my mom and dad wanted me to be seen. "  Denying suicidal or homicidal ideations.  Denies auditory or visual hallucinations.  Reports he is followed by psychiatrist Sherlene Shams for medication management.  Reports he resides by himself denies hallucinations and/or paranoia at this time.  Denies plan or intent to hurt himself or anybody else during this assessment.  Chart review patient has diagnosis of schizophrenia and the new diagnosis of multiple sclerosis.  Patient on high-dose steroids.  Was reported patient was pacing the unit last night.  However, Micheal Webb  mood and affect appears to be improving.  Patient provided verbal authorization to follow-up with his mother Micheal Webb at [phone].  NP spoke to mother Micheal Webb regarding additional follow-up outpatient resources.  Does not appear to meet inpatient criteria during this assessment.  Mother has concerns for patient safety and new MS diagnoses.  Discussed consideration with power of attorney for medical decisions.  We will follow-up with social work for additional outpatient resources.  Case staffed with attending psychiatrist Cobos, reported patient psychiatric symptoms appear to be related neurologically. Patient to be cleared by psychiatry, may reconsult as needed.  Support, encouragement and reassurance was provided  HPI: Per admission assessment note- Micheal Webb is a  44 y.o. male who was brought to [facility] via the [facility] PD under IVC paperwork that was completed by pt's mother. A copy of pt's IVC is not available for review at the time of this [facility] Assessment. Pt's First Impression for IVC states: "Pt w/ hx of schizophrenia and new MS [diagnosis]. Patient w/ hallucinations and unable to contract for safety. Difficulty caring for himself due to mental health issues." When asked as to why pt was brought to the hospital, pt states, "I've been going through some problems and me and mom been going through some problems with the family." Clinician inquired about pt leaving the hospital AMA two days ago, and he states, "I was being bullish to get away because I didn't want to hear [the hospital's] BS." When clinician expressed not knowing what he was talking about, pt states he ran from the hospital because he didn't believe what he was being told and just wanted to return to his own apartment. He states he ran through traffic to get away and that he wasn't attempting to harm himself. Clinician questioned this further and pt re-iterated that, no, he wasn't attempting to harm himself--he was simply trying to get away from the hospital and run back to his apartment  Past Psychiatric History:   Risk to Self: Suicidal Ideation: No Suicidal Intent: No Is patient at risk for suicide?: No Suicidal Plan?: No Access to Means: No What has been your use of drugs/alcohol within the last 12 months?: Pt denies SA How many times?: (Pt unsure, states this was when he was an adolescent) Other Self Harm Risks: Pt has not been taking his medication, left AMA 2  days ago after MS dx Triggers for Past Attempts: Unknown Intentional Self Injurious Behavior: None Risk to Others: Homicidal Ideation: No Thoughts of Harm to Others: No Current Homicidal Intent: No Current Homicidal Plan: No Access to Homicidal Means: No Identified Victim: None noted History of harm to others?: No Assessment of  Violence: None Noted Violent Behavior Description: None noted Does patient have access to weapons?: No Criminal Charges Pending?: No Does patient have a court date: No Prior Inpatient Therapy: Prior Inpatient Therapy: Yes Prior Therapy Dates: When pt was an adolescent Prior Therapy Facilty/Provider(s): [facility] Reason for Treatment: SI Prior Outpatient Therapy: Prior Outpatient Therapy: No Does patient have an ACCT team?: No Does patient have Intensive In-House Services?  : No Does patient have [facility] services? : No Does patient have P4CC services?: No  Past Medical History:  Past Medical History:  Diagnosis Date  . Carpal tunnel syndrome on left   . MS (multiple sclerosis) (HCC)   . Schizophrenia (HCC)   . Seizure [facility])     Past Surgical History:  Procedure Laterality Date  . None     Family History:  Family History  Problem Relation Age of Onset  . Breast cancer Mother   . Diverticulitis Mother        had surgery and colostomy bag 11/2015  . Colon cancer Paternal Grandfather   . Pancreatic cancer Maternal Grandmother    Family Psychiatric  History:  Social History:  Social History   Substance and Sexual Activity  Alcohol Use Not Currently   Comment: occasion     Social History   Substance and Sexual Activity  Drug Use No   Comment: Denies    Social History   Socioeconomic History  . Marital status: Single    Spouse name: Not on file  . Number of children: Not on file  . Years of education: Not on file  . Highest education level: Not on file  Occupational History  . Not on file  Tobacco Use  . Smoking status: Current Every Day Smoker    Packs/day: 1.00    Types: Cigarettes  . Smokeless tobacco: Never Used  Substance and Sexual Activity  . Alcohol use: Not Currently    Comment: occasion  . Drug use: No    Comment: Denies  . Sexual activity: Not on file  Other Topics Concern  . Not on file  Social History Narrative   Patient lives  alone.    Patient is on disability.   Patient is single.       Social Determinants of Health   Financial Resource Strain:   . Difficulty of Paying Living Expenses:   Food Insecurity:   . Worried About Programme researcher, broadcasting/film/video in the Last Year:   . Barista in the Last Year:   Transportation Needs:   . Freight forwarder (Medical):   [name] Lack of Transportation (Non-Medical):   Physical Activity:   . Days of Exercise per Week:   . Minutes of Exercise per Session:   Stress:   . Feeling of Stress :   Social Connections:   . Frequency of Communication with Friends and Family:   . Frequency of Social Gatherings with Friends and Family:   . Attends Religious Services:   . Active Member of Clubs or Organizations:   . Attends Banker Meetings:   [name] Marital Status:    Additional Social History:    Allergies:  No Known Allergies  Labs:  Results for orders placed or performed during the hospital encounter of 12/22/19 (from the past 48 hour(s))  Rapid urine drug screen (hospital performed)     Status: Abnormal   Collection Time: 12/24/19  9:48 AM  Result Value Ref Range   Opiates NONE DETECTED NONE DETECTED   Cocaine NONE DETECTED NONE DETECTED   Benzodiazepines POSITIVE (A) NONE DETECTED   Amphetamines NONE DETECTED NONE DETECTED   Tetrahydrocannabinol POSITIVE (A) NONE DETECTED   Barbiturates NONE DETECTED NONE DETECTED    Comment: (NOTE) DRUG SCREEN FOR MEDICAL PURPOSES ONLY.  IF CONFIRMATION IS NEEDED FOR ANY PURPOSE, NOTIFY LAB WITHIN 5 DAYS. LOWEST DETECTABLE LIMITS FOR URINE DRUG SCREEN Drug Class                     Cutoff (ng/mL) Amphetamine and metabolites    1000 Barbiturate and metabolites    200 Benzodiazepine                 200 Tricyclics and metabolites     300 Opiates and metabolites        300 Cocaine and metabolites        300 THC                            50 Performed at [facility], [address], [place],  [place] [address]     Medications:  Current Facility-Administered Medications  Medication Dose Route Frequency Provider Last Rate Last Admin  . benztropine (COGENTIN) tablet 1 mg  1 mg Oral Daily Ronnie Doss A, PA-C   1 mg at 12/25/19 1327   And  . benztropine (COGENTIN) tablet 2 mg  2 mg Oral QHS Ronnie Doss A, PA-C   2 mg at 12/24/19 2133  . cholecalciferol (VITAMIN D3) tablet 125 mcg  125 mcg Oral Daily Ronnie Doss A, PA-C   125 mcg at 12/25/19 1326  . clonazePAM (KLONOPIN) tablet 1 mg  1 mg Oral Daily Ronnie Doss A, PA-C   1 mg at 12/25/19 1326  . divalproex (DEPAKOTE ER) 24 hr tablet 1,500 mg  1,500 mg Oral QHS Ronnie Doss A, PA-C   1,500 mg at 12/24/19 2133  . lamoTRIgine (LAMICTAL) tablet 50 mg  50 mg Oral BID Ronnie Doss A, PA-C   50 mg at 12/25/19 1327  . methylPREDNISolone sodium succinate (SOLU-MEDROL) 1,000 mg in sodium chloride 0.9 % 50 mL IVPB  1,000 mg Intravenous Daily Caryl Pina, MD   Stopped at 12/25/19 1116  . nicotine (NICODERM CQ - dosed in mg/24 hours) patch 14 mg  14 mg Transdermal Once Terald Sleeper, MD   14 mg at 12/24/19 1948  . pravastatin (PRAVACHOL) tablet 10 mg  10 mg Oral Daily Ronnie Doss A, PA-C   10 mg at 12/25/19 1326  . risperiDONE (RISPERDAL) tablet 2 mg  2 mg Oral BID Arlyn Dunning, PA-C   2 mg at 12/25/19 1327   Current Outpatient Medications  Medication Sig Dispense Refill  . benztropine (COGENTIN) 1 MG tablet Take 1 mg by mouth See admin instructions. 1 tablet in the morning and 2 tablets at bedtime.    . Cholecalciferol (VITAMIN D) 125 MCG (5000 UT) CAPS Take 125 mcg by mouth daily.     . clonazePAM (KLONOPIN) 1 MG tablet Take 1 mg by mouth in the morning and at bedtime.     . divalproex (DEPAKOTE ER) 500 MG 24 hr  tablet 1,500 mg at bedtime.   5  . lamoTRIgine (LAMICTAL) 25 MG tablet Take 2 tablets (50 mg total) by mouth 2 (two) times daily. 360 tablet 3  . meloxicam (MOBIC) 15 MG tablet Take 1 tablet (15 mg total) by mouth daily. 30  tablet 5  . pravastatin (PRAVACHOL) 10 MG tablet TAKE 1 TABLET BY MOUTH EVERY DAY (Patient taking differently: Take 10 mg by mouth daily. ) 90 tablet 0  . risperiDONE (RISPERDAL) 2 MG tablet Take 2 mg by mouth 2 (two) times daily.     [name] albuterol (PROVENTIL HFA;VENTOLIN HFA) 108 (90 Base) MCG/ACT inhaler Inhale 2 puffs into the lungs as needed for wheezing or shortness of breath. (Patient not taking: Reported on 12/22/2019) 1 Inhaler 6  . budesonide-formoterol (SYMBICORT) 160-4.5 MCG/ACT inhaler Inhale 2 puffs into the lungs daily. (Patient not taking: Reported on 12/22/2019) 1 Inhaler 6    Musculoskeletal:   Psychiatric Specialty Exam: Physical Exam  Constitutional: He is oriented to person, place, and time. He appears well-developed.  Neurological: He is alert and oriented to person, place, and time.  Psychiatric: He has a normal mood and affect. His behavior is normal.    Review of Systems  Psychiatric/Behavioral: Negative for suicidal ideas. Confusion: mild. The patient is nervous/anxious.   All other systems reviewed and are negative.   Blood pressure 115/72, pulse 78, temperature 98 F (36.7 C), temperature source Oral, resp. rate 16, SpO2 97 %.There is no height or weight on file to calculate BMI.  General Appearance: Casual  Eye Contact:  Fair  Speech:  Clear and Coherent can be difficult to understand, however will repeat for clarity   Volume:  Normal  Mood:  Anxious and Depressed  Affect:  Appropriate  Thought Process:  Coherent  Orientation:  Full (Time, Place, and Person)  Thought Content:  Logical  Suicidal Thoughts:  No  Homicidal Thoughts:  No  Memory:  Immediate;   Fair Recent;   Fair  Judgement:  Fair  Insight:  Fair  Psychomotor Activity:  Normal  Concentration:  Concentration: Fair  Recall:  [employer] of Knowledge:  Fair  Language:  Fair  Akathisia:  No  Handed:   AIMS (if indicated):     Assets:  Communication Skills Desire for  Improvement Resilience Social Support  ADL's:  Intact  Cognition:  WNL  Sleep:        Treatment Plan Summary: Daily contact with patient to assess and evaluate symptoms and progress in treatment and Medication management   - Continue medication for mood stabilization - Patient to be cleared by Psychiatry patient symptoms appear to be related to new multiple sclerosis diagnoses. - CSW to follow-up with additional outpatient resources  Disposition: No evidence of imminent risk to self or others at present.   Patient does not meet criteria for psychiatric inpatient admission. Supportive therapy provided about ongoing stressors. Refer to IOP. Discussed crisis plan, support from social network, calling 911, coming to the Emergency Department, and calling Suicide Hotline.  This service was provided via telemedicine using a 2-way, interactive audio and video technology.  Names of all persons participating in this telemedicine service and their role in this encounter. Name: Micheal Webb Role: patient   Name: T.Travers Goodley Role: NP           Oneta Rack, NP 12/25/2019 3:14 PM

## 2019-12-25 NOTE — ED Notes (Signed)
Pt awake now and is eating lunch. Pt aware his mother called and advised he may call her back if he is willing to be calm and cooperative.

## 2019-12-25 NOTE — ED Notes (Signed)
Sitter arrived to bedside. No Sitter available for pt from 1500 until now Metallurgist aware. Dinner tray delivered - Pt noted to be sleeping.

## 2019-12-25 NOTE — Progress Notes (Signed)
NP and Attending Psychiatrist attempted to reassess patient for daily assessment and medication management,  however it was reported that patient is resting at this time. Per   treatment team patient has been up all night pacing the floor and slightly irriatble. RN to call back when patient is awake.

## 2019-12-25 NOTE — ED Notes (Signed)
Telepsych being performed. 

## 2019-12-26 NOTE — ED Notes (Signed)
Lunch Tray Ordered @ 1737. 

## 2019-12-26 NOTE — ED Notes (Signed)
Breakfast ordered 

## 2019-12-26 NOTE — ED Notes (Signed)
Spoke to pt's mother Elita Quick, she would like a call from a doctor before pt is released tomorrow. Pt's mother has a few questions.

## 2019-12-26 NOTE — Social Work (Addendum)
CSW spoke with mother Nicki Guadalajara @ [phone]. Ms. Throckmorton states that Pt has own apartment  [address] [place], [place] Ms. Throckmorton has keys to apt. Ms. Rogelia Mire will meet Pt at apt when discharged. Please call Ms. Throckmorton when Pt is getting ready for discharge.   Per mom Pt sees [facility] for services and is seen at [facility].

## 2019-12-26 NOTE — ED Notes (Signed)
Patient has been having a full conversation with himself in for about 10 minutes; Pt continues to have active AH and VH at this time but does not attempt to get out of bed; sitter at [facility]

## 2019-12-26 NOTE — ED Notes (Signed)
Lunch Tray Ordered @ 1111. 

## 2019-12-26 NOTE — ED Notes (Signed)
SW at bedside.

## 2019-12-26 NOTE — ED Notes (Signed)
Pt slept through the night with minimal sleep disturbances.

## 2019-12-26 NOTE — Progress Notes (Signed)
Physical Therapy Treatment Patient Details Name: Micheal Webb MRN: [id number] DOB: [birth year] Today's Date: 12/26/2019    History of Present Illness Pt is a 44 y/o male presenting to the ED under IVC secondary to behavioral issues at home. Of note, pt with recent new diagnosis of MS on 12/20/19 but left AMA that hospital admission. This admission pt has been started on IV methylprednisolone. PMH including but not limited to schizophrenia, carpal tunnel syndrome and seizures.    PT Comments    Pt progressing towards goals. Mild instability during gait this session. Pt with notable weakness with standing during phone call during session and required seated rest. Feel current recommendations appropriate and will require 24/7 assist at d/c given current cognitive and physical deficits. Will continue to follow acutely to maximize functional mobility independence and safety.     Follow Up Recommendations  Home health PT;Supervision/Assistance - 24 hour;Other (comment)(would benefit from group home setting)     Equipment Recommendations  None recommended by PT    Recommendations for Other Services       Precautions / Restrictions Precautions Precautions: Fall Restrictions Weight Bearing Restrictions: No    Mobility  Bed Mobility Overal bed mobility: Modified Independent                Transfers Overall transfer level: Needs assistance Equipment used: None Transfers: Sit to/from Stand Sit to Stand: Min guard         General transfer comment: Min guard for safety.   Ambulation/Gait Ambulation/Gait assistance: Min guard Gait Distance (Feet): 100 Feet Assistive device: None Gait Pattern/deviations: Step-through pattern;Decreased stride length;Drifts right/left;Staggering right;Staggering left Gait velocity: Decreased   General Gait Details: Pt with mild unsteadiness. Walked to the phone in the ED, so he could make a phone call. Notable fatigue and weakness with  prolonged standing and required seated rest to finish call. Min guard for steadying throughout.    Stairs             Wheelchair Mobility    Modified Rankin (Stroke Patients Only)       Balance Overall balance assessment: Needs assistance Sitting-balance support: Feet supported Sitting balance-Leahy Scale: Good     Standing balance support: During functional activity Standing balance-Leahy Scale: Fair Standing balance comment: static is fair, dynamic is poor.                             Cognition Arousal/Alertness: Awake/alert Behavior During Therapy: Restless;Anxious Overall Cognitive Status: Impaired/Different from baseline Area of Impairment: Attention;Following commands;Safety/judgement;Problem solving;Awareness                   Current Attention Level: Selective   Following Commands: Follows one step commands consistently;Follows multi-step commands inconsistently;Follows multi-step commands with increased time Safety/Judgement: Decreased awareness of deficits;Decreased awareness of safety Awareness: Emergent Problem Solving: Requires verbal cues        Exercises      General Comments        Pertinent Vitals/Pain Pain Assessment: No/denies pain    Home Living                      Prior Function            PT Goals (current goals can now be found in the care plan section) Acute Rehab PT Goals Patient Stated Goal: to go home PT Goal Formulation: With patient Time For Goal Achievement: 01/07/20 Potential to Achieve Goals:  Good Progress towards PT goals: Progressing toward goals    Frequency    Min 3X/week      PT Plan Current plan remains appropriate    Co-evaluation              AM-PAC PT "6 Clicks" Mobility   Outcome Measure  Help needed turning from your back to your side while in a flat bed without using bedrails?: None Help needed moving from lying on your back to sitting on the side of a  flat bed without using bedrails?: None Help needed moving to and from a bed to a chair (including a wheelchair)?: A Little Help needed standing up from a chair using your arms (e.g., wheelchair or bedside chair)?: A Little Help needed to walk in hospital room?: A Little Help needed climbing 3-5 steps with a railing? : A Lot 6 Click Score: 19    End of Session Equipment Utilized During Treatment: Gait belt Activity Tolerance: Patient tolerated treatment well Patient left: in bed;with call bell/phone within reach Nurse Communication: Mobility status PT Visit Diagnosis: Other abnormalities of gait and mobility (R26.89);Muscle weakness (generalized) (M62.81)     Time: 4235-3614 PT Time Calculation (min) (ACUTE ONLY): 16 min  Charges:  $Gait Training: 8-22 mins                     Cindee Salt, DPT  Acute Rehabilitation Services  Pager: [phone] Office: [phone]    Lehman Prom 12/26/2019, 3:14 PM

## 2019-12-26 NOTE — ED Notes (Signed)
Pt resting comfortably in the bed with no complaints. Pt's dinner tray just arrived. Pt setting up to eat dinner.

## 2019-12-26 NOTE — Social Work (Signed)
CSW met with Pt at bedside. Pt was talking to himself and asked CSW if she was going to "shine the light in his face again"?  CSW assured Pt that she could speak to him with the light off. CSW and Pt spoke of possible discharge and of Pt returning to his apartment. Pt reports that apartment is noisy, but that it's not that bad. Pt states that he has many chores to do upon return home such as cleaning. Pt's speech is rapid and pressured, but his affect is pleasant.

## 2019-12-26 NOTE — ED Notes (Signed)
Pt walking with PT, made phonecall to mother during walk

## 2019-12-27 NOTE — ED Notes (Addendum)
PTAR is present to pick up pt - ALL belongings - 1 labeled belongings bag - returned to pt - Sitter assisting pt w/getting dressed into blue paper scrubs. PTAR aware mother is going to meet them at pt's apartment. Pt unable to sign.

## 2019-12-27 NOTE — Discharge Planning (Signed)
RNCM called referral for home health PT to  [facility] and [facility].  [facility] declines referral stating "no staff availability in that area at this time."  Will update team with results from [facility].

## 2019-12-27 NOTE — Discharge Planning (Signed)
[facility] declines pt at this time.

## 2019-12-27 NOTE — ED Notes (Signed)
PTAR notified of need for transport. Left message for pt's mother Elita Quick.

## 2019-12-27 NOTE — ED Notes (Signed)
IVC papers rescinded - Copy faxed to Clerk of Court - Copy sent to Medical Records - Original placed in folder for Clerk of Court.  

## 2019-12-27 NOTE — ED Notes (Signed)
Mother aware PTAR is en route to pick up - advised she will meet them at pt's apartment as she has the key. Also requested for PT to be set up Outpt d/t pt's apartment is small. Advised her Chili, CM, set up PT for Oupt.

## 2019-12-27 NOTE — Discharge Instructions (Addendum)
It was our pleasure to provide your ER care today - we hope that you feel better.  For behavioral health issues/condition, follow up with your behavioral health provider in the coming week - call them today to arrange follow up. If you have a mental health issues and/or crisis, you may go directly to [facility] or [facility].   For MS/weakness, follow up with neurology in the coming  week - call office today to arrange follow up appointment.  Also follow up closely with your primary care doctor in 1 week for  recheck - bring all of your medications to that visit.  We also have made a home health referral and they should be contacting you in the next 1-2 days.   Return to ER if worse, new symptoms, fevers, trouble breathing, new numbness/weakness, or other concern.

## 2019-12-27 NOTE — Discharge Planning (Signed)
RNCM consulted regarding home health service set-up.  RNCM will call known HH agencies in network with pt insurance.

## 2019-12-27 NOTE — ED Provider Notes (Signed)
Patient resting. Easily aroused. No new c/o this AM. Patient has been clear by psych team for d/c. Pt feels ready to go home today. Plan is for d/c post today's steroid infusion. SW has contact pts mother who had indicated she can meet him at his apt at d/c. Will also make home health referral for PT, OT, RN, aide, and SW.   Rec close outpt pcp, bh, and neurology f/u.  Return precautions provided.         Cathren Laine, MD 12/27/19 [phone]

## 2020-01-04 ENCOUNTER — Encounter: Payer: Self-pay | Admitting: Neurology

## 2020-01-04 ENCOUNTER — Ambulatory Visit: Payer: Medicaid Other | Admitting: Neurology

## 2020-01-04 ENCOUNTER — Telehealth: Payer: Self-pay | Admitting: *Deleted

## 2020-01-04 ENCOUNTER — Other Ambulatory Visit: Payer: Self-pay

## 2020-01-04 VITALS — BP 109/71 | HR 92 | Temp 97.1°F | Ht 64.0 in | Wt 140.0 lb

## 2020-01-04 DIAGNOSIS — G35 Multiple sclerosis: Secondary | ICD-10-CM

## 2020-01-04 DIAGNOSIS — F209 Schizophrenia, unspecified: Secondary | ICD-10-CM | POA: Diagnosis not present

## 2020-01-04 DIAGNOSIS — G40309 Generalized idiopathic epilepsy and epileptic syndromes, not intractable, without status epilepticus: Secondary | ICD-10-CM

## 2020-01-04 DIAGNOSIS — R4781 Slurred speech: Secondary | ICD-10-CM | POA: Diagnosis not present

## 2020-01-04 DIAGNOSIS — M542 Cervicalgia: Secondary | ICD-10-CM

## 2020-01-04 DIAGNOSIS — R2689 Other abnormalities of gait and mobility: Secondary | ICD-10-CM

## 2020-01-04 DIAGNOSIS — H539 Unspecified visual disturbance: Secondary | ICD-10-CM

## 2020-01-04 DIAGNOSIS — Z79899 Other long term (current) drug therapy: Secondary | ICD-10-CM

## 2020-01-04 DIAGNOSIS — G35D Multiple sclerosis, unspecified: Secondary | ICD-10-CM

## 2020-01-04 HISTORY — DX: Slurred speech: R47.81

## 2020-01-04 NOTE — Telephone Encounter (Signed)
Faxed completed/signed [employer] start form to [employer] at [phone]. Received fax confirmation.  

## 2020-01-04 NOTE — Progress Notes (Signed)
[facility]  PATIENT: Micheal Webb DOB: Apr 12, 1976  REFERRING DOCTOR OR PCP:  [name], FNP SOURCE: patient, notes from ED, lab and imaging reports, images personally reviewed.   _________________________________   HISTORICAL  CHIEF COMPLAINT:  Chief Complaint  Patient presents with  . New Patient (Initial Visit)    [name]3. Internal referral for MS from AL,NP.     HISTORY OF PRESENT ILLNESS:  Micheal Webb is a 44 year old man with schizophrenia and seizures who has just been diagnosed with relapsing remitting multiple sclerosis.  He presented to the [facility] emergency department 12/19/2019. At that time he had slurred speech, gait ataxia and lethargy. He also felt more confused.  An MRI of the brain was performed showing the multiple lesions including some that enhanced, in a pattern and distribution consistent with multiple sclerosis. We do not have an older MRI for comparison but a CT scan done before the MRI shows foci that were not present on a CT scan done several years ago. He was admitted to the hospital but left AMA the next day. He did receive at least a couple doses of IV Solu-Medrol and he felt his symptoms improved.    Currently, he feels his gait is just slightly off balanced, especially if walking faster.   He has one fall shortly after his discharge.   He is living by himself.    His mom notes his voice is still a little off since the exacerbation.   He denies weakness or numbness in his arms or legs.    He notes no change in bladder function   Colors are slightly altered OD but he feels acuity is about the same.   He does not recall other episodes of neurologic symptoms over the past few years.  His mother notes his memory is mildly worse.  He feels processing is slower.   He is sleeping well most nights but neighbors are noisy.    He is always fatigue and notes no change.   He sees [name] with [facility].   I  personally reviewed the MRI of the brain from 12/20/2019. It shows multiple T2/FLAIR hyperintense foci in the periventricular, juxtacortical and deep white matter of the hemispheres and a few foci in the brainstem. Some periventricular foci are radially oriented to the ventricles. About 8 of the foci in the hemispheres enhance after contrast. The enhancement is when shaped in general.  He had previously been seen in our office for generalized tonic-clonic seizures. He is on Depakote 1500 g nightly, lamotrigine 50 mg twice a day and clonazepam 1 mg for the seizures.   He has not had any seizures since the lamotrigine was added 2 years ago.   Additionally, he is on risperidone and Cogentin for his psychiatric issues.  Recent Depakote and lamotrigine levels were normal.  REVIEW OF SYSTEMS: Constitutional: No fevers, chills, sweats, or change in appetite.  Sleeps well but irregular hours Eyes: No visual changes, double vision, eye pain Ear, nose and throat: No hearing loss, ear pain, nasal congestion, sore throat Cardiovascular: No chest pain, palpitations Respiratory: No shortness of breath at rest or with exertion.   No wheezes GastrointestinaI: No nausea, vomiting, diarrhea, abdominal pain, fecal incontinence Genitourinary: No dysuria, urinary retention or frequency.  No nocturia. Musculoskeletal: notes neck pain and sometimes arm pain Integumentary: No rash, pruritus, skin lesions Neurological: as above Psychiatric: see above Endocrine: No palpitations, diaphoresis, change in appetite, change in weigh or  increased thirst Hematologic/Lymphatic: No anemia, purpura, petechiae. Allergic/Immunologic: No itchy/runny eyes, nasal congestion, recent allergic reactions, rashes  ALLERGIES: No Known Allergies  HOME MEDICATIONS:  Current Outpatient Medications:  .  albuterol (PROVENTIL HFA;VENTOLIN HFA) 108 (90 Base) MCG/ACT inhaler, Inhale 2 puffs into the lungs as needed for wheezing or shortness  of breath. (Patient not taking: Reported on 12/22/2019), Disp: 1 Inhaler, Rfl: 6 .  benztropine (COGENTIN) 1 MG tablet, Take 1 mg by mouth See admin instructions. 1 tablet in the morning and 2 tablets at bedtime., Disp: , Rfl:  .  budesonide-formoterol (SYMBICORT) 160-4.5 MCG/ACT inhaler, Inhale 2 puffs into the lungs daily. (Patient not taking: Reported on 12/22/2019), Disp: 1 Inhaler, Rfl: 6 .  Cholecalciferol (VITAMIN D) 125 MCG (5000 UT) CAPS, Take 125 mcg by mouth daily. , Disp: , Rfl:  .  clonazePAM (KLONOPIN) 1 MG tablet, Take 1 mg by mouth in the morning and at bedtime. , Disp: , Rfl:  .  divalproex (DEPAKOTE ER) 500 MG 24 hr tablet, 1,500 mg at bedtime. , Disp: , Rfl: 5 .  lamoTRIgine (LAMICTAL) 25 MG tablet, Take 2 tablets (50 mg total) by mouth 2 (two) times daily., Disp: 360 tablet, Rfl: 3 .  meloxicam (MOBIC) 15 MG tablet, Take 1 tablet (15 mg total) by mouth daily., Disp: 30 tablet, Rfl: 5 .  pravastatin (PRAVACHOL) 10 MG tablet, TAKE 1 TABLET BY MOUTH EVERY DAY (Patient taking differently: Take 10 mg by mouth daily. ), Disp: 90 tablet, Rfl: 0 .  risperiDONE (RISPERDAL) 2 MG tablet, Take 2 mg by mouth 2 (two) times daily. , Disp: , Rfl:   PAST MEDICAL HISTORY: Past Medical History:  Diagnosis Date  . Carpal tunnel syndrome on left   . MS (multiple sclerosis) (HCC)   . Schizophrenia (HCC)   . Seizure (HCC)     PAST SURGICAL HISTORY: Past Surgical History:  Procedure Laterality Date  . None      FAMILY HISTORY: Family History  Problem Relation Age of Onset  . Breast cancer Mother   . Diverticulitis Mother        had surgery and colostomy bag 11/2015  . Colon cancer Paternal Grandfather   . Pancreatic cancer Maternal Grandmother     SOCIAL HISTORY:  Social History   Socioeconomic History  . Marital status: Single    Spouse name: Not on file  . Number of children: Not on file  . Years of education: Not on file  . Highest education level: Not on file  Occupational  History  . Not on file  Tobacco Use  . Smoking status: Current Every Day Smoker    Packs/day: 1.00    Types: Cigarettes  . Smokeless tobacco: Never Used  Substance and Sexual Activity  . Alcohol use: Not Currently    Comment: occasion  . Drug use: No    Comment: Denies  . Sexual activity: Not on file  Other Topics Concern  . Not on file  Social History Narrative   Patient lives alone.    Patient is on disability.   Patient is single.       Social Determinants of Health   Financial Resource Strain:   . Difficulty of Paying Living Expenses:   Food Insecurity:   . Worried About Programme researcher, broadcasting/film/video in the Last Year:   . Barista in the Last Year:   Transportation Needs:   . Freight forwarder (Medical):   [name] Lack of Transportation (Non-Medical):  Physical Activity:   . Days of Exercise per Week:   . Minutes of Exercise per Session:   Stress:   . Feeling of Stress :   Social Connections:   . Frequency of Communication with Friends and Family:   . Frequency of Social Gatherings with Friends and Family:   . Attends Religious Services:   . Active Member of Clubs or Organizations:   . Attends Banker Meetings:   [name] Marital Status:   Intimate Partner Violence:   . Fear of Current or Ex-Partner:   . Emotionally Abused:   [name] Physically Abused:   . Sexually Abused:      PHYSICAL EXAM  There were no vitals filed for this visit.  There is no height or weight on file to calculate BMI.   Hearing Screening   125Hz  250Hz  500Hz  1000Hz  2000Hz  3000Hz  4000Hz  6000Hz  8000Hz   Right ear:           Left ear:             Visual Acuity Screening   Right eye Left eye Both eyes  Without correction: 20/70 20/40   With correction:       General: The patient is well-developed and well-nourished and in no acute distress  HEENT:  Head is [place]/AT.  Sclera are anicteric.  Funduscopic exam shows normal optic discs and retinal vessels.  Neck: No carotid bruits are  noted.  The neck is nontender.  Cardiovascular: The heart has a regular rate and rhythm with a normal S1 and S2. There were no murmurs, gallops or rubs.    Skin: Extremities are without rash or  edema.  Musculoskeletal:  Back is nontender  Neurologic Exam  Mental status: The patient is alert and oriented x 3 at the time of the examination. The patient has apparent normal recent and remote memory, with an apparently normal attention span and concentration ability.   Speech is normal.  Cranial nerves: Extraocular movements are full. Pupils are equal, round, and reactive to light and accomodation.  Reduced VA OD.  Facial symmetry is present. There is good facial sensation to soft touch bilaterally.Facial strength is normal.  Trapezius and sternocleidomastoid strength is normal. No dysarthria is noted.  The tongue is midline, and the patient has symmetric elevation of the soft palate. No obvious hearing deficits are noted.  Motor:  Muscle bulk is normal.   Tone is normal. Strength is  5 / 5 in all 4 extremities.   Sensory: Sensory testing is intact to pinprick, soft touch and vibration sensation in all 4 extremities.  Coordination: Cerebellar testing reveals good finger-nose-finger and heel-to-shin bilaterally.  Gait and station: Station is normal.   Gait is near normal but tandem gait is wide. Romberg is negative.   Reflexes: Deep tendon reflexes are symmetric and normal bilaterally.   Plantar responses are flexor.     DIAGNOSTIC DATA (LABS, IMAGING, TESTING) - I reviewed patient records, labs, notes, testing and imaging myself where available.  Lab Results  Component Value Date   WBC 9.8 12/22/2019   HGB 14.0 12/22/2019   HCT 40.1 12/22/2019   MCV 94.1 12/22/2019   PLT 167 12/22/2019      Component Value Date/Time   NA 138 12/22/2019 1239   NA 136 12/06/2019 1549   K 4.8 12/22/2019 1239   CL 103 12/22/2019 1239   CO2 26 12/22/2019 1239   GLUCOSE 167 (H) 12/22/2019 1239    BUN 15 12/22/2019 1239   BUN  4 (L) 12/06/2019 1549   CREATININE 0.89 12/22/2019 1239   CALCIUM 9.0 12/22/2019 1239   PROT 6.1 (L) 12/22/2019 1239   PROT 6.9 12/06/2019 1549   ALBUMIN 3.6 12/22/2019 1239   ALBUMIN 4.5 12/06/2019 1549   AST 55 (H) 12/22/2019 1239   ALT 37 12/22/2019 1239   ALKPHOS 49 12/22/2019 1239   BILITOT 1.8 (H) 12/22/2019 1239   BILITOT 0.5 12/06/2019 1549   GFRNONAA >60 12/22/2019 1239   GFRAA >60 12/22/2019 1239   Lab Results  Component Value Date   CHOL 138 12/06/2019   HDL 40 12/06/2019   LDLCALC 85 12/06/2019   TRIG 60 12/06/2019   CHOLHDL 3.5 12/06/2019       ASSESSMENT AND PLAN  Multiple sclerosis (HCC) - Plan: MR CERVICAL SPINE W WO CONTRAST, MR THORACIC SPINE W WO CONTRAST  Schizophrenia, unspecified type ([place])  Generalized convulsive epilepsy ([place])  Slurred speech  Imbalance  Neck pain   In summary, Micheal Webb is a 44 year old man with a history of schizophrenia and generalized tonic-clonic seizures who had the onset of new neurologic symptoms including slurred speech and ataxia couple weeks ago and was found to have an MRI of the brain very consistent with multiple sclerosis.  Although he does not recall an episode consistent with optic neuritis, he does have significant asymmetry of visual acuity and some changes in color vision and likely had this in the past.  His speech and ataxia symptoms have since improved and he is closer to baseline.  Therefore, he probably would not get much benefit from PT at this time.  We spent some time discussing MS in general and disease modifying therapies in more detail.  He would prefer not to do an IV therapy.  He appears to be presenting with an aggressiveness of it a bit above average.  I will start him on either Tecfidera or Vumerity.  If he has trouble tolerating it or we have breakthrough disease I would consider Mayzent, Zeposia, Tysabri or Ocrevus.  I have recommended that he try to get his  vaccination for COVID-19 over the next couple months.  We will also check an MRI of the cervical and thoracic spine to determine if he has any lesions that might better explain his reduced gait.  Seizures seem well controlled on the current regimen and no changes will be made at this time.  He will return to see me in 3 to 4 months or sooner for new or worsening neurologic symptoms.  I will check a CBC with differential at that time to determine if there is any lymphopenia.  Thank you for asking me to see Mr. Schappell.  Please let me know if I can be of further assistance with him or other patients in the future.  75-minute office visit with the majority of the time spent face-to-face for history and physical, discussion/counseling and decision-making.  Additional time with record review and documentation.   Nahsir Venezia A. Felecia Shelling, MD, [facility] 3/64/6803, 2:12 AM Certified in Neurology, Clinical Neurophysiology, Sleep Medicine and Neuroimaging  [facility] [address] [place], [place] [address] [phone]

## 2020-01-04 NOTE — Telephone Encounter (Signed)
Placed [facility] lab in [facility] lock box for routine lab pick up. Results pending. 

## 2020-01-05 ENCOUNTER — Telehealth: Payer: Self-pay | Admitting: Neurology

## 2020-01-05 LAB — CBC WITH DIFFERENTIAL/PLATELET
Basophils Absolute: 0.1 10*3/uL (ref 0.0–0.2)
Basos: 0 %
EOS (ABSOLUTE): 0.2 10*3/uL (ref 0.0–0.4)
Eos: 2 %
Hematocrit: 43.3 % (ref 37.5–51.0)
Hemoglobin: 14.9 g/dL (ref 13.0–17.7)
Immature Grans (Abs): 0.2 10*3/uL — ABNORMAL HIGH (ref 0.0–0.1)
Immature Granulocytes: 1 %
Lymphocytes Absolute: 2.3 10*3/uL (ref 0.7–3.1)
Lymphs: 14 %
MCH: 32.5 pg (ref 26.6–33.0)
MCHC: 34.4 g/dL (ref 31.5–35.7)
MCV: 95 fL (ref 79–97)
Monocytes Absolute: 1.4 10*3/uL — ABNORMAL HIGH (ref 0.1–0.9)
Monocytes: 9 %
Neutrophils Absolute: 12.2 10*3/uL — ABNORMAL HIGH (ref 1.4–7.0)
Neutrophils: 74 %
Platelets: 193 10*3/uL (ref 150–450)
RBC: 4.58 x10E6/uL (ref 4.14–5.80)
RDW: 13.1 % (ref 11.6–15.4)
WBC: 16.3 10*3/uL — ABNORMAL HIGH (ref 3.4–10.8)

## 2020-01-05 LAB — VARICELLA ZOSTER ANTIBODY, IGG: Varicella zoster IgG: 630 index (ref >165)

## 2020-01-05 NOTE — Telephone Encounter (Signed)
medicaid order sent to GI. They will obtain the auth and reach out to the patient to schedule.  °

## 2020-01-05 NOTE — Telephone Encounter (Signed)
Took call from phone staff and spoke with Tera Mater with [facility]. They received start form with prescription for Tecfidera. They need VO to confirm. Provided VO: Titration: 120mg  po BID x7 days #14 capsules     240mg  po BIDx23 days #46 capsules  Maintenance: 240mg  po BID #180, 3 refills  Advised Tecfidera brand on medicaid formulary, should be run as brand.  Nothing further needed.

## 2020-01-12 NOTE — Telephone Encounter (Signed)
Called and spoke with mother, Micheal Webb (on [place]). States she has received calls from [employer] and [facility] but they will not talk with her unless her son is with her. She is planning on seeing him tomorrow and will call both of them back. She will call me back if she runs into any issues. She is aware we are closed tomorrow but will be back on Monday. She is going to make shipping address her own. She is also going to try and get POA set up.

## 2020-01-16 NOTE — Telephone Encounter (Signed)
Received fax from [employer] that [employer] confirmed that Micheal Webb has been shipped. Phone: [phone]. Fax: [phone].

## 2020-01-16 NOTE — Telephone Encounter (Signed)
[facility] ab collected on 01/04/20 negative, index: 0.19.

## 2020-01-23 NOTE — Telephone Encounter (Signed)
Pt mother called back in regards to missed call from RN

## 2020-01-23 NOTE — Telephone Encounter (Signed)
I called pt. No answer, left a message asking him to call us back. I was calling to find out if he received tecfidera and started it yet.

## 2020-01-23 NOTE — Telephone Encounter (Signed)
Pt's mother returned the call and left a VM with the office  I called pt's mother again, no answer, left a VM asking her to call us back.

## 2020-01-23 NOTE — Telephone Encounter (Signed)
I called pt's mother, Elita Quick, per DPR. Pt started Tecfidera on Friday and pt has not reported any issues with the Tecfidera to his mother. Pt's mother will call us back if pt informs her of any questions or concerns regarding tecfidera.

## 2020-02-02 ENCOUNTER — Ambulatory Visit
Admission: RE | Admit: 2020-02-02 | Discharge: 2020-02-02 | Disposition: A | Discharge status: Home / Self Care | Payer: Medicaid Other | Source: Ambulatory Visit | Attending: Neurology | Admitting: Neurology

## 2020-02-02 ENCOUNTER — Other Ambulatory Visit: Payer: Self-pay | Admitting: Nurse Practitioner

## 2020-02-02 ENCOUNTER — Other Ambulatory Visit: Payer: Self-pay

## 2020-02-02 DIAGNOSIS — G35 Multiple sclerosis: Secondary | ICD-10-CM

## 2020-02-02 MED ORDER — GADOBENATE DIMEGLUMINE 529 MG/ML IV SOLN
13.0000 mL | Freq: Once | INTRAVENOUS | Status: AC | PRN
  Administered 2020-02-02: 13 mL via INTRAVENOUS

## 2020-02-02 MED ORDER — PRAVASTATIN SODIUM 10 MG PO TABS: 10.0000 mg | ORAL_TABLET | Freq: Every day | ORAL | 0 refills | Status: DC

## 2020-02-07 ENCOUNTER — Telehealth: Payer: Self-pay | Admitting: *Deleted

## 2020-02-07 NOTE — Telephone Encounter (Signed)
-----   Message from Asa Lente, MD sent at 02/04/2020  5:50 PM EDT ----- Please let him know that the MRI of the spine showed some degenerative changes especially at C5-C6 and C6-C7 where he had mild spinal stenosis.[name]  However, there was no evidence of MS within the spinal cord which is good

## 2020-02-07 NOTE — Telephone Encounter (Signed)
Called, LVM for mother about results. Advised she did not have to call back unless she had further questions.

## 2020-02-14 ENCOUNTER — Telehealth: Payer: Self-pay | Admitting: Neurology

## 2020-02-14 NOTE — Telephone Encounter (Signed)
Pt's mom called back and is requesting an appointment with Dr. Epimenio Foot, Amy or Aundra Millet due to this recent seizure event.  I did check Dr. Bonnita Hollow, Amy or Laurissa Cowper's schedule.  The NP's right now have not openings ( a few slots are on hold for other pts).  I did advise the pt maybe we could do a work in visit on Monday? But would check with the nurse.

## 2020-02-14 NOTE — Telephone Encounter (Signed)
Dr. Sater- please advise 

## 2020-02-14 NOTE — Telephone Encounter (Signed)
Called mother back to further discuss. Ambulance there to evaluate him now. She states he uses [facility] and they saw he missed some doses of seizure medication. Advised this is most likely what led to seizure. She is going to review medications and make sure pt gets back on his regimen. She will call if he has any more seizures.

## 2020-02-14 NOTE — Telephone Encounter (Signed)
Throckmorton,[name](mother on DPR) just called to report that pt just had a seizure.  Mother states pt seems to be okay but she would very much like a call from RN to discuss the seizure.

## 2020-02-15 NOTE — Telephone Encounter (Signed)
Ok, if additional seizures occur we can increase the lamotrigine

## 2020-02-15 NOTE — Telephone Encounter (Signed)
I'd like to increase the lamotrigine to 100 mg po bid  (stop and call us if rash develops)

## 2020-02-15 NOTE — Telephone Encounter (Signed)
Called mother back and relayed Dr. Bonnita Hollow recommendation. She states they found out he missed 2 pills this past Sun, Mon, Tues of the lamotrigine and feels this is the cause of having a seizure. Would like to hold off on adjusting dose. Psychiatrist had him switch to pill packs this past week and she is going to change him back to pull trays that she helps set up. He did better with this. She will call back if he has any more seizures.

## 2020-04-24 ENCOUNTER — Other Ambulatory Visit: Payer: Self-pay | Admitting: Nurse Practitioner

## 2020-04-24 DIAGNOSIS — M79602 Pain in left arm: Secondary | ICD-10-CM

## 2020-04-24 NOTE — Telephone Encounter (Signed)
Refill request Meloxicam  °

## 2020-05-08 ENCOUNTER — Other Ambulatory Visit: Payer: Self-pay

## 2020-05-08 ENCOUNTER — Encounter: Payer: Self-pay | Admitting: Neurology

## 2020-05-08 ENCOUNTER — Ambulatory Visit: Payer: Medicaid Other | Admitting: Neurology

## 2020-05-08 VITALS — BP 122/70 | HR 88 | Ht 64.0 in | Wt 139.0 lb

## 2020-05-08 DIAGNOSIS — G40309 Generalized idiopathic epilepsy and epileptic syndromes, not intractable, without status epilepticus: Secondary | ICD-10-CM

## 2020-05-08 DIAGNOSIS — R2689 Other abnormalities of gait and mobility: Secondary | ICD-10-CM

## 2020-05-08 DIAGNOSIS — Z79899 Other long term (current) drug therapy: Secondary | ICD-10-CM | POA: Diagnosis not present

## 2020-05-08 DIAGNOSIS — G35 Multiple sclerosis: Secondary | ICD-10-CM

## 2020-05-08 NOTE — Progress Notes (Signed)
[facility]  PATIENT: Micheal Webb DOB: [birth year]  REFERRING DOCTOR OR PCP:  [name], FNP SOURCE: patient, notes from ED, lab and imaging reports, images personally reviewed.   _________________________________   HISTORICAL  CHIEF COMPLAINT:  Chief Complaint  Patient presents with  . Follow-up    [name]3 with Beth. Last seen 01/04/20. wanting to know if they can cx upcoming appt with AL,NP since he is being seen today.  . Multiple Sclerosis    On Tecfidera, tolerating well.  . 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   He tolerates it well.  He has not had any new issues of slurred speech, numbness, weakness or other neurologic symptoms.   Gait is fine and balance is just mildly off.  He is still having seizures (GTC followed by confusion) about once a month.   Of note, he missed some dose and one seizure occurred during that time.    He is on Depakote 1500 mg nightly, lamotrigine 50 mg po bid and clonazepam 1 mg po bid.    He has schizophrenia and sees [name] ([facility]).    He has not yet had the Covid-19 vaccination.    MS History: He presented to the [facility] emergency department 12/19/2019 age 57. At that time he had slurred speech, gait ataxia and lethargy. He also felt more confused.  An MRI of the brain was performed showing the multiple lesions including some that enhanced, in a pattern and distribution consistent with multiple sclerosis. We do not have an older MRI for comparison but a CT scan done before the MRI shows foci that were not present on a CT scan done several years ago. He was admitted to the hospital but left AMA the next day. He did receive at least a couple doses of IV Solu-Medrol and he felt his symptoms  improved.    Currently, he feels his gait is just slightly off balanced, especially if walking faster.   He has one fall shortly after his discharge.   He is living by himself.    His mom notes his voice is still a little off since the exacerbation.   He denies weakness or numbness in his arms or legs.    He notes no change in bladder function   Colors are slightly altered OD but he feels acuity is about the same.   He does not recall other episodes of neurologic symptoms over the past few years.  His mother notes his memory is mildly worse.  He feels processing is slower.   He is sleeping well most nights but neighbors are noisy.    He is always fatigue and notes no change.   He sees [name] with [facility] and counseling.   MRI of the brain 4/13/2021shows multiple T2/FLAIR hyperintense foci in the periventricular, juxtacortical and deep white matter of the hemispheres and a few foci in the brainstem. Some periventricular foci are radially oriented to the ventricles. About 8 of the foci in the hemispheres enhance after contrast. The enhancement is when shaped in general.  He had previously been seen in our office for generalized tonic-clonic seizures. He is on Depakote 1500 g nightly, lamotrigine 50 mg twice a day and clonazepam 1 mg for  the seizures.   He has not had any seizures since the lamotrigine was added 2 years ago.   Additionally, he is on risperidone and Cogentin for his psychiatric issues.  Recent Depakote and lamotrigine levels were normal.  REVIEW OF SYSTEMS: Constitutional: No fevers, chills, sweats, or change in appetite.  Sleeps well but irregular hours Eyes: No visual changes, double vision, eye pain Ear, nose and throat: No hearing loss, ear pain, nasal congestion, sore throat Cardiovascular: No chest pain, palpitations Respiratory: No shortness of breath at rest or with exertion.   No wheezes GastrointestinaI: No nausea, vomiting, diarrhea, abdominal pain, fecal  incontinence Genitourinary: No dysuria, urinary retention or frequency.  No nocturia. Musculoskeletal: notes neck pain and sometimes arm pain Integumentary: No rash, pruritus, skin lesions Neurological: as above Psychiatric: see above Endocrine: No palpitations, diaphoresis, change in appetite, change in weigh or increased thirst Hematologic/Lymphatic: No anemia, purpura, petechiae. Allergic/Immunologic: No itchy/runny eyes, nasal congestion, recent allergic reactions, rashes  ALLERGIES: No Known Allergies  HOME MEDICATIONS:  Current Outpatient Medications:  .  albuterol (PROVENTIL HFA;VENTOLIN HFA) 108 (90 Base) MCG/ACT inhaler, Inhale 2 puffs into the lungs as needed for wheezing or shortness of breath., Disp: 1 Inhaler, Rfl: 6 .  benztropine (COGENTIN) 1 MG tablet, Take 1 mg by mouth See admin instructions. 1 tablet in the morning and 2 tablets at bedtime., Disp: , Rfl:  .  budesonide-formoterol (SYMBICORT) 160-4.5 MCG/ACT inhaler, Inhale 2 puffs into the lungs daily., Disp: 1 Inhaler, Rfl: 6 .  Cholecalciferol (VITAMIN D) 125 MCG (5000 UT) CAPS, Take 125 mcg by mouth daily. , Disp: , Rfl:  .  clonazePAM (KLONOPIN) 1 MG tablet, Take 1 mg by mouth in the morning and at bedtime. , Disp: , Rfl:  .  Dimethyl Fumarate (TECFIDERA) 120 & 240 MG MISC, Take by mouth., Disp: , Rfl:  .  divalproex (DEPAKOTE ER) 500 MG 24 hr tablet, 1,500 mg at bedtime. , Disp: , Rfl: 5 .  lamoTRIgine (LAMICTAL) 25 MG tablet, Take 2 tablets (50 mg total) by mouth 2 (two) times daily., Disp: 360 tablet, Rfl: 3 .  meloxicam (MOBIC) 15 MG tablet, TAKE ONE TABLET BY MOUTH DAILY, Disp: 30 tablet, Rfl: 1 .  pravastatin (PRAVACHOL) 10 MG tablet, TAKE ONE TABLET BY MOUTH AT BEDTIME, Disp: 90 tablet, Rfl: 1 .  risperiDONE (RISPERDAL) 2 MG tablet, Take 2 mg by mouth 2 (two) times daily. , Disp: , Rfl:   PAST MEDICAL HISTORY: Past Medical History:  Diagnosis Date  . Carpal tunnel syndrome on left   . MS (multiple  sclerosis) (HCC)   . Schizophrenia (HCC)   . Seizure (HCC)     PAST SURGICAL HISTORY: Past Surgical History:  Procedure Laterality Date  . None      FAMILY HISTORY: Family History  Problem Relation Age of Onset  . Breast cancer Mother   . Diverticulitis Mother        had surgery and colostomy bag 11/2015  . Colon cancer Paternal Grandfather   . Pancreatic cancer Maternal Grandmother     SOCIAL HISTORY:  Social History   Socioeconomic History  . Marital status: Single    Spouse name: Not on file  . Number of children: 0  . Years of education: 8  . Highest education level: Not on file  Occupational History  . Occupation: Disabled   Tobacco Use  . Smoking status: Current Every Day Smoker    Packs/day: 1.00    Types: Cigarettes  . Smokeless  tobacco: Never Used  Substance and Sexual Activity  . Alcohol use: Yes    Comment: sometimes   . Drug use: No    Comment: Denies  . Sexual activity: Not on file  Other Topics Concern  . Not on file  Social History Narrative   Patient lives alone.    Patient is on disability.   Patient is single.    Caffeine use: coffee    Social Determinants of Health   Financial Resource Strain:   . Difficulty of Paying Living Expenses: Not on file  Food Insecurity:   . Worried About Programme researcher, broadcasting/film/video in the Last Year: Not on file  . Ran Out of Food in the Last Year: Not on file  Transportation Needs:   . Lack of Transportation (Medical): Not on file  . Lack of Transportation (Non-Medical): Not on file  Physical Activity:   . Days of Exercise per Week: Not on file  . Minutes of Exercise per Session: Not on file  Stress:   . Feeling of Stress : Not on file  Social Connections:   . Frequency of Communication with Friends and Family: Not on file  . Frequency of Social Gatherings with Friends and Family: Not on file  . Attends Religious Services: Not on file  . Active Member of Clubs or Organizations: Not on file  . Attends Tax inspector Meetings: Not on file  . Marital Status: Not on file  Intimate Partner Violence:   . Fear of Current or Ex-Partner: Not on file  . Emotionally Abused: Not on file  . Physically Abused: Not on file  . Sexually Abused: Not on file     PHYSICAL EXAM  Vitals:   05/08/20 1439  BP: 122/70  Pulse: 88  Weight: 139 lb (63 kg)  Height: 5\' 4"  (1.626 m)    Body mass index is 23.86 kg/m.  No exam data present  General: The patient is well-developed and well-nourished and in no acute distress.  Neck with good ROM   Skin: Extremities are without rash or  edema.   Neurologic Exam  Mental status: The patient is alert and oriented x 3 at the time of the examination. The patient has apparent normal recent and remote memory, with an apparently normal attention span and concentration ability.   Speech is normal.  Cranial nerves: Extraocular movements are full. Pupils are equal, round, and reactive to light and accomodation.  Mildly reduced color vision OD.  Facial symmetry is present. There is good facial sensation to soft touch bilaterally.Facial strength is normal.  Trapezius and sternocleidomastoid strength is normal. No dysarthria is noted.  No obvious hearing deficits are noted.  Motor:  Muscle bulk is normal.   Tone is normal. Strength is  5 / 5 in all 4 extremities.   Sensory: Sensory testing is intact to pinprick, soft touch and vibration sensation in all 4 extremities.  Coordination: Cerebellar testing reveals good finger-nose-finger and heel-to-shin bilaterally.  Gait and station: Station is normal.   Gait is normal but tandem gait is wide. Romberg is negative.   Reflexes: Deep tendon reflexes are symmetric and normal bilaterally.         DIAGNOSTIC DATA (LABS, IMAGING, TESTING) - I reviewed patient records, labs, notes, testing and imaging myself where available.  Lab Results  Component Value Date   WBC 16.3 (H) 01/04/2020   HGB 14.9 01/04/2020   HCT 43.3  01/04/2020   MCV 95 01/04/2020   PLT  193 01/04/2020      Component Value Date/Time   NA 138 12/22/2019 1239   NA 136 12/06/2019 1549   K 4.8 12/22/2019 1239   CL 103 12/22/2019 1239   CO2 26 12/22/2019 1239   GLUCOSE 167 (H) 12/22/2019 1239   BUN 15 12/22/2019 1239   BUN 4 (L) 12/06/2019 1549   CREATININE 0.89 12/22/2019 1239   CALCIUM 9.0 12/22/2019 1239   PROT 6.1 (L) 12/22/2019 1239   PROT 6.9 12/06/2019 1549   ALBUMIN 3.6 12/22/2019 1239   ALBUMIN 4.5 12/06/2019 1549   AST 55 (H) 12/22/2019 1239   ALT 37 12/22/2019 1239   ALKPHOS 49 12/22/2019 1239   BILITOT 1.8 (H) 12/22/2019 1239   BILITOT 0.5 12/06/2019 1549   GFRNONAA >60 12/22/2019 1239   GFRAA >60 12/22/2019 1239   Lab Results  Component Value Date   CHOL 138 12/06/2019   HDL 40 12/06/2019   LDLCALC 85 12/06/2019   TRIG 60 12/06/2019   CHOLHDL 3.5 12/06/2019       ASSESSMENT AND PLAN  Multiple sclerosis (HCC) - Plan: CBC with Differential/Platelet, Comprehensive metabolic panel, MR BRAIN W WO CONTRAST  Generalized convulsive epilepsy (HCC) - Plan: CBC with Differential/Platelet, Comprehensive metabolic panel  High risk medication use - Plan: CBC with Differential/Platelet, Comprehensive metabolic panel  Imbalance  1.  Continue Tecfidera.   Check labs today 2.  Continue Depakote and lamotrigine.  Check VPA level 3.   We discussed getting the Covcid 19 vaccination 4.   Stay active and exercise as tolerated. 5.   rtc 6 months or sooner if new or worsening neurologic issues.   Hiro Vipond A. Epimenio Foot, MD, [facility] 05/08/2020, 3:39 PM Certified in Neurology, Clinical Neurophysiology, Sleep Medicine and Neuroimaging  [facility] [address], [address] [place], [place] [address] [phone]

## 2020-05-09 ENCOUNTER — Telehealth: Payer: Self-pay | Admitting: Neurology

## 2020-05-09 LAB — COMPREHENSIVE METABOLIC PANEL
ALT: 19 IU/L (ref 0–44)
AST: 15 IU/L (ref 0–40)
Albumin/Globulin Ratio: 1.8 (ref 1.2–2.2)
Albumin: 4.8 g/dL (ref 4.0–5.0)
Alkaline Phosphatase: 83 IU/L (ref 48–121)
BUN/Creatinine Ratio: 8 — ABNORMAL LOW (ref 9–20)
BUN: 6 mg/dL (ref 6–24)
Bilirubin Total: 0.3 mg/dL (ref 0.0–1.2)
CO2: 24 mmol/L (ref 20–29)
Calcium: 10.1 mg/dL (ref 8.7–10.2)
Chloride: 100 mmol/L (ref 96–106)
Creatinine, Ser: 0.78 mg/dL (ref 0.76–1.27)
GFR calc Af Amer: 125 mL/min/{1.73_m2} (ref >59)
GFR calc non Af Amer: 108 mL/min/{1.73_m2} (ref >59)
Globulin, Total: 2.6 g/dL (ref 1.5–4.5)
Glucose: 81 mg/dL (ref 65–99)
Potassium: 4.9 mmol/L (ref 3.5–5.2)
Sodium: 141 mmol/L (ref 134–144)
Total Protein: 7.4 g/dL (ref 6.0–8.5)

## 2020-05-09 LAB — CBC WITH DIFFERENTIAL/PLATELET
Basophils Absolute: 0.1 10*3/uL (ref 0.0–0.2)
Basos: 1 %
EOS (ABSOLUTE): 0 10*3/uL (ref 0.0–0.4)
Eos: 0 %
Hematocrit: 44.2 % (ref 37.5–51.0)
Hemoglobin: 15.5 g/dL (ref 13.0–17.7)
Immature Grans (Abs): 0.1 10*3/uL (ref 0.0–0.1)
Immature Granulocytes: 1 %
Lymphocytes Absolute: 2 10*3/uL (ref 0.7–3.1)
Lymphs: 22 %
MCH: 31.7 pg (ref 26.6–33.0)
MCHC: 35.1 g/dL (ref 31.5–35.7)
MCV: 90 fL (ref 79–97)
Monocytes Absolute: 0.8 10*3/uL (ref 0.1–0.9)
Monocytes: 9 %
Neutrophils Absolute: 6.2 10*3/uL (ref 1.4–7.0)
Neutrophils: 67 %
Platelets: 195 10*3/uL (ref 150–450)
RBC: 4.89 x10E6/uL (ref 4.14–5.80)
RDW: 12.8 % (ref 11.6–15.4)
WBC: 9.2 10*3/uL (ref 3.4–10.8)

## 2020-05-09 NOTE — Telephone Encounter (Signed)
Medicaid order sent to [facility]. They will reach out to the patient to schedule.

## 2020-05-29 ENCOUNTER — Ambulatory Visit: Payer: Medicaid Other | Admitting: Family Medicine

## 2020-06-08 ENCOUNTER — Other Ambulatory Visit: Payer: Self-pay

## 2020-06-08 ENCOUNTER — Ambulatory Visit
Admission: RE | Admit: 2020-06-08 | Discharge: 2020-06-08 | Disposition: A | Discharge status: Home / Self Care | Payer: Medicaid Other | Source: Ambulatory Visit | Attending: Neurology | Admitting: Neurology

## 2020-06-08 DIAGNOSIS — G35 Multiple sclerosis: Secondary | ICD-10-CM | POA: Diagnosis not present

## 2020-06-08 MED ORDER — GADOBENATE DIMEGLUMINE 529 MG/ML IV SOLN
16.0000 mL | Freq: Once | INTRAVENOUS | Status: AC | PRN
  Administered 2020-06-08: 16 mL via INTRAVENOUS

## 2020-06-11 ENCOUNTER — Other Ambulatory Visit: Payer: Self-pay

## 2020-06-11 ENCOUNTER — Ambulatory Visit (INDEPENDENT_AMBULATORY_CARE_PROVIDER_SITE_OTHER): Payer: Medicaid Other | Admitting: Nurse Practitioner

## 2020-06-11 ENCOUNTER — Telehealth: Payer: Self-pay | Admitting: *Deleted

## 2020-06-11 ENCOUNTER — Encounter: Payer: Self-pay | Admitting: Nurse Practitioner

## 2020-06-11 VITALS — BP 116/72 | HR 84 | Temp 98.5°F | Ht 64.0 in | Wt 142.0 lb

## 2020-06-11 DIAGNOSIS — F209 Schizophrenia, unspecified: Secondary | ICD-10-CM

## 2020-06-11 DIAGNOSIS — H6123 Impacted cerumen, bilateral: Secondary | ICD-10-CM | POA: Diagnosis not present

## 2020-06-11 DIAGNOSIS — Z0000 Encounter for general adult medical examination without abnormal findings: Secondary | ICD-10-CM

## 2020-06-11 DIAGNOSIS — E782 Mixed hyperlipidemia: Secondary | ICD-10-CM | POA: Diagnosis not present

## 2020-06-11 DIAGNOSIS — E559 Vitamin D deficiency, unspecified: Secondary | ICD-10-CM | POA: Diagnosis not present

## 2020-06-11 DIAGNOSIS — Z79899 Other long term (current) drug therapy: Secondary | ICD-10-CM

## 2020-06-11 DIAGNOSIS — Z1159 Encounter for screening for other viral diseases: Secondary | ICD-10-CM

## 2020-06-11 DIAGNOSIS — G35 Multiple sclerosis: Secondary | ICD-10-CM

## 2020-06-11 NOTE — Telephone Encounter (Signed)
-----   Message from Asa Lente, MD sent at 06/10/2020  2:04 PM EDT ----- Please let him know that the MRI of the brain looks good.  There has been only one new lesion compared to the MRI earlier this year.  The lesions that were new or on the last MRI look better.

## 2020-06-11 NOTE — Progress Notes (Signed)
I,Yamilka Roman [employer] as a Education administrator for [employer], FNP.,have documented all relevant documentation on the behalf of Minette Brine, FNP,as directed by  Minette Brine, FNP while in the presence of Minette Brine, [place]. This visit occurred during the SARS-CoV-2 public health emergency.  Safety protocols were in place, including screening questions prior to the visit, additional usage of staff PPE, and extensive cleaning of exam room while observing appropriate contact time as indicated for disinfecting solutions.  Subjective:     Patient ID: Micheal Webb , male    DOB: 10-Aug-1976 , 44 y.o.   MRN: [id number]   Chief Complaint  Patient presents with  . Annual Exam    HPI  Here for HM and follow up hyperlipidemia Patient refuses to undress for his physical   He has been newly diagnosed with MS.  He is taking Tecfidera. Being followed by Dr. Felecia Shelling.    He is currently taking 63m of clonazepam twice a day. He is currently receiving pill pack.   His mother is present during visit at this time he is going to wait on his flu vaccine    Past Medical History:  Diagnosis Date  . Carpal tunnel syndrome on left   . MS (multiple sclerosis) ([place]   . Schizophrenia ([place]   . Seizure ([facility]      Family History  Problem Relation Age of Onset  . Breast cancer Mother   . Diverticulitis Mother        had surgery and colostomy bag 11/2015  . Colon cancer Paternal Grandfather   . Pancreatic cancer Maternal Grandmother      Current Outpatient Medications:  .  albuterol (PROVENTIL HFA;VENTOLIN HFA) 108 (90 Base) MCG/ACT inhaler, Inhale 2 puffs into the lungs as needed for wheezing or shortness of breath., Disp: 1 Inhaler, Rfl: 6 .  benztropine (COGENTIN) 1 MG tablet, Take 1 mg by mouth See admin instructions. 1 tablet in the morning and 2 tablets at bedtime., Disp: , Rfl:  .  budesonide-formoterol (SYMBICORT) 160-4.5 MCG/ACT inhaler, Inhale 2 puffs into the lungs daily., Disp: 1 Inhaler, Rfl:  6 .  Cholecalciferol (VITAMIN D) 125 MCG (5000 UT) CAPS, Take 125 mcg by mouth daily. , Disp: , Rfl:  .  clonazePAM (KLONOPIN) 1 MG tablet, Take 1 mg by mouth in the morning and at bedtime. , Disp: , Rfl:  .  Dimethyl Fumarate (TECFIDERA) 120 & 240 MG MISC, Take by mouth., Disp: , Rfl:  .  divalproex (DEPAKOTE ER) 500 MG 24 hr tablet, 1,500 mg at bedtime. , Disp: , Rfl: 5 .  lamoTRIgine (LAMICTAL) 25 MG tablet, Take 2 tablets (50 mg total) by mouth 2 (two) times daily., Disp: 360 tablet, Rfl: 3 .  meloxicam (MOBIC) 15 MG tablet, TAKE ONE TABLET BY MOUTH DAILY, Disp: 30 tablet, Rfl: 1 .  pravastatin (PRAVACHOL) 10 MG tablet, TAKE ONE TABLET BY MOUTH AT BEDTIME, Disp: 90 tablet, Rfl: 1 .  risperiDONE (RISPERDAL) 2 MG tablet, Take 2 mg by mouth 2 (two) times daily. , Disp: , Rfl:    No Known Allergies   Men's preventive visit. Patient Health Questionnaire (PHQ-2) is    Office Visit from 12/06/2019 in [facility]  PHQ-2 Total Score 0    Patient is on a regular diet, . He is exercising more by riding his bike.  His mother has seen a change in his attitude since [place] Marital status: Single. Relevant history for alcohol use  is:  Social History   Substance and Sexual Activity  Alcohol Use Yes   Comment: sometimes     Relevant history for tobacco use is:  Social History   Tobacco Use  Smoking Status Current Every Day Smoker  . Packs/day: 1.00  . Types: Cigarettes  Smokeless Tobacco Never Used  .   Review of Systems  Constitutional: Negative.   HENT: Negative.   Eyes: Negative.   Respiratory: Negative.   Cardiovascular: Negative.   Gastrointestinal: Negative.   Endocrine: Negative.   Genitourinary: Negative.   Musculoskeletal: Negative.   Skin: Negative.   Neurological: Negative.   Hematological: Negative.   Psychiatric/Behavioral: Negative.      Today's Vitals   06/11/20 1433  BP: 116/72  Pulse: 84  Temp: 98.5 F  (36.9 C)  TempSrc: Oral  Weight: 142 lb (64.4 kg)  Height: _0  (1.626 m)   Body mass index is 24.37 kg/m.   Objective:  Physical Exam Vitals reviewed.  Constitutional:      Appearance: Normal appearance. He is obese.  HENT:     Head: Normocephalic and atraumatic.     Right Ear: External ear normal. There is impacted cerumen.     Left Ear: External ear normal. There is impacted cerumen.     Nose:     Comments: Deferred - masked    Mouth/Throat:     Comments: Deferred - masked Eyes:     Pupils: Pupils are equal, round, and reactive to light.  Cardiovascular:     Rate and Rhythm: Normal rate and regular rhythm.     Pulses: Normal pulses.     Heart sounds: Normal heart sounds. No murmur heard.   Pulmonary:     Effort: Pulmonary effort is normal. No respiratory distress.     Breath sounds: Normal breath sounds.  Abdominal:     General: Abdomen is flat. Bowel sounds are normal. There is no distension.     Palpations: Abdomen is soft.  Genitourinary:    Prostate: Normal.     Rectum: Guaiac result negative.  Musculoskeletal:        General: No swelling. Normal range of motion.     Cervical back: Normal range of motion and neck supple.  Skin:    General: Skin is warm.     Capillary Refill: Capillary refill takes less than 2 seconds.  Neurological:     General: No focal deficit present.     Mental Status: He is alert and oriented to person, place, and time.     Cranial Nerves: No cranial nerve deficit.  Psychiatric:        Mood and Affect: Mood normal.        Behavior: Behavior normal.        Thought Content: Thought content normal.        Judgment: Judgment normal.         Assessment And Plan:    1. Encounter for general adult medical examination w/o abnormal findings . Behavior modifications discussed and diet history reviewed.   . Pt will continue to exercise regularly and modify diet with low GI, plant based foods and decrease intake of processed foods.   . Recommend intake of daily multivitamin, Vitamin D, and calcium.  . Recommend for preventive screenings, as well as recommend immunizations that include influenza, TDAP (up to date) - CBC with Diff - CMP14+EGFR  2. Mixed hyperlipidemia  Chronic, tolerating statin well - Lipid panel  3. Vitamin D deficiency  Will check  vitamin D level and supplement as needed.     Also encouraged to spend 15 minutes in the sun daily.  - Vitamin D (25 hydroxy)  4. Schizophrenia, unspecified type ([place])  Continue follow up with Donata Clay, forwarded requested labs  - T3, free - T4 - TSH - Vitamin B12  5. Bilateral impacted cerumen  Water lavage done with good results  6. Encounter for hepatitis C screening test for low risk patient  Will check Hepatitis C screening due to recent recommendations to screen all adults 18 years and older - Hepatitis C antibody  7. Encounter for long-term (current) use of medications - T3, free - T4 - TSH - Vitamin B12 - Vitamin D (25 hydroxy)  8. Multiple sclerosis ([place])  He is fairly newly diagnosed and is currently taking Tecfidera  Continue follow up with Neurology    Patient was given opportunity to ask questions. Patient verbalized understanding of the plan and was able to repeat key elements of the plan. All questions were answered to their satisfaction.    Teola [place], FNP, have reviewed all documentation for this visit. The documentation on 06/12/20 for the exam, diagnosis, procedures, and orders are all accurate and complete.   THE PATIENT IS ENCOURAGED TO PRACTICE SOCIAL DISTANCING DUE TO THE COVID-19 PANDEMIC.

## 2020-06-11 NOTE — Telephone Encounter (Signed)
Called, LVM for [name](on DPR) about results per Dr. Epimenio Foot note. Instructed her to call back if she has any further questions/concerns.

## 2020-06-11 NOTE — Patient Instructions (Signed)

## 2020-06-12 LAB — CBC WITH DIFFERENTIAL/PLATELET
Basophils Absolute: 0.1 10*3/uL (ref 0.0–0.2)
Basos: 1 %
EOS (ABSOLUTE): 0.2 10*3/uL (ref 0.0–0.4)
Eos: 2 %
Hematocrit: 40.1 % (ref 37.5–51.0)
Hemoglobin: 14.1 g/dL (ref 13.0–17.7)
Immature Grans (Abs): 0.1 10*3/uL (ref 0.0–0.1)
Immature Granulocytes: 1 %
Lymphocytes Absolute: 2.5 10*3/uL (ref 0.7–3.1)
Lymphs: 27 %
MCH: 32.6 pg (ref 26.6–33.0)
MCHC: 35.2 g/dL (ref 31.5–35.7)
MCV: 93 fL (ref 79–97)
Monocytes Absolute: 0.7 10*3/uL (ref 0.1–0.9)
Monocytes: 7 %
Neutrophils Absolute: 6 10*3/uL (ref 1.4–7.0)
Neutrophils: 62 %
Platelets: 183 10*3/uL (ref 150–450)
RBC: 4.33 x10E6/uL (ref 4.14–5.80)
RDW: 13 % (ref 11.6–15.4)
WBC: 9.5 10*3/uL (ref 3.4–10.8)

## 2020-06-12 LAB — CMP14+EGFR
ALT: 21 IU/L (ref 0–44)
AST: 19 IU/L (ref 0–40)
Albumin/Globulin Ratio: 2.1 (ref 1.2–2.2)
Albumin: 4.5 g/dL (ref 4.0–5.0)
Alkaline Phosphatase: 72 IU/L (ref 44–121)
BUN/Creatinine Ratio: 11 (ref 9–20)
BUN: 8 mg/dL (ref 6–24)
Bilirubin Total: 0.4 mg/dL (ref 0.0–1.2)
CO2: 23 mmol/L (ref 20–29)
Calcium: 9.7 mg/dL (ref 8.7–10.2)
Chloride: 101 mmol/L (ref 96–106)
Creatinine, Ser: 0.73 mg/dL — ABNORMAL LOW (ref 0.76–1.27)
GFR calc Af Amer: 129 mL/min/{1.73_m2} (ref >59)
GFR calc non Af Amer: 111 mL/min/{1.73_m2} (ref >59)
Globulin, Total: 2.1 g/dL (ref 1.5–4.5)
Glucose: 84 mg/dL (ref 65–99)
Potassium: 4.6 mmol/L (ref 3.5–5.2)
Sodium: 138 mmol/L (ref 134–144)
Total Protein: 6.6 g/dL (ref 6.0–8.5)

## 2020-06-12 LAB — LIPID PANEL
Chol/HDL Ratio: 4.1 ratio (ref 0.0–5.0)
Cholesterol, Total: 159 mg/dL (ref 100–199)
HDL: 39 mg/dL — ABNORMAL LOW (ref >39)
LDL Chol Calc (NIH): 104 mg/dL — ABNORMAL HIGH (ref 0–99)
Triglycerides: 83 mg/dL (ref 0–149)
VLDL Cholesterol Cal: 16 mg/dL (ref 5–40)

## 2020-06-12 LAB — HEPATITIS C ANTIBODY: Hep C Virus Ab: <0.1 s/co ratio (ref 0.0–0.9)

## 2020-06-12 LAB — T3, FREE: T3, Free: 2.9 pg/mL (ref 2.0–4.4)

## 2020-06-12 LAB — TSH: TSH: 1.48 u[IU]/mL (ref 0.450–4.500)

## 2020-06-12 LAB — VITAMIN B12: Vitamin B-12: 633 pg/mL (ref 232–1245)

## 2020-06-12 LAB — VITAMIN D 25 HYDROXY (VIT D DEFICIENCY, FRACTURES): Vit D, 25-Hydroxy: 92.4 ng/mL (ref 30.0–100.0)

## 2020-06-12 LAB — T4: T4, Total: 8.7 ug/dL (ref 4.5–12.0)

## 2020-06-13 ENCOUNTER — Telehealth: Payer: Self-pay

## 2020-06-13 NOTE — Telephone Encounter (Signed)
Patient's mother notified of his lab results. YL,RMA

## 2020-06-19 ENCOUNTER — Other Ambulatory Visit: Payer: Self-pay | Admitting: Nurse Practitioner

## 2020-06-19 DIAGNOSIS — M79602 Pain in left arm: Secondary | ICD-10-CM

## 2020-07-05 ENCOUNTER — Telehealth: Payer: Self-pay | Admitting: Neurology

## 2020-07-05 ENCOUNTER — Other Ambulatory Visit: Payer: Self-pay | Admitting: Neurology

## 2020-07-05 MED ORDER — LAMOTRIGINE 25 MG PO TABS: 50.0000 mg | ORAL_TABLET | Freq: Two times a day (BID) | ORAL | 3 refills | Status: DC

## 2020-07-05 NOTE — Telephone Encounter (Signed)
E-scribed refill as requested. 

## 2020-07-05 NOTE — Telephone Encounter (Signed)
Pt is requesting a refill for lamoTRIgine (LAMICTAL) 25 MG tablet to  Pharmacy:  [facility] .

## 2020-07-05 NOTE — Addendum Note (Signed)
Addended by: Arther Abbott on: 07/05/2020 02:48 PM   Modules accepted: Orders

## 2020-07-23 ENCOUNTER — Other Ambulatory Visit: Payer: Self-pay | Admitting: Nurse Practitioner

## 2020-07-23 DIAGNOSIS — M79602 Pain in left arm: Secondary | ICD-10-CM

## 2020-07-23 NOTE — Telephone Encounter (Signed)
Meloxicam refill 

## 2020-08-20 ENCOUNTER — Other Ambulatory Visit: Payer: Self-pay | Admitting: Nurse Practitioner

## 2020-08-20 DIAGNOSIS — M79602 Pain in left arm: Secondary | ICD-10-CM

## 2020-08-20 NOTE — Telephone Encounter (Signed)
Refill

## 2020-09-25 ENCOUNTER — Other Ambulatory Visit: Payer: Self-pay | Admitting: Nurse Practitioner

## 2020-09-25 DIAGNOSIS — M79602 Pain in left arm: Secondary | ICD-10-CM

## 2020-10-23 ENCOUNTER — Other Ambulatory Visit: Payer: Self-pay | Admitting: Nurse Practitioner

## 2020-10-23 DIAGNOSIS — M79602 Pain in left arm: Secondary | ICD-10-CM

## 2020-11-06 ENCOUNTER — Ambulatory Visit: Payer: Medicaid Other | Admitting: Family Medicine

## 2020-11-06 ENCOUNTER — Encounter: Payer: Self-pay | Admitting: Family Medicine

## 2020-11-06 VITALS — BP 117/73 | HR 94 | Ht 68.0 in | Wt 152.0 lb

## 2020-11-06 DIAGNOSIS — G35 Multiple sclerosis: Secondary | ICD-10-CM | POA: Diagnosis not present

## 2020-11-06 DIAGNOSIS — Z79899 Other long term (current) drug therapy: Secondary | ICD-10-CM | POA: Diagnosis not present

## 2020-11-06 DIAGNOSIS — F209 Schizophrenia, unspecified: Secondary | ICD-10-CM

## 2020-11-06 DIAGNOSIS — G40309 Generalized idiopathic epilepsy and epileptic syndromes, not intractable, without status epilepticus: Secondary | ICD-10-CM | POA: Diagnosis not present

## 2020-11-06 MED ORDER — LAMOTRIGINE 25 MG PO TABS: 75.0000 mg | ORAL_TABLET | Freq: Two times a day (BID) | ORAL | 11 refills | Status: DC

## 2020-11-06 NOTE — Patient Instructions (Addendum)
Below is our plan:  We will continue Tecfidera. Continue divalproex as prescribed by Misty Stanley. We will increase lamotrigine to 75mg  twice daily. Please let me know if you have any worsening seizure or tremor. I will update labs today.   Please make sure you are staying well hydrated. I recommend 50-60 ounces daily. Well balanced diet and regular exercise encouraged. Consistent sleep schedule with 6-8 hours recommended.   Please continue follow up with care team as directed.   Follow up with Dr in 6 months   You may receive a survey regarding today's visit. I encourage you to leave honest feed back as I do use this information to improve patient care. Thank you for seeing me today!      Multiple Sclerosis Multiple sclerosis (MS) is a disease of the brain, spinal cord, and optic nerves (central nervous system). It causes the body's disease-fighting (immune) system to destroy the protective covering (myelin sheath) around nerves in the brain. When this happens, signals (nerve impulses) going to and from the brain and spinal cord do not get sent properly or may not get sent at all. There are several types of MS:  Relapsing-remitting MS. This is the most common type. This causes sudden attacks of symptoms. After an attack, you may recover completely until the next attack, or some symptoms may remain permanently.  Secondary progressive MS. This usually develops after the onset of relapsing-remitting MS. Similar to relapsing-remitting MS, this type also causes sudden attacks of symptoms. Attacks may be less frequent, but symptoms slowly get worse (progress) over time.  Primary progressive MS. This causes symptoms that steadily progress over time. This type of MS does not cause sudden attacks of symptoms. The age of onset of MS varies, but it often develops between 53-52 years of age. MS is a lifelong (chronic) condition. There is no cure, but treatment can help slow down the progression of the  disease. What are the causes? The cause of this condition is not known. What increases the risk? You are more likely to develop this condition if:  You are a woman.  You have a relative with MS. However, the condition is not passed from parent to child (inherited).  You have a lack (deficiency) of vitamin D.  You smoke. MS is more common in the 41 than in the [place]. What are the signs or symptoms? Relapsing-remitting and secondary progressive MS cause symptoms to occur in episodes or attacks that may last weeks to months. There may be long periods between attacks in which there are almost no symptoms. Primary progressive MS causes symptoms to steadily progress after they develop. Symptoms of MS vary because of the many different ways it affects the central nervous system. The main symptoms include:  Vision problems and eye pain.  Numbness and weakness.  Inability to move your arms, hands, feet, or legs (paralysis).  Balance problems.  Shaking that you cannot control (tremors).  Muscle spasms.  Problems with thinking (cognitive changes). MS can also cause symptoms that are associated with the disease, but are not always the direct result of an MS attack. They may include:  Inability to control urination or bowel movements (incontinence).  Headaches.  Fatigue.  Inability to tolerate heat.  Emotional changes.  Depression.  Pain. How is this diagnosed? This condition is diagnosed based on:  Your symptoms.  A neurological exam. This involves checking central nervous system function, such as nerve function, reflexes, and coordination.  MRIs of  the brain and spinal cord.  Lab tests, including a lumbar puncture that tests the fluid that surrounds the brain and spinal cord (cerebrospinal fluid).  Tests to measure the electrical activity of the brain in response to stimulation (evoked potentials). How is this treated? There is no  cure for MS, but medicines can help decrease the number and frequency of attacks and help relieve nuisance symptoms. Treatment options may include:  Medicines that reduce the frequency of attacks. These medicines may be given by injection, by mouth (orally), or through an IV.  Medicines that reduce inflammation (steroids). These may provide short-term relief of symptoms.  Medicines to help control pain, depression, fatigue, or incontinence.  Nutritional counseling. Vitamin D supplements, if you have a deficiency.  Using devices to help you move around (assistive devices), such as braces, a cane, or a walker.  Physical therapy to strengthen and stretch your muscles.  Occupational therapy to help you with everyday tasks.  Alternative or complementary treatments such as exercise, massage, or acupuncture.   Follow these instructions at home:  Take over-the-counter and prescription medicines only as told by your health care provider.  Do not drive or use heavy machinery while taking prescription pain medicine.  Use assistive devices as recommended by your physical therapist or your health care provider.  Exercise as directed by your health care provider.  Eating healthy can help manage MS symptoms.  Return to your normal activities as told by your health care provider. Ask your health care provider what activities are safe for you.  Reach out for support. Share your feelings with friends, family, or a support group.  Keep all follow-up visits as told by your health care provider and therapists. This is important. Where to find more information  National Multiple Sclerosis Society: https://www.nationalmssociety.org  [employer] of Neurological Disorders and Stroke: https://johnson-smith.net/  [employer] for Complementary and Integrative Health: http://miller-hamilton.net/ Contact a health care provider if:  You feel depressed.  You develop new pain or numbness.  You  have tremors.  You have problems with sexual function. Get help right away if:  You develop paralysis.  You develop numbness.  You have problems with your bladder or bowel function.  You develop double vision.  You lose vision in one or both eyes.  You develop suicidal thoughts.  You develop severe confusion. If you ever feel like you may hurt yourself or others, or have thoughts about taking your own life, get help right away. You can go to your nearest emergency department or call:  Your local emergency services (911 in the [place]  A suicide crisis helpline, such as the National Suicide Prevention Lifeline at (862) 679-4566. This is open 24 hours a day. Summary  Multiple sclerosis (MS) is a disease of the central nervous system that causes the body's immune system to destroy the protective covering (myelin sheath) around nerves in the brain.  There are 3 types of MS: relapsing-remitting, secondary progressive, and primary progressive. Relapsing-remitting and secondary progressive MS cause symptoms to occur in episodes or attacks that may last weeks to months. Primary progressive MS causes symptoms to steadily progress after they develop.  There is no cure for MS, but medicines can help decrease the number and frequency of attacks and help relieve nuisance symptoms. Treatment may also include physical or occupational therapy.  If you develop numbness, paralysis, vision problems, or other neurological symptoms, get help right away. This information is not intended to replace advice given to you by your health  care provider. Make sure you discuss any questions you have with your health care provider. Document Revised: 06/05/2020 Document Reviewed: 06/05/2020 Elsevier Patient Education  2021 [employer].

## 2020-11-06 NOTE — Progress Notes (Signed)
I have read the note, and I agree with the clinical assessment and plan.  Richard A. Sater, MD, PhD, FAAN Certified in Neurology, Clinical Neurophysiology, Sleep Medicine, Pain Medicine and Neuroimaging  [facility] [address], [address] [place], [place] [address] [phone]  

## 2020-11-06 NOTE — Progress Notes (Signed)
Chief Complaint  Patient presents with  . Follow-up    [name] with mother Micheal Webb) PT is      HISTORY OF PRESENT ILLNESS: 11/06/20 ALL:   Micheal Webb is a 45 y.o. male here today for follow up for RRMS and seizures. 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   He has had three seizures since being seen in 04/2020. Last seizure was last month. Events were not witnessed but Marshal reports LOC with collapse. He had an injury to his chin from the last seizure. He is confused and sore afterwards. He was previously doing very well with no seizure activity on divalproex ER 1500mg  daily and lamotrigine 50mg  BID.   He denies new or exacerbating symptoms. No changes in gait. He is sleeping well. He is seen regularly by psychiatry. He feels mood is good on clonazepam, divalproex and Risperdal.    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   He tolerates it well.  He has not had any new issues of slurred speech, numbness, weakness or other neurologic symptoms.   Gait is fine and balance is just mildly off.  He is still having seizures (GTC followed by confusion) about once a month.   Of note, he missed some dose and one seizure occurred during that time.    He is on Depakote 1500 mg nightly, lamotrigine 50 mg po bid and clonazepam 1 mg po bid.    He has schizophrenia and sees [name] ([facility]).    He has not yet had the Covid-19 vaccination.    MS History: He presented to the [facility] emergency department 12/19/2019 age 63. At that time he had slurred speech, gait ataxia and lethargy. He also felt more confused.  An MRI of the brain was performed showing the multiple lesions including some that enhanced, in a pattern and distribution consistent with multiple  sclerosis. We do not have an older MRI for comparison but a CT scan done before the MRI shows foci that were not present on a CT scan done several years ago. He was admitted to the hospital but left AMA the next day. He did receive at least a couple doses of IV Solu-Medrol and he felt his symptoms improved.    Currently, he feels his gait is just slightly off balanced, especially if walking faster.   He has one fall shortly after his discharge.   He is living by himself.    His mom notes his voice is still a little off since the exacerbation.   He denies weakness or numbness in his arms or legs.    He notes no change in bladder function   Colors are slightly altered OD but he feels acuity is about the same.   He does not recall other episodes of neurologic symptoms over the past few years.  His mother notes his memory is mildly worse.  He feels processing is slower.   He is sleeping well most nights but neighbors are noisy.    He is always fatigue and notes no change.   He sees [name] with [facility] and counseling.   MRI of the brain 4/13/2021shows multiple T2/FLAIR hyperintense foci in the periventricular, juxtacortical and deep white matter of the hemispheres and a few  foci in the brainstem. Some periventricular foci are radially oriented to the ventricles. About 8 of the foci in the hemispheres enhance after contrast. The enhancement is when shaped in general.  He had previously been seen in our office for generalized tonic-clonic seizures. He is on Depakote 1500 g nightly, lamotrigine 50 mg twice a day and clonazepam 1 mg for the seizures.   He has not had any seizures since the lamotrigine was added 2 years ago.   Additionally, he is on risperidone and Cogentin for his psychiatric issues.  Recent Depakote and lamotrigine levels were normal.     REVIEW OF SYSTEMS: Out of a complete 14 system review of symptoms, the patient complains only of the following symptoms, seizures and all  other reviewed systems are negative.    ALLERGIES: No Known Allergies   HOME MEDICATIONS: Outpatient Medications Prior to Visit  Medication Sig Dispense Refill  . benztropine (COGENTIN) 1 MG tablet Take 1 mg by mouth See admin instructions. 1 tablet in the morning and 2 tablets at bedtime.    . Cholecalciferol (VITAMIN D) 125 MCG (5000 UT) CAPS Take 125 mcg by mouth daily.     . clonazePAM (KLONOPIN) 1 MG tablet Take 1 mg by mouth in the morning and at bedtime.     . Dimethyl Fumarate (TECFIDERA) 120 & 240 MG MISC Take by mouth.    . divalproex (DEPAKOTE ER) 500 MG 24 hr tablet 1,500 mg at bedtime.   5  . meloxicam (MOBIC) 15 MG tablet TAKE ONE TABLET BY MOUTH DAILY 30 tablet 0  . pravastatin (PRAVACHOL) 10 MG tablet TAKE ONE TABLET BY MOUTH AT BEDTIME 90 tablet 4  . risperiDONE (RISPERDAL) 2 MG tablet Take 2 mg by mouth 2 (two) times daily.     [name] albuterol (PROVENTIL HFA;VENTOLIN HFA) 108 (90 Base) MCG/ACT inhaler Inhale 2 puffs into the lungs as needed for wheezing or shortness of breath. 1 Inhaler 6  . budesonide-formoterol (SYMBICORT) 160-4.5 MCG/ACT inhaler Inhale 2 puffs into the lungs daily. 1 Inhaler 6  . lamoTRIgine (LAMICTAL) 25 MG tablet Take 2 tablets (50 mg total) by mouth 2 (two) times daily. 360 tablet 3   No facility-administered medications prior to visit.     PAST MEDICAL HISTORY: Past Medical History:  Diagnosis Date  . Carpal tunnel syndrome on left   . MS (multiple sclerosis) (HCC)   . Schizophrenia (HCC)   . Seizure (HCC)      PAST SURGICAL HISTORY: Past Surgical History:  Procedure Laterality Date  . None       FAMILY HISTORY: Family History  Problem Relation Age of Onset  . Breast cancer Mother   . Diverticulitis Mother        had surgery and colostomy bag 11/2015  . Colon cancer Paternal Grandfather   . Pancreatic cancer Maternal Grandmother      SOCIAL HISTORY: Social History   Socioeconomic History  . Marital status: Single     Spouse name: Not on file  . Number of children: 0  . Years of education: 8  . Highest education level: Not on file  Occupational History  . Occupation: Disabled   Tobacco Use  . Smoking status: Current Every Day Smoker    Packs/day: 1.00    Types: Cigarettes  . Smokeless tobacco: Never Used  Substance and Sexual Activity  . Alcohol use: Yes    Comment: sometimes   . Drug use: No    Comment: Denies  . Sexual activity:  Not on file  Other Topics Concern  . Not on file  Social History Narrative   Patient lives alone.    Patient is on disability.   Patient is single.    Caffeine use: coffee    Social Determinants of Corporate investment banker Strain: Not on file  Food Insecurity: Not on file  Transportation Needs: Not on file  Physical Activity: Not on file  Stress: Not on file  Social Connections: Not on file  Intimate Partner Violence: Not on file      PHYSICAL EXAM  Vitals:   11/06/20 1515  BP: 117/73  Pulse: 94  Weight: 152 lb (68.9 kg)  Height: 5\' 8"  (1.727 m)   Body mass index is 23.11 kg/m.   Generalized: Well developed, in no acute distress  Cardiology: normal rate and rhythm, no murmur auscultated  Respiratory: clear to auscultation bilaterally    Neurological examination  Mentation: Alert oriented to time, place, history taking. Follows all commands speech and language fluent Cranial nerve II-XII: Pupils were equal round reactive to light. Extraocular movements were full, visual field were full on confrontational test. Facial sensation and strength were normal. Head turning and shoulder shrug  were normal and symmetric. Motor: The motor testing reveals 5 over 5 strength of all 4 extremities. Good symmetric motor tone is noted throughout.  Sensory: Sensory testing is intact to soft touch on all 4 extremities. No evidence of extinction is noted.  Coordination: Cerebellar testing reveals good finger-nose-finger and heel-to-shin bilaterally.  Gait and  station: Gait is normal.  Reflexes: Deep tendon reflexes are symmetric and normal bilaterally.     DIAGNOSTIC DATA (LABS, IMAGING, TESTING) - I reviewed patient records, labs, notes, testing and imaging myself where available.  Lab Results  Component Value Date   WBC 9.5 06/11/2020   HGB 14.1 06/11/2020   HCT 40.1 06/11/2020   MCV 93 06/11/2020   PLT 183 06/11/2020      Component Value Date/Time   NA 138 06/11/2020 1544   K 4.6 06/11/2020 1544   CL 101 06/11/2020 1544   CO2 23 06/11/2020 1544   GLUCOSE 84 06/11/2020 1544   GLUCOSE 167 (H) 12/22/2019 1239   BUN 8 06/11/2020 1544   CREATININE 0.73 (L) 06/11/2020 1544   CALCIUM 9.7 06/11/2020 1544   PROT 6.6 06/11/2020 1544   ALBUMIN 4.5 06/11/2020 1544   AST 19 06/11/2020 1544   ALT 21 06/11/2020 1544   ALKPHOS 72 06/11/2020 1544   BILITOT 0.4 06/11/2020 1544   GFRNONAA 111 06/11/2020 1544   GFRAA 129 06/11/2020 1544   Lab Results  Component Value Date   CHOL 159 06/11/2020   HDL 39 (L) 06/11/2020   LDLCALC 104 (H) 06/11/2020   TRIG 83 06/11/2020   CHOLHDL 4.1 06/11/2020   No results found for: HGBA1C Lab Results  Component Value Date   VITAMINB12 633 06/11/2020   Lab Results  Component Value Date   TSH 1.480 06/11/2020    No flowsheet data found.   No flowsheet data found.   ASSESSMENT AND PLAN  45 y.o. year old male  has a past medical history of Carpal tunnel syndrome on left, MS (multiple sclerosis) (HCC), Schizophrenia (HCC), and Seizure (HCC). here with   Multiple sclerosis (HCC) - Plan: CBC with Differential/Platelets, CMP  Generalized convulsive epilepsy (HCC) - Plan: Valproic Acid Level, Lamotrigine level  Schizophrenia, unspecified type (HCC)  High risk medication use - Plan: CBC with Differential/Platelets, CMP  Darrell is doing  well from an MS standpoint. We will continue Tecfidera. I will update labs today. I have discussed seizure management with Dr Epimenio Foot. We will increase  lamotrigine to 75mg  BID and he will continue divalproex ER 1500mg  as prescribed by psychiatry. He is aware to monitor for worsening tremor or mood. He was encouraged to stay well hydrated. Seizure precautions reviewed. He does not drive. Medications are put in pill container by mom. He will follow up with in 6 months.   Orders Placed This Encounter  Procedures  . CBC with Differential/Platelets  . CMP  . Valproic Acid Level  . Lamotrigine level     Meds ordered this encounter  Medications  . lamoTRIgine (LAMICTAL) 25 MG tablet    Sig: Take 3 tablets (75 mg total) by mouth 2 (two) times daily.    Dispense:  180 tablet    Refill:  11    May get 30 day supply at at time if pt wants.    Order Specific Question:   Supervising Provider    Answer:   [place]      I spent 30 minutes of face-to-face and non-face-to-face time with patient.  This included previsit chart review, lab review, study review, order entry, electronic health record documentation, patient education.    Anson Fret, MSN, FNP-C 11/06/2020, 4:29 PM  [facility] [address], [address] [place], [address] [place] [phone]

## 2020-11-07 ENCOUNTER — Telehealth: Payer: Self-pay

## 2020-11-07 LAB — COMPREHENSIVE METABOLIC PANEL
ALT: 13 IU/L (ref 0–44)
AST: 16 IU/L (ref 0–40)
Albumin/Globulin Ratio: 2.1 (ref 1.2–2.2)
Albumin: 4.4 g/dL (ref 4.0–5.0)
Alkaline Phosphatase: 78 IU/L (ref 44–121)
BUN/Creatinine Ratio: 15 (ref 9–20)
BUN: 12 mg/dL (ref 6–24)
Bilirubin Total: 0.3 mg/dL (ref 0.0–1.2)
CO2: 24 mmol/L (ref 20–29)
Calcium: 9.8 mg/dL (ref 8.7–10.2)
Chloride: 103 mmol/L (ref 96–106)
Creatinine, Ser: 0.78 mg/dL (ref 0.76–1.27)
Globulin, Total: 2.1 g/dL (ref 1.5–4.5)
Glucose: 101 mg/dL — ABNORMAL HIGH (ref 65–99)
Potassium: 4.4 mmol/L (ref 3.5–5.2)
Sodium: 144 mmol/L (ref 134–144)
Total Protein: 6.5 g/dL (ref 6.0–8.5)
eGFR: 111 mL/min/{1.73_m2} (ref >59)

## 2020-11-07 LAB — VALPROIC ACID LEVEL: Valproic Acid Lvl: 74 ug/mL (ref 50–100)

## 2020-11-07 LAB — LAMOTRIGINE LEVEL: Lamotrigine Lvl: 4.5 ug/mL (ref 2.0–20.0)

## 2020-11-07 LAB — CBC WITH DIFFERENTIAL/PLATELET
Basophils Absolute: 0.1 10*3/uL (ref 0.0–0.2)
Basos: 1 %
EOS (ABSOLUTE): 0.2 10*3/uL (ref 0.0–0.4)
Eos: 1 %
Hematocrit: 41.4 % (ref 37.5–51.0)
Hemoglobin: 14.7 g/dL (ref 13.0–17.7)
Immature Grans (Abs): 0.1 10*3/uL (ref 0.0–0.1)
Immature Granulocytes: 1 %
Lymphocytes Absolute: 2.5 10*3/uL (ref 0.7–3.1)
Lymphs: 21 %
MCH: 33.2 pg — ABNORMAL HIGH (ref 26.6–33.0)
MCHC: 35.5 g/dL (ref 31.5–35.7)
MCV: 94 fL (ref 79–97)
Monocytes Absolute: 0.7 10*3/uL (ref 0.1–0.9)
Monocytes: 6 %
Neutrophils Absolute: 8.4 10*3/uL — ABNORMAL HIGH (ref 1.4–7.0)
Neutrophils: 70 %
Platelets: 230 10*3/uL (ref 150–450)
RBC: 4.43 x10E6/uL (ref 4.14–5.80)
RDW: 12.4 % (ref 11.6–15.4)
WBC: 12 10*3/uL — ABNORMAL HIGH (ref 3.4–10.8)

## 2020-11-07 NOTE — Telephone Encounter (Signed)
Called pt and spoke to his mother, per DPR, regarding lab results. I informed her that his labs look ok. No concerns with AED levels.  To continue divalproex ER as prescribed by psychiatry and increase lamoritgine to 75mg  (three tablets) twice daily as discussed with both of them with Amy Lomax yesterday. I advised her to call the office with further questions, she understood.

## 2020-11-22 ENCOUNTER — Other Ambulatory Visit: Payer: Self-pay | Admitting: Nurse Practitioner

## 2020-11-22 DIAGNOSIS — M79602 Pain in left arm: Secondary | ICD-10-CM

## 2020-12-03 ENCOUNTER — Other Ambulatory Visit: Payer: Self-pay | Admitting: Neurology

## 2020-12-10 ENCOUNTER — Other Ambulatory Visit: Payer: Self-pay

## 2020-12-10 ENCOUNTER — Encounter: Payer: Self-pay | Admitting: Nurse Practitioner

## 2020-12-10 ENCOUNTER — Ambulatory Visit (INDEPENDENT_AMBULATORY_CARE_PROVIDER_SITE_OTHER): Payer: Medicaid Other | Admitting: Nurse Practitioner

## 2020-12-10 VITALS — BP 118/72 | HR 74 | Temp 98.0°F | Ht 68.0 in | Wt 147.0 lb

## 2020-12-10 DIAGNOSIS — G35 Multiple sclerosis: Secondary | ICD-10-CM | POA: Diagnosis not present

## 2020-12-10 DIAGNOSIS — Z1211 Encounter for screening for malignant neoplasm of colon: Secondary | ICD-10-CM | POA: Diagnosis not present

## 2020-12-10 DIAGNOSIS — E782 Mixed hyperlipidemia: Secondary | ICD-10-CM | POA: Diagnosis not present

## 2020-12-10 DIAGNOSIS — L659 Nonscarring hair loss, unspecified: Secondary | ICD-10-CM

## 2020-12-10 NOTE — Patient Instructions (Addendum)
High Cholesterol  High cholesterol is a condition in which the blood has high levels of a white, waxy substance similar to fat (cholesterol). The liver makes all the cholesterol that the body needs. The human body needs small amounts of cholesterol to help build cells. A person gets extra or excess cholesterol from the food that he or she eats. The blood carries cholesterol from the liver to the rest of the body. If you have high cholesterol, deposits (plaques) may build up on the walls of your arteries. Arteries are the blood vessels that carry blood away from your heart. These plaques make the arteries narrow and stiff. Cholesterol plaques increase your risk for heart attack and stroke. Work with your health care provider to keep your cholesterol levels in a healthy range. What increases the risk? The following factors may make you more likely to develop this condition:  Eating foods that are high in animal fat (saturated fat) or cholesterol.  Being overweight.  Not getting enough exercise.  A family history of high cholesterol (familial hypercholesterolemia).  Use of tobacco products.  Having diabetes. What are the signs or symptoms? There are no symptoms of this condition. How is this diagnosed? This condition may be diagnosed based on the results of a blood test.  If you are older than 47 years of age, your health care provider may check your cholesterol levels every 4-6 years.  You may be checked more often if you have high cholesterol or other risk factors for heart disease. The blood test for cholesterol measures:  "Bad" cholesterol, or LDL cholesterol. This is the main type of cholesterol that causes heart disease. The desired level is less than 100 mg/dL.  "Good" cholesterol, or HDL cholesterol. HDL helps protect against heart disease by cleaning the arteries and carrying the LDL to the liver for processing. The desired level for HDL is 60 mg/dL or higher.  Triglycerides.  These are fats that your body can store or burn for energy. The desired level is less than 150 mg/dL.  Total cholesterol. This measures the total amount of cholesterol in your blood and includes LDL, HDL, and triglycerides. The desired level is less than 200 mg/dL. How is this treated? This condition may be treated with:  Diet changes. You may be asked to eat foods that have more fiber and less saturated fats or added sugar.  Lifestyle changes. These may include regular exercise, maintaining a healthy weight, and quitting use of tobacco products.  Medicines. These are given when diet and lifestyle changes have not worked. You may be prescribed a statin medicine to help lower your cholesterol levels. Follow these instructions at home: Eating and drinking  Eat a healthy, balanced diet. This diet includes: ? Daily servings of a variety of fresh, frozen, or canned fruits and vegetables. ? Daily servings of whole grain foods that are rich in fiber. ? Foods that are low in saturated fats and trans fats. These include poultry and fish without skin, lean cuts of meat, and low-fat dairy products. ? A variety of fish, especially oily fish that contain omega-3 fatty acids. Aim to eat fish at least 2 times a week.  Avoid foods and drinks that have added sugar.  Use healthy cooking methods, such as roasting, grilling, broiling, baking, poaching, steaming, and stir-frying. Do not fry your food except for stir-frying.   Lifestyle  Get regular exercise. Aim to exercise for a total of 150 minutes a week. Increase your activity level by doing activities   such as gardening, walking, and taking the stairs.  Do not use any products that contain nicotine or tobacco, such as cigarettes, e-cigarettes, and chewing tobacco. If you need help quitting, ask your health care provider.   General instructions  Take over-the-counter and prescription medicines only as told by your health care provider.  Keep all  follow-up visits as told by your health care provider. This is important. Where to find more information  American Heart Association: www.heart.org  National Heart, Lung, and Blood Institute: PopSte[employer].is Contact a health care provider if:  You have trouble achieving or maintaining a healthy diet or weight.  You are starting an exercise program.  You are unable to stop smoking. Get help right away if:  You have chest pain.  You have trouble breathing.  You have any symptoms of a stroke. "BE FAST" is an easy way to remember the main warning signs of a stroke: ? B - Balance. Signs are dizziness, sudden trouble walking, or loss of balance. ? E - Eyes. Signs are trouble seeing or a sudden change in vision. ? F - Face. Signs are sudden weakness or numbness of the face, or the face or eyelid drooping on one side. ? A - Arms. Signs are weakness or numbness in an arm. This happens suddenly and usually on one side of the body. ? S - Speech. Signs are sudden trouble speaking, slurred speech, or trouble understanding what people say. ? T - Time. Time to call emergency services. Write down what time symptoms started.  You have other signs of a stroke, such as: ? A sudden, severe headache with no known cause. ? Nausea or vomiting. ? Seizure. These symptoms may represent a serious problem that is an emergency. Do not wait to see if the symptoms will go away. Get medical help right away. Call your local emergency services (911 in the [place] Do not drive yourself to the hospital. Summary  Cholesterol plaques increase your risk for heart attack and stroke. Work with your health care provider to keep your cholesterol levels in a healthy range.  Eat a healthy, balanced diet, get regular exercise, and maintain a healthy weight.  Do not use any products that contain nicotine or tobacco, such as cigarettes, e-cigarettes, and chewing tobacco.  Get help right away if you have any symptoms of a  stroke. This information is not intended to replace advice given to you by your health care provider. Make sure you discuss any questions you have with your health care provider. Document Revised: 07/25/2019 Document Reviewed: 07/25/2019 Elsevier Patient Education  2021 Elsevier Inc.   Heart-Healthy Eating Plan Heart-healthy meal planning includes:  Eating less unhealthy fats.  Eating more healthy fats.  Making other changes in your diet. Talk with your doctor or a diet specialist (dietitian) to create an eating plan that is right for you. What is my plan? Your doctor may recommend an eating plan that includes:  Total fat: ______% or less of total calories a day.  Saturated fat: ______% or less of total calories a day.  Cholesterol: less than _________mg a day. What are tips for following this plan? Cooking Avoid frying your food. Try to bake, boil, grill, or broil it instead. You can also reduce fat by:  Removing the skin from poultry.  Removing all visible fats from meats.  Steaming vegetables in water or broth. Meal planning  At meals, divide your plate into four equal parts: ? Fill one-half of your plate with vegetables  and green salads. ? Fill one-fourth of your plate with whole grains. ? Fill one-fourth of your plate with lean protein foods.  Eat 4-5 servings of vegetables per day. A serving of vegetables is: ? 1 cup of raw or cooked vegetables. ? 2 cups of raw leafy greens.  Eat 4-5 servings of fruit per day. A serving of fruit is: ? 1 medium whole fruit. ?  cup of dried fruit. ?  cup of fresh, frozen, or canned fruit. ?  cup of 100% fruit juice.  Eat more foods that have soluble fiber. These are apples, broccoli, carrots, beans, peas, and barley. Try to get 20-30 g of fiber per day.  Eat 4-5 servings of nuts, legumes, and seeds per week: ? 1 serving of dried beans or legumes equals  cup after being cooked. ? 1 serving of nuts is  cup. ? 1 serving of  seeds equals 1 tablespoon.   General information  Eat more home-cooked food. Eat less restaurant, buffet, and fast food.  Limit or avoid alcohol.  Limit foods that are high in starch and sugar.  Avoid fried foods.  Lose weight if you are overweight.  Keep track of how much salt (sodium) you eat. This is important if you have high blood pressure. Ask your doctor to tell you more about this.  Try to add vegetarian meals each week. Fats  Choose healthy fats. These include olive oil and canola oil, flaxseeds, walnuts, almonds, and seeds.  Eat more omega-3 fats. These include salmon, mackerel, sardines, tuna, flaxseed oil, and ground flaxseeds. Try to eat fish at least 2 times each week.  Check food labels. Avoid foods with trans fats or high amounts of saturated fat.  Limit saturated fats. ? These are often found in animal products, such as meats, butter, and cream. ? These are also found in plant foods, such as palm oil, palm kernel oil, and coconut oil.  Avoid foods with partially hydrogenated oils in them. These have trans fats. Examples are stick margarine, some tub margarines, cookies, crackers, and other baked goods. What foods can I eat? Fruits All fresh, canned (in natural juice), or frozen fruits. Vegetables Fresh or frozen vegetables (raw, steamed, roasted, or grilled). Green salads. Grains Most grains. Choose whole wheat and whole grains most of the time. Rice and pasta, including brown rice and pastas made with whole wheat. Meats and other proteins Lean, well-trimmed beef, veal, pork, and lamb. Chicken and [place] without skin. All fish and shellfish. Wild duck, rabbit, pheasant, and venison. Egg whites or low-cholesterol egg substitutes. Dried beans, peas, lentils, and tofu. Seeds and most nuts. Dairy Low-fat or nonfat cheeses, including ricotta and mozzarella. Skim or 1% milk that is liquid, powdered, or evaporated. Buttermilk that is made with low-fat milk. Nonfat or  low-fat yogurt. Fats and oils Non-hydrogenated (trans-free) margarines. Vegetable oils, including soybean, sesame, sunflower, olive, peanut, safflower, corn, canola, and cottonseed. Salad dressings or mayonnaise made with a vegetable oil. Beverages Mineral water. Coffee and tea. Diet carbonated beverages. Sweets and desserts Sherbet, gelatin, and fruit ice. Small amounts of dark chocolate. Limit all sweets and desserts. Seasonings and condiments All seasonings and condiments. The items listed above may not be a complete list of foods and drinks you can eat. Contact a dietitian for more options. What foods should I avoid? Fruits Canned fruit in heavy syrup. Fruit in cream or butter sauce. Fried fruit. Limit coconut. Vegetables Vegetables cooked in cheese, cream, or butter sauce. Fried vegetables. Grains Breads  that are made with saturated or trans fats, oils, or whole milk. Croissants. Sweet rolls. Donuts. High-fat crackers, such as cheese crackers. Meats and other proteins Fatty meats, such as hot dogs, ribs, sausage, bacon, rib-eye roast or steak. High-fat deli meats, such as salami and bologna. Caviar. Domestic duck and goose. Organ meats, such as liver. Dairy Cream, sour cream, cream cheese, and creamed cottage cheese. Whole-milk cheeses. Whole or 2% milk that is liquid, evaporated, or condensed. Whole buttermilk. Cream sauce or high-fat cheese sauce. Yogurt that is made from whole milk. Fats and oils Meat fat, or shortening. Cocoa butter, hydrogenated oils, palm oil, coconut oil, palm kernel oil. Solid fats and shortenings, including bacon fat, salt pork, lard, and butter. Nondairy cream substitutes. Salad dressings with cheese or sour cream. Beverages Regular sodas and juice drinks with added sugar. Sweets and desserts Frosting. Pudding. Cookies. Cakes. Pies. Milk chocolate or white chocolate. Buttered syrups. Full-fat ice cream or ice cream drinks. The items listed above may not be  a complete list of foods and drinks to avoid. Contact a dietitian for more information. Summary  Heart-healthy meal planning includes eating less unhealthy fats, eating more healthy fats, and making other changes in your diet.  Eat a balanced diet. This includes fruits and vegetables, low-fat or nonfat dairy, lean protein, nuts and legumes, whole grains, and heart-healthy oils and fats. This information is not intended to replace advice given to you by your health care provider. Make sure you discuss any questions you have with your health care provider. Document Revised: 10/29/2017 Document Reviewed: 10/02/2017 Elsevier Patient Education  2021 Elsevier Inc.   Call the neurologist to see if he can take biotin with his tecfidera this may help with his hair loss.   I also encourage to have hair trimmed regularly.

## 2020-12-10 NOTE — Progress Notes (Signed)
Tomasa Hose as a scribe for [name], FNP.,have documented all relevant documentation on the behalf of [name], FNP,as directed by  [name], FNP while in the presence of [name], FNP. This visit occurred during the SARS-CoV-2 public health emergency.  Safety protocols were in place, including screening questions prior to the visit, additional usage of staff PPE, and extensive cleaning of exam room while observing appropriate contact time as indicated for disinfecting solutions.  Subjective:     Patient ID: Micheal Webb , male    DOB: 10-22-1975 , 45 y.o.   MRN: [id number]   Chief Complaint  Patient presents with  . Hyperlipidemia  . Alopecia    HPI  Pt here today for Hyperlipidemia his mom also wants to discuss hair loss, and switching providers because of location   He is having hair breakage/loss in the last 5 months. He has been on tecfidera for the last year. His mother reports it is breaking off in the back and more frizzy at the top   Hyperlipidemia This is a chronic problem. The current episode started more than 1 year ago. The problem is controlled. Recent lipid tests were reviewed and are normal. He has no history of chronic renal disease. Pertinent negatives include no chest pain or shortness of breath. The current treatment provides significant improvement of lipids. There are no known risk factors for coronary artery disease.     Past Medical History:  Diagnosis Date  . Carpal tunnel syndrome on left   . MS (multiple sclerosis) (HCC)   . Schizophrenia (HCC)   . Seizure [facility])      Family History  Problem Relation Age of Onset  . Breast cancer Mother   . Diverticulitis Mother        had surgery and colostomy bag 11/2015  . Colon cancer Paternal Grandfather   . Pancreatic cancer Maternal Grandmother      Current Outpatient Medications:  .  benztropine (COGENTIN) 1 MG tablet, Take 1 mg by mouth See admin instructions. 1 tablet in  the morning and 2 tablets at bedtime., Disp: , Rfl:  .  Cholecalciferol (VITAMIN D) 125 MCG (5000 UT) CAPS, Take 125 mcg by mouth daily. , Disp: , Rfl:  .  clonazePAM (KLONOPIN) 1 MG tablet, Take 1 mg by mouth in the morning and at bedtime. , Disp: , Rfl:  .  Dimethyl Fumarate (TECFIDERA) 120 & 240 MG MISC, Take by mouth., Disp: , Rfl:  .  divalproex (DEPAKOTE ER) 500 MG 24 hr tablet, 1,500 mg at bedtime. , Disp: , Rfl: 5 .  lamoTRIgine (LAMICTAL) 25 MG tablet, Take 3 tablets (75 mg total) by mouth 2 (two) times daily., Disp: 180 tablet, Rfl: 11 .  meloxicam (MOBIC) 15 MG tablet, TAKE ONE TABLET BY MOUTH DAILY, Disp: 30 tablet, Rfl: 0 .  pravastatin (PRAVACHOL) 10 MG tablet, TAKE ONE TABLET BY MOUTH AT BEDTIME, Disp: 90 tablet, Rfl: 4 .  risperiDONE (RISPERDAL) 2 MG tablet, Take 2 mg by mouth 2 (two) times daily. , Disp: , Rfl:  .  TECFIDERA 240 MG CPDR, Take 1 capsule by mouth twice per day, Disp: 60 capsule, Rfl: 11   No Known Allergies   Review of Systems  Constitutional: Negative.  Negative for fatigue and unexpected weight change.  HENT: Negative.   Respiratory: Negative for shortness of breath.   Cardiovascular: Negative for chest pain, palpitations and leg swelling.  Endocrine: Negative for polydipsia, polyphagia and polyuria.  Musculoskeletal: Negative.  Skin: Negative.   Neurological: Negative for dizziness and headaches.  Psychiatric/Behavioral: Negative.      Today's Vitals   12/10/20 1411  BP: 118/72  Pulse: (!) 4  Temp: 98 F (36.7 C)  TempSrc: Oral  Weight: 147 lb (66.7 kg)  Height: 5\' 8"  (1.727 m)   Body mass index is 22.35 kg/m.   Objective:  Physical Exam Vitals reviewed.  Constitutional:      General: He is not in acute distress.    Appearance: Normal appearance. He is obese.  HENT:     Head: Normocephalic.  Eyes:     Extraocular Movements: Extraocular movements intact.     Conjunctiva/sclera: Conjunctivae normal.     Pupils: Pupils are equal,  round, and reactive to light.  Cardiovascular:     Rate and Rhythm: Normal rate and regular rhythm.     Pulses: Normal pulses.     Heart sounds: Normal heart sounds. No murmur heard.   Pulmonary:     Effort: Pulmonary effort is normal. No respiratory distress.     Breath sounds: Normal breath sounds. No wheezing.  Skin:    General: Skin is warm and dry.     Capillary Refill: Capillary refill takes less than 2 seconds.     Comments: Hair breakage to back of hair, thinning   Neurological:     General: No focal deficit present.     Mental Status: He is alert and oriented to person, place, and time.     Cranial Nerves: No cranial nerve deficit.     Motor: No weakness.  Psychiatric:        Mood and Affect: Mood normal.        Behavior: Behavior normal.        Thought Content: Thought content normal.        Judgment: Judgment normal.         Assessment And Plan:     1. Mixed hyperlipidemia  Chronic, controlled  Continue with current medications, tolerating well  Will consider decreasing the dose pending his labs - Lipid panel  2. Hair loss  His hair is broken off in some places, will check for metabolic causes.   He does not get his hair trimmed on a regular so I am unsure if this is the reason he feels his hair is thinning of if related to medications. I have advised his mother to inquire about his MS medications - TSH - Vitamin B12 - VITAMIN D 25 Hydroxy (Vit-D Deficiency, Fractures)  3. Multiple sclerosis (HCC)  4. Encounter for screening colonoscopy According to USPTF Colorectal cancer Screening guidelines. Colonoscopy is recommended every 10 years, starting at age 83 years. Will refer to GI for colon cancer screening. - Ambulatory referral to Gastroenterology   He is looking for a provider closer to where he is living in the [place] area  Patient was given opportunity to ask questions. Patient verbalized understanding of the plan and was able to repeat  key elements of the plan. All questions were answered to their satisfaction.  52, FNP   I, [name], FNP, have reviewed all documentation for this visit. The documentation on 12/10/20 for the exam, diagnosis, procedures, and orders are all accurate and complete.   IF YOU HAVE BEEN REFERRED TO A SPECIALIST, IT MAY TAKE 1-2 WEEKS TO SCHEDULE/PROCESS THE REFERRAL. IF YOU HAVE NOT HEARD FROM US/SPECIALIST IN TWO WEEKS, PLEASE GIVE 02/09/21 A CALL AT [phone].   THE PATIENT IS ENCOURAGED  TO PRACTICE SOCIAL DISTANCING DUE TO THE COVID-19 PANDEMIC.

## 2020-12-11 LAB — LIPID PANEL
Chol/HDL Ratio: 4.6 ratio (ref 0.0–5.0)
Cholesterol, Total: 175 mg/dL (ref 100–199)
HDL: 38 mg/dL — ABNORMAL LOW (ref >39)
LDL Chol Calc (NIH): 120 mg/dL — ABNORMAL HIGH (ref 0–99)
Triglycerides: 93 mg/dL (ref 0–149)
VLDL Cholesterol Cal: 17 mg/dL (ref 5–40)

## 2020-12-11 LAB — VITAMIN B12: Vitamin B-12: 573 pg/mL (ref 232–1245)

## 2020-12-11 LAB — TSH: TSH: 1.58 u[IU]/mL (ref 0.450–4.500)

## 2020-12-11 LAB — VITAMIN D 25 HYDROXY (VIT D DEFICIENCY, FRACTURES): Vit D, 25-Hydroxy: 100 ng/mL (ref 30.0–100.0)

## 2020-12-18 ENCOUNTER — Other Ambulatory Visit: Payer: Self-pay | Admitting: Nurse Practitioner

## 2020-12-18 ENCOUNTER — Telehealth: Payer: Self-pay

## 2020-12-18 DIAGNOSIS — M79602 Pain in left arm: Secondary | ICD-10-CM

## 2020-12-18 NOTE — Telephone Encounter (Signed)
Error

## 2021-01-21 ENCOUNTER — Other Ambulatory Visit: Payer: Self-pay | Admitting: Nurse Practitioner

## 2021-01-21 DIAGNOSIS — M79602 Pain in left arm: Secondary | ICD-10-CM

## 2021-01-21 NOTE — Telephone Encounter (Signed)
You may refill the meloxicam 90 day supply with one refill

## 2021-01-21 NOTE — Telephone Encounter (Signed)
Meloxicam is it ok

## 2021-02-25 ENCOUNTER — Other Ambulatory Visit: Payer: Self-pay | Admitting: Nurse Practitioner

## 2021-02-25 DIAGNOSIS — M79602 Pain in left arm: Secondary | ICD-10-CM

## 2021-05-09 ENCOUNTER — Ambulatory Visit: Payer: Medicaid Other | Admitting: Neurology

## 2021-05-09 ENCOUNTER — Encounter: Payer: Self-pay | Admitting: Neurology

## 2021-05-09 VITALS — BP 100/64 | HR 93 | Ht 68.0 in | Wt 135.0 lb

## 2021-05-09 DIAGNOSIS — G40309 Generalized idiopathic epilepsy and epileptic syndromes, not intractable, without status epilepticus: Secondary | ICD-10-CM

## 2021-05-09 DIAGNOSIS — Z79899 Other long term (current) drug therapy: Secondary | ICD-10-CM

## 2021-05-09 DIAGNOSIS — G35 Multiple sclerosis: Secondary | ICD-10-CM

## 2021-05-09 DIAGNOSIS — F209 Schizophrenia, unspecified: Secondary | ICD-10-CM

## 2021-05-09 NOTE — Progress Notes (Signed)
[facility]  PATIENT: Micheal Webb DOB: Oct 09, 1975  REFERRING DOCTOR OR PCP:  [name], FNP SOURCE: patient, notes from ED, lab and imaging reports, images personally reviewed.   _________________________________   HISTORICAL  CHIEF COMPLAINT:  Chief Complaint  Patient presents with   Follow-up    Pt with wife, rm 1, [name]. Last OV 11/06/20. Treatment with Tecfidera. She has noticed weight loss and unsure what could be causing. He denies the medication making him nauseous. He is not trying to loose weight mom has noticed balance concerns.     HISTORY OF PRESENT ILLNESS:  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  He has not had any new issues of slurred speech, numbness, weakness or other neurologic symptoms.   Gait is off balanced and his mom notes it is worse than last year.    Due to balance he can't ride a bike.      Strength and sensation are fine.   Bladder function is fine.   Vision is fine.     He has some fatigue.    He sleeps well most nights.    He has no recent seizures (GTC followed by confusion) last one earlier this year.   The lamotrigine dose was increased and he has done better.  [name]    He is on Depakote 1500 mg nightly, lamotrigine 75 mg po bid and clonazepam 1 mg po bid.    He has schizophrenia and sees [name] ([facility]).     MS History: He presented to the [facility] emergency department 12/19/2019 age 45. At that time he had slurred speech, gait ataxia and lethargy. He also felt more confused.  An MRI of the brain was performed showing the multiple lesions including some that enhanced, in a pattern and distribution consistent with multiple sclerosis. We do not have an older MRI for comparison but a CT scan done before the MRI shows foci that were not present on a CT scan done several years ago. He was admitted  to the hospital but left AMA the next day. He did receive at least a couple doses of IV Solu-Medrol and he felt his symptoms improved.      MRI of the brain 4/13/2021shows multiple T2/FLAIR hyperintense foci in the periventricular, juxtacortical and deep white matter of the hemispheres and a few foci in the brainstem. Some periventricular foci are radially oriented to the ventricles. About 8 of the foci in the hemispheres enhance after contrast. The enhancement is when shaped in general.    REVIEW OF SYSTEMS: Constitutional: No fevers, chills, sweats, or change in appetite.  Sleeps well but irregular hours Eyes: No visual changes, double vision, eye pain Ear, nose and throat: No hearing loss, ear pain, nasal congestion, sore throat Cardiovascular: No chest pain, palpitations Respiratory:  No shortness of breath at rest or with exertion.   No wheezes GastrointestinaI: No nausea, vomiting, diarrhea, abdominal pain, fecal incontinence Genitourinary:  No dysuria, urinary retention or frequency.  No nocturia. Musculoskeletal:  notes neck pain and sometimes arm pain Integumentary: No rash, pruritus, skin lesions Neurological: as above Psychiatric: see above Endocrine: No palpitations, diaphoresis, change in appetite, change in weigh or increased thirst Hematologic/Lymphatic:  No anemia, purpura, petechiae. Allergic/Immunologic: No itchy/runny eyes, nasal congestion, recent allergic reactions, rashes  ALLERGIES: No Known Allergies  HOME MEDICATIONS:  Current Outpatient Medications:  benztropine (COGENTIN) 1 MG tablet, Take 1 mg by mouth See admin instructions. 1 tablet in the morning and 2 tablets at bedtime., Disp: , Rfl:    Cholecalciferol (VITAMIN D) 125 MCG (5000 UT) CAPS, Take 125 mcg by mouth daily. , Disp: , Rfl:    clonazePAM (KLONOPIN) 1 MG tablet, Take 1 mg by mouth in the morning and at bedtime. , Disp: , Rfl:    divalproex (DEPAKOTE ER) 500 MG 24 hr tablet, 1,500 mg at bedtime.  , Disp: , Rfl: 5   lamoTRIgine (LAMICTAL) 25 MG tablet, Take 3 tablets (75 mg total) by mouth 2 (two) times daily., Disp: 180 tablet, Rfl: 11   meloxicam (MOBIC) 15 MG tablet, TAKE ONE TABLET BY MOUTH DAILY, Disp: 90 tablet, Rfl: 1   pravastatin (PRAVACHOL) 10 MG tablet, TAKE ONE TABLET BY MOUTH AT BEDTIME, Disp: 90 tablet, Rfl: 4   risperiDONE (RISPERDAL) 2 MG tablet, Take 2 mg by mouth 2 (two) times daily. , Disp: , Rfl:    TECFIDERA 240 MG CPDR, Take 1 capsule by mouth twice per day, Disp: 60 capsule, Rfl: 11  PAST MEDICAL HISTORY: Past Medical History:  Diagnosis Date   Carpal tunnel syndrome on left    MS (multiple sclerosis) (HCC)    Schizophrenia (HCC)    Seizure (HCC)     PAST SURGICAL HISTORY: Past Surgical History:  Procedure Laterality Date   None      FAMILY HISTORY: Family History  Problem Relation Age of Onset   Breast cancer Mother    Diverticulitis Mother        had surgery and colostomy bag 11/2015   Colon cancer Paternal Grandfather    Pancreatic cancer Maternal Grandmother     SOCIAL HISTORY:  Social History   Socioeconomic History   Marital status: Single    Spouse name: Not on file   Number of children: 0   Years of education: 8   Highest education level: Not on file  Occupational History   Occupation: Disabled   Tobacco Use   Smoking status: Every Day    Packs/day: 1.00    Types: Cigarettes   Smokeless tobacco: Never  Substance and Sexual Activity   Alcohol use: Yes    Comment: sometimes    Drug use: No    Comment: Denies   Sexual activity: Not on file  Other Topics Concern   Not on file  Social History Narrative   Patient lives alone.    Patient is on disability.   Patient is single.    Caffeine use: coffee    Social Determinants of Corporate investment banker Strain: Not on file  Food Insecurity: Not on file  Transportation Needs: Not on file  Physical Activity: Not on file  Stress: Not on file  Social Connections: Not on  file  Intimate Partner Violence: Not on file     PHYSICAL EXAM  Vitals:   05/09/21 1514  BP: 100/64  Pulse: 93  Weight: 135 lb (61.2 kg)  Height: 5\' 8"  (1.727 m)    Body mass index is 20.53 kg/m.  No results found.  General: The patient is well-developed and well-nourished and in no acute distress.  Neck with good ROM   Skin: Extremities are without rash or  edema.   Neurologic Exam  Mental status: The patient is alert and oriented x 3 at the time of the examination. The patient has apparent normal recent and remote memory, with an apparently normal attention span  and concentration ability.   Speech is normal.  Cranial nerves: Extraocular movements are full.   Mildly reduced color vision OD.  Facial symmetry is present. There is good facial sensation to soft touch bilaterally.Facial strength is normal.  Trapezius and sternocleidomastoid strength is normal. No dysarthria is noted.  No obvious hearing deficits are noted.  Motor:  Muscle bulk is normal.   Tone is normal. Strength is  5 / 5 in all 4 extremities.   Sensory: Sensory testing is intact to pinprick, soft touch and vibration sensation in all 4 extremities.  Coordination: Cerebellar testing reveals good finger-nose-finger and heel-to-shin bilaterally.  Gait and station: Station is normal.   Gait is mildly wide.   Tandem gait is poor.  Romberg is negative.   Reflexes: Deep tendon reflexes are symmetric and normal bilaterally.         DIAGNOSTIC DATA (LABS, IMAGING, TESTING) - I reviewed patient records, labs, notes, testing and imaging myself where available.  Lab Results  Component Value Date   WBC 12.0 (H) 11/06/2020   HGB 14.7 11/06/2020   HCT 41.4 11/06/2020   MCV 94 11/06/2020   PLT 230 11/06/2020      Component Value Date/Time   NA 144 11/06/2020 1615   K 4.4 11/06/2020 1615   CL 103 11/06/2020 1615   CO2 24 11/06/2020 1615   GLUCOSE 101 (H) 11/06/2020 1615   GLUCOSE 167 (H) 12/22/2019 1239    BUN 12 11/06/2020 1615   CREATININE 0.78 11/06/2020 1615   CALCIUM 9.8 11/06/2020 1615   PROT 6.5 11/06/2020 1615   ALBUMIN 4.4 11/06/2020 1615   AST 16 11/06/2020 1615   ALT 13 11/06/2020 1615   ALKPHOS 78 11/06/2020 1615   BILITOT 0.3 11/06/2020 1615   GFRNONAA 111 06/11/2020 1544   GFRAA 129 06/11/2020 1544   Lab Results  Component Value Date   CHOL 175 12/10/2020   HDL 38 (L) 12/10/2020   LDLCALC 120 (H) 12/10/2020   TRIG 93 12/10/2020   CHOLHDL 4.6 12/10/2020       ASSESSMENT AND PLAN  Multiple sclerosis (HCC) - Plan: CBC with Differential/Platelet, Hepatic function panel, MR BRAIN W WO CONTRAST  Generalized convulsive epilepsy (HCC) - Plan: CBC with Differential/Platelet, Hepatic function panel, Valproic Acid Level, Lamotrigine level  High risk medication use - Plan: CBC with Differential/Platelet, Hepatic function panel, Lamotrigine level  Schizophrenia, unspecified type (HCC) - Plan: Lamotrigine level   1.  Continue Tecfidera.   Check labs today   Check MRI to determine if any subclinical progression .   If occurring we can switch to a more efficacious medication.   2.  Continue Depakote and lamotrigine at current dose.  Check VPA and lamotrigine  level 3.  Stay active and exercise as tolerated. 4.   rtc 6 months or sooner if new or worsening neurologic issues.   Susane Bey A. Epimenio Foot, MD, [facility] 05/09/2021, 4:06 PM Certified in Neurology, Clinical Neurophysiology, Sleep Medicine and Neuroimaging  [facility] [address], [address] [place], [place] [address] [phone]

## 2021-05-11 LAB — CBC WITH DIFFERENTIAL/PLATELET
Basophils Absolute: 0.1 10*3/uL (ref 0.0–0.2)
Basos: 1 %
EOS (ABSOLUTE): 0.2 10*3/uL (ref 0.0–0.4)
Eos: 1 %
Hematocrit: 42.1 % (ref 37.5–51.0)
Hemoglobin: 14.9 g/dL (ref 13.0–17.7)
Immature Grans (Abs): 0.1 10*3/uL (ref 0.0–0.1)
Immature Granulocytes: 1 %
Lymphocytes Absolute: 2.4 10*3/uL (ref 0.7–3.1)
Lymphs: 18 %
MCH: 33 pg (ref 26.6–33.0)
MCHC: 35.4 g/dL (ref 31.5–35.7)
MCV: 93 fL (ref 79–97)
Monocytes Absolute: 0.9 10*3/uL (ref 0.1–0.9)
Monocytes: 7 %
Neutrophils Absolute: 9.8 10*3/uL — ABNORMAL HIGH (ref 1.4–7.0)
Neutrophils: 72 %
Platelets: 215 10*3/uL (ref 150–450)
RBC: 4.51 x10E6/uL (ref 4.14–5.80)
RDW: 11.8 % (ref 11.6–15.4)
WBC: 13.3 10*3/uL — ABNORMAL HIGH (ref 3.4–10.8)

## 2021-05-11 LAB — HEPATIC FUNCTION PANEL
ALT: 11 IU/L (ref 0–44)
AST: 15 IU/L (ref 0–40)
Albumin: 4.7 g/dL (ref 4.0–5.0)
Alkaline Phosphatase: 80 IU/L (ref 44–121)
Bilirubin Total: 0.5 mg/dL (ref 0.0–1.2)
Bilirubin, Direct: 0.18 mg/dL (ref 0.00–0.40)
Total Protein: 6.9 g/dL (ref 6.0–8.5)

## 2021-05-11 LAB — LAMOTRIGINE LEVEL: Lamotrigine Lvl: 7.2 ug/mL (ref 2.0–20.0)

## 2021-05-11 LAB — VALPROIC ACID LEVEL: Valproic Acid Lvl: 76 ug/mL (ref 50–100)

## 2021-05-14 ENCOUNTER — Telehealth: Payer: Self-pay | Admitting: Neurology

## 2021-05-14 NOTE — Telephone Encounter (Signed)
medicaid order sent to [facility]. NPR they will reach out to the patient to schedule.  

## 2021-05-21 ENCOUNTER — Other Ambulatory Visit: Payer: Self-pay | Admitting: Neurology

## 2021-05-21 MED ORDER — LAMOTRIGINE 100 MG PO TABS: 100.0000 mg | ORAL_TABLET | Freq: Two times a day (BID) | ORAL | 5 refills | Status: DC

## 2021-06-13 ENCOUNTER — Ambulatory Visit
Admission: RE | Admit: 2021-06-13 | Discharge: 2021-06-13 | Disposition: A | Discharge status: Home / Self Care | Payer: Medicaid Other | Source: Ambulatory Visit | Attending: Neurology | Admitting: Neurology

## 2021-06-13 ENCOUNTER — Other Ambulatory Visit: Payer: Self-pay

## 2021-06-13 DIAGNOSIS — G35 Multiple sclerosis: Secondary | ICD-10-CM

## 2021-06-13 MED ORDER — GADOBENATE DIMEGLUMINE 529 MG/ML IV SOLN
12.0000 mL | Freq: Once | INTRAVENOUS | Status: AC | PRN
  Administered 2021-06-13: 12 mL via INTRAVENOUS

## 2021-06-17 ENCOUNTER — Ambulatory Visit (INDEPENDENT_AMBULATORY_CARE_PROVIDER_SITE_OTHER): Payer: Medicaid Other | Admitting: Nurse Practitioner

## 2021-06-17 ENCOUNTER — Other Ambulatory Visit: Payer: Self-pay

## 2021-06-17 ENCOUNTER — Encounter: Payer: Self-pay | Admitting: Nurse Practitioner

## 2021-06-17 VITALS — BP 112/80 | HR 75 | Temp 98.5°F | Ht 65.8 in | Wt 146.6 lb

## 2021-06-17 DIAGNOSIS — E782 Mixed hyperlipidemia: Secondary | ICD-10-CM | POA: Diagnosis not present

## 2021-06-17 DIAGNOSIS — H6123 Impacted cerumen, bilateral: Secondary | ICD-10-CM

## 2021-06-17 DIAGNOSIS — Z0000 Encounter for general adult medical examination without abnormal findings: Secondary | ICD-10-CM | POA: Diagnosis not present

## 2021-06-17 DIAGNOSIS — D72829 Elevated white blood cell count, unspecified: Secondary | ICD-10-CM

## 2021-06-17 DIAGNOSIS — G40309 Generalized idiopathic epilepsy and epileptic syndromes, not intractable, without status epilepticus: Secondary | ICD-10-CM

## 2021-06-17 DIAGNOSIS — F209 Schizophrenia, unspecified: Secondary | ICD-10-CM

## 2021-06-17 DIAGNOSIS — E559 Vitamin D deficiency, unspecified: Secondary | ICD-10-CM | POA: Diagnosis not present

## 2021-06-17 DIAGNOSIS — Z720 Tobacco use: Secondary | ICD-10-CM

## 2021-06-17 DIAGNOSIS — G35 Multiple sclerosis: Secondary | ICD-10-CM

## 2021-06-17 NOTE — Patient Instructions (Addendum)
Health Maintenance, Male Adopting a healthy lifestyle and getting preventive care are important in promoting health and wellness. Ask your health care provider about: The right schedule for you to have regular tests and exams. Things you can do on your own to prevent diseases and keep yourself healthy. What should I know about diet, weight, and exercise? Eat a healthy diet  Eat a diet that includes plenty of vegetables, fruits, low-fat dairy products, and lean protein. Do not eat a lot of foods that are high in solid fats, added sugars, or sodium. Maintain a healthy weight Body mass index (BMI) is a measurement that can be used to identify possible weight problems. It estimates body fat based on height and weight. Your health care provider can help determine your BMI and help you achieve or maintain a healthy weight. Get regular exercise Get regular exercise. This is one of the most important things you can do for your health. Most adults should: Exercise for at least 150 minutes each week. The exercise should increase your heart rate and make you sweat (moderate-intensity exercise). Do strengthening exercises at least twice a week. This is in addition to the moderate-intensity exercise. Spend less time sitting. Even light physical activity can be beneficial. Watch cholesterol and blood lipids Have your blood tested for lipids and cholesterol at 47 years of age, then have this test every 5 years. You may need to have your cholesterol levels checked more often if: Your lipid or cholesterol levels are high. You are older than 47 years of age. You are at high risk for heart disease. What should I know about cancer screening? Many types of cancers can be detected early and may often be prevented. Depending on your health history and family history, you may need to have cancer screening at various ages. This may include screening for: Colorectal cancer. Prostate cancer. Skin cancer. Lung  cancer. What should I know about heart disease, diabetes, and high blood pressure? Blood pressure and heart disease High blood pressure causes heart disease and increases the risk of stroke. This is more likely to develop in people who have high blood pressure readings, are of African descent, or are overweight. Talk with your health care provider about your target blood pressure readings. Have your blood pressure checked: Every 3-5 years if you are 18-39 years of age. Every year if you are 40 years old or older. If you are between the ages of 65 and 75 and are a current or former smoker, ask your health care provider if you should have a one-time screening for abdominal aortic aneurysm (AAA). Diabetes Have regular diabetes screenings. This checks your fasting blood sugar level. Have the screening done: Once every three years after age 45 if you are at a normal weight and have a low risk for diabetes. More often and at a younger age if you are overweight or have a high risk for diabetes. What should I know about preventing infection? Hepatitis B If you have a higher risk for hepatitis B, you should be screened for this virus. Talk with your health care provider to find out if you are at risk for hepatitis B infection. Hepatitis C Blood testing is recommended for: Everyone born from 1945 through 1965. Anyone with known risk factors for hepatitis C. Sexually transmitted infections (STIs) You should be screened each year for STIs, including gonorrhea and chlamydia, if: You are sexually active and are younger than 47 years of age. You are older than 47 years   of age and your health care provider tells you that you are at risk for this type of infection. Your sexual activity has changed since you were last screened, and you are at increased risk for chlamydia or gonorrhea. Ask your health care provider if you are at risk. Ask your health care provider about whether you are at high risk for HIV.  Your health care provider may recommend a prescription medicine to help prevent HIV infection. If you choose to take medicine to prevent HIV, you should first get tested for HIV. You should then be tested every 3 months for as long as you are taking the medicine. Follow these instructions at home: Lifestyle Do not use any products that contain nicotine or tobacco, such as cigarettes, e-cigarettes, and chewing tobacco. If you need help quitting, ask your health care provider. Do not use street drugs. Do not share needles. Ask your health care provider for help if you need support or information about quitting drugs. Alcohol use Do not drink alcohol if your health care provider tells you not to drink. If you drink alcohol: Limit how much you have to 0-2 drinks a day. Be aware of how much alcohol is in your drink. In the [place] one drink equals one 12 oz bottle of beer (355 mL), one 5 oz glass of wine (148 mL), or one 1 oz glass of hard liquor (44 mL). General instructions Schedule regular health, dental, and eye exams. Stay current with your vaccines. Tell your health care provider if: You often feel depressed. You have ever been abused or do not feel safe at home. Summary Adopting a healthy lifestyle and getting preventive care are important in promoting health and wellness. Follow your health care provider's instructions about healthy diet, exercising, and getting tested or screened for diseases. Follow your health care provider's instructions on monitoring your cholesterol and blood pressure. This information is not intended to replace advice given to you by your health care provider. Make sure you discuss any questions you have with your health care provider. Document Revised: 11/02/2020 Document Reviewed: 08/18/2018 Elsevier Patient Education  2022 Elsevier Inc.    Dr. Cheri Rous, DO Family Physician [address] [place], [place] [address] [phone]   Dr. Gabriel Cirri, DO, [facility] Physician [address] [place], [place] [address] [phone]

## 2021-06-17 NOTE — Progress Notes (Signed)
I,Yamilka J Llittleton,acting as a Education administrator for [employer], FNP.,have documented all relevant documentation on the behalf of Minette Brine, FNP,as directed by  Minette Brine, FNP while in the presence of Minette Brine, [place].   This visit occurred during the SARS-CoV-2 public health emergency.  Safety protocols were in place, including screening questions prior to the visit, additional usage of staff PPE, and extensive cleaning of exam room while observing appropriate contact time as indicated for disinfecting solutions.  Subjective:     Patient ID: Micheal Webb , male    DOB: [birth year] , 45 y.o.   MRN: [id number]   Chief Complaint  Patient presents with   Annual Exam    HPI  Patient here for hm. He has continued to have seizures and Dr. Felecia Shelling increased his Lamictal to 100 mg twice a day.    Wt Readings from Last 3 Encounters: 06/17/21 : 146 lb 9.6 oz (66.5 kg) 05/09/21 : 135 lb (61.2 kg) 12/10/20 : 147 lb (66.7 kg)      Past Medical History:  Diagnosis Date   Carpal tunnel syndrome on left    MS (multiple sclerosis) (HCC)    Schizophrenia (HCC)    Seizure ([place])      Family History  Problem Relation Age of Onset   Breast cancer Mother    Diverticulitis Mother        had surgery and colostomy bag 11/2015   Colon cancer Paternal Grandfather    Pancreatic cancer Maternal Grandmother      Current Outpatient Medications:    benztropine (COGENTIN) 1 MG tablet, Take 1 mg by mouth See admin instructions. 1 tablet in the morning and 2 tablets at bedtime., Disp: , Rfl:    Cholecalciferol (VITAMIN D) 125 MCG (5000 UT) CAPS, Take 125 mcg by mouth daily. , Disp: , Rfl:    clonazePAM (KLONOPIN) 1 MG tablet, Take 1 mg by mouth in the morning and at bedtime. , Disp: , Rfl:    divalproex (DEPAKOTE ER) 500 MG 24 hr tablet, 1,500 mg at bedtime. , Disp: , Rfl: 5   lamoTRIgine (LAMICTAL) 100 MG tablet, Take 1 tablet (100 mg total) by mouth 2 (two) times daily., Disp: 60 tablet, Rfl: 5    meloxicam (MOBIC) 15 MG tablet, TAKE ONE TABLET BY MOUTH DAILY, Disp: 90 tablet, Rfl: 1   pravastatin (PRAVACHOL) 10 MG tablet, TAKE ONE TABLET BY MOUTH AT BEDTIME, Disp: 90 tablet, Rfl: 4   risperiDONE (RISPERDAL) 2 MG tablet, Take 2 mg by mouth 2 (two) times daily. , Disp: , Rfl:    TECFIDERA 240 MG CPDR, Take 1 capsule by mouth twice per day, Disp: 60 capsule, Rfl: 11   No Known Allergies   Men's preventive visit. Patient Health Questionnaire (PHQ-2) is  [place] Office Visit from 12/10/2020 in [facility]  PHQ-2 Total Score 0      Patient is on a Regular diet. Exercising with walking. Marital status: Single. Relevant history for alcohol use is:  Social History   Substance and Sexual Activity  Alcohol Use Yes   Comment: sometimes   . Relevant history for tobacco use is:  Social History   Tobacco Use  Smoking Status Every Day   Packs/day: 1.00   Years: 29.00   Pack years: 29.00   Types: Cigarettes  Smokeless Tobacco Never  .   Review of Systems  Constitutional: Negative.   HENT: Negative.    Eyes: Negative.   Respiratory: Negative.  Cardiovascular: Negative.   Gastrointestinal: Negative.   Endocrine: Negative.   Genitourinary: Negative.   Musculoskeletal: Negative.   Skin: Negative.   Hematological: Negative.   Psychiatric/Behavioral: Negative.      Today's Vitals   06/17/21 1440  BP: 112/80  Pulse: 75  Temp: 98.5 F (36.9 C)  Weight: 146 lb 9.6 oz (66.5 kg)  Height: 5' 5.8" (1.671 m)  PainSc: 0-No pain   Body mass index is 23.81 kg/m.   Objective:  Physical Exam Vitals reviewed.  Constitutional:      General: He is not in acute distress.    Appearance: Normal appearance. He is obese.  HENT:     Head: Normocephalic and atraumatic.     Right Ear: External ear normal. There is impacted cerumen.     Left Ear: External ear normal. There is impacted cerumen.     Nose:     Comments: Deferred - masked    Mouth/Throat:      Comments: Deferred - masked Eyes:     Pupils: Pupils are equal, round, and reactive to light.  Cardiovascular:     Rate and Rhythm: Normal rate and regular rhythm.     Pulses: Normal pulses.     Heart sounds: Normal heart sounds. No murmur heard. Pulmonary:     Effort: Pulmonary effort is normal. No respiratory distress.     Breath sounds: Normal breath sounds. No wheezing.  Abdominal:     General: Abdomen is flat. Bowel sounds are normal. There is no distension.     Palpations: Abdomen is soft.  Genitourinary:    Comments: Patient refuses to get undressed Musculoskeletal:        General: No swelling, tenderness or deformity. Normal range of motion.     Cervical back: Normal range of motion and neck supple.  Skin:    General: Skin is warm.     Capillary Refill: Capillary refill takes less than 2 seconds.  Neurological:     General: No focal deficit present.     Mental Status: He is alert and oriented to person, place, and time.     Cranial Nerves: No cranial nerve deficit.     Motor: No weakness.  Psychiatric:        Mood and Affect: Mood normal.        Behavior: Behavior normal.        Thought Content: Thought content normal.        Judgment: Judgment normal.        Assessment And Plan:    1. Encounter for general adult medical examination w/o abnormal findings Behavior modifications discussed and diet history reviewed.   Pt will continue to exercise regularly and modify diet with low GI, plant based foods and decrease intake of processed foods.  Recommend intake of daily multivitamin, Vitamin D, and calcium.  Recommend colonoscopy (mother reports she has been in contact with GI and will call them for an appt with he is ready) for preventive screenings, as well as recommend immunizations that include influenza, TDAP  2. Mixed hyperlipidemia Comments: Stable, continue current medications - Lipid panel - CMP14+EGFR  3. Vitamin D deficiency Will check vitamin D level and  supplement as needed.    Also encouraged to spend 15 minutes in the sun daily.   4. Tobacco abuse Comments: Will check CXR due to 30 year history of smoking, he also has a slightly elevated white count but he refuses to get a urine sample. - DG Chest 2 View;  Future  5. Generalized convulsive epilepsy ([place]) Comments: Continue follow up with Dr. Algie Coffer.  - Valproic Acid  6. Leukocytosis, unspecified type Comments: Has had a slightly elevated white count, unknown causes. he refuses to get a urine sample but will check a CXR - CBC with Differential/Platelet  7. Schizophrenia, unspecified type [facility]) Comments: Continue follow up with Donata Clay, will forward lab results   8. Multiple sclerosis ([place]) Comments: Continue follow up with Dr. Algie Coffer  9. Bilateral impacted cerumen Comments: Difficulty with removal of cerumen to right ear, encouraged to use debrox drops   Patient was given opportunity to ask questions. Patient verbalized understanding of the plan and was able to repeat key elements of the plan. All questions were answered to their satisfaction.   Minette Brine, FNP   I, Minette Brine, FNP, have reviewed all documentation for this visit. The documentation on 06/17/21 for the exam, diagnosis, procedures, and orders are all accurate and complete.  THE PATIENT IS ENCOURAGED TO PRACTICE SOCIAL DISTANCING DUE TO THE COVID-19 PANDEMIC.

## 2021-06-18 LAB — CBC WITH DIFFERENTIAL/PLATELET
Basophils Absolute: 0.1 10*3/uL (ref 0.0–0.2)
Basos: 1 %
EOS (ABSOLUTE): 0.1 10*3/uL (ref 0.0–0.4)
Eos: 1 %
Hematocrit: 37 % — ABNORMAL LOW (ref 37.5–51.0)
Hemoglobin: 13.4 g/dL (ref 13.0–17.7)
Immature Grans (Abs): 0.1 10*3/uL (ref 0.0–0.1)
Immature Granulocytes: 1 %
Lymphocytes Absolute: 2.5 10*3/uL (ref 0.7–3.1)
Lymphs: 23 %
MCH: 33.3 pg — ABNORMAL HIGH (ref 26.6–33.0)
MCHC: 36.2 g/dL — ABNORMAL HIGH (ref 31.5–35.7)
MCV: 92 fL (ref 79–97)
Monocytes Absolute: 0.7 10*3/uL (ref 0.1–0.9)
Monocytes: 7 %
Neutrophils Absolute: 7.1 10*3/uL — ABNORMAL HIGH (ref 1.4–7.0)
Neutrophils: 67 %
Platelets: 227 10*3/uL (ref 150–450)
RBC: 4.02 x10E6/uL — ABNORMAL LOW (ref 4.14–5.80)
RDW: 12.1 % (ref 11.6–15.4)
WBC: 10.5 10*3/uL (ref 3.4–10.8)

## 2021-06-18 LAB — CMP14+EGFR
ALT: 10 IU/L (ref 0–44)
AST: 13 IU/L (ref 0–40)
Albumin/Globulin Ratio: 2.5 — ABNORMAL HIGH (ref 1.2–2.2)
Albumin: 4.7 g/dL (ref 4.0–5.0)
Alkaline Phosphatase: 75 IU/L (ref 44–121)
BUN/Creatinine Ratio: 17 (ref 9–20)
BUN: 10 mg/dL (ref 6–24)
Bilirubin Total: <0.2 mg/dL (ref 0.0–1.2)
CO2: 26 mmol/L (ref 20–29)
Calcium: 9.8 mg/dL (ref 8.7–10.2)
Chloride: 97 mmol/L (ref 96–106)
Creatinine, Ser: 0.6 mg/dL — ABNORMAL LOW (ref 0.76–1.27)
Globulin, Total: 1.9 g/dL (ref 1.5–4.5)
Glucose: 70 mg/dL (ref 70–99)
Potassium: 4.6 mmol/L (ref 3.5–5.2)
Sodium: 137 mmol/L (ref 134–144)
Total Protein: 6.6 g/dL (ref 6.0–8.5)
eGFR: 120 mL/min/{1.73_m2} (ref >59)

## 2021-06-18 LAB — LIPID PANEL
Chol/HDL Ratio: 3.4 ratio (ref 0.0–5.0)
Cholesterol, Total: 160 mg/dL (ref 100–199)
HDL: 47 mg/dL (ref >39)
LDL Chol Calc (NIH): 102 mg/dL — ABNORMAL HIGH (ref 0–99)
Triglycerides: 54 mg/dL (ref 0–149)
VLDL Cholesterol Cal: 11 mg/dL (ref 5–40)

## 2021-06-18 LAB — VALPROIC ACID LEVEL: Valproic Acid Lvl: 90 ug/mL (ref 50–100)

## 2021-07-08 ENCOUNTER — Telehealth: Payer: Self-pay | Admitting: Family Medicine

## 2021-07-08 DIAGNOSIS — G35 Multiple sclerosis: Secondary | ICD-10-CM

## 2021-07-08 MED ORDER — DIMETHYL FUMARATE 240 MG PO CPDR
DELAYED_RELEASE_CAPSULE | ORAL | 11 refills | Status: DC
Start: 2021-07-08 — End: 2022-05-26

## 2021-07-08 NOTE — Telephone Encounter (Signed)
Generic dimethyl fumarate now preferred for pt insurance. I called and LMV for pt mother, [name] (on DPR) to let her know we will be sending in generic instead since this is now preferred to [facility]. Advised it may require PA. If it does, we will work on this for pt. Provided office # if they have any further questions/concerns.

## 2021-07-08 NOTE — Telephone Encounter (Signed)
Vernona Rieger with [facility] called stating they are needing a PA for the pt's TECFIDERA 240 MG CPDR

## 2021-07-08 NOTE — Telephone Encounter (Signed)
Patient's mom called back.  I was able to advise that his insurance now prefers the generic form of Tecfidera. Advised that the medication has been sent to the pharmacy for the patient.  Given that it is the preferred it may not require a prior Auth but if it does the pharmacy will let us know and we will complete that if needed.  Patient will be out of medication by November 6 and just wants to ensure that he is able to get the medication prior to that.  Advised her to contact the pharmacy and check on the status this afternoon.  She verbalized understanding.

## 2021-07-10 NOTE — Telephone Encounter (Signed)
Hope with [facility] called stating a PA is needed for the Tecfidera. Can either call and do a verbal PA AT [phone] OR ON THE WEB AT [email or web address]. If the doctor does not decide to do the PA please call [facility] and inform them of that.

## 2021-07-10 NOTE — Telephone Encounter (Signed)
[facility] back and spoke w/ Micheal Webb. Showing refill too soon. Advised no PA required, pt should be able to fill generic dimethyl fumarate. She verbalized understanding. She will notify pharmacy.

## 2021-08-19 ENCOUNTER — Other Ambulatory Visit: Payer: Self-pay | Admitting: Nurse Practitioner

## 2021-08-19 DIAGNOSIS — M79602 Pain in left arm: Secondary | ICD-10-CM

## 2021-10-30 ENCOUNTER — Other Ambulatory Visit: Payer: Self-pay | Admitting: Nurse Practitioner

## 2021-11-05 ENCOUNTER — Telehealth: Payer: Self-pay | Admitting: Family Medicine

## 2021-11-05 NOTE — Telephone Encounter (Signed)
Amy is leaving early this Thursday. I called and lvm for patient to call us back to reschedule. He can been seen earlier that same day. There are slots held.

## 2021-11-06 NOTE — Patient Instructions (Signed)
Below is our plan: ? ?We will continue Tecfidera. I will update labs today. Continue lamotrigine 100mg  twice daily for seizure management. Continue close follow up with for mood management.  ? ?Please make sure you are staying well hydrated. I recommend 50-60 ounces daily. Well balanced diet and regular exercise encouraged. Consistent sleep schedule with 6-8 hours recommended.  ? ?Please continue follow up with care team as directed.  ? ?Follow up with Dr [name] in 6 months  ? ?You may receive a survey regarding today's visit. I encourage you to leave honest feed back as I do use this information to improve patient care. Thank you for seeing me today!  ? ? ?

## 2021-11-06 NOTE — Progress Notes (Signed)
Chief Complaint  Patient presents with   Follow-up    [name] with mom; here for 6 month f/u- reports he has been doing well since last visit.     HISTORY OF PRESENT ILLNESS:  11/07/21 ALL:  Micheal Webb is a 46 y.o. male here today for follow up for RRMS and seizures. He continues dimethyl fumarate 240 mg BID and is doing well from an MS standpoint.   He has had one seizure since being seen in 05/2021. Last seizure was 05/20/2021. Events were not witnessed but noted by his mother who was on the phone with him. He is confused and sore afterwards. Dr Epimenio Foot increased lamotrigine to 100mg  BID. No seizure activity since dose change. He is also on divalproex ER 1500mg  QHS, clonazepam 1mg  BID and risperidone 2mg  BID for mood management followed by [name]. He has follow up with her next week.   He denies new or exacerbating symptoms. No changes in gait, bowel, or bladder function. No vision changes. He is sleeping and eating well.    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  He has not had any new issues of slurred speech, numbness, weakness or other neurologic symptoms.   Gait is off balanced and his mom notes it is worse than last year.    Due to balance he can't ride a bike.      Strength and sensation are fine.   Bladder function is fine.   Vision is fine.       He has some fatigue.    He sleeps well most nights.     He has no recent seizures (GTC followed by confusion) last one earlier this year.   The lamotrigine dose was increased and he has done better.  [name]    He is on Depakote 1500 mg nightly, lamotrigine 75 mg po bid and clonazepam 1 mg po bid.     He has schizophrenia and sees [name] ([facility]).       MS History: He presented to the [facility] emergency department 12/19/2019 age 89. At that time he had  slurred speech, gait ataxia and lethargy. He also felt more confused.  An MRI of the brain was performed showing the multiple lesions including some that enhanced, in a pattern and distribution consistent with multiple sclerosis. We do not have an older MRI for comparison but a CT scan done before the MRI shows foci that were not present on a CT scan done several years ago. He was admitted to the hospital but left AMA the next day. He did receive at least a couple doses of IV Solu-Medrol and he felt his symptoms improved.       MRI of the brain 4/13/2021shows multiple T2/FLAIR hyperintense foci in the periventricular, juxtacortical and deep white matter of the hemispheres and a few foci in the brainstem. Some periventricular foci are radially oriented to the ventricles. About 8 of the foci in the hemispheres enhance after contrast. The enhancement is when shaped in general.  REVIEW OF SYSTEMS: Out of a complete 14 system review of symptoms, the patient complains only of the following symptoms, seizures and all other reviewed systems are negative.    ALLERGIES: No Known Allergies   HOME MEDICATIONS: Outpatient Medications Prior to Visit  Medication Sig Dispense Refill  benztropine (COGENTIN) 1 MG tablet Take 1 mg by mouth See admin instructions. 1 tablet in the morning and 2 tablets at bedtime.     Cholecalciferol (VITAMIN D) 125 MCG (5000 UT) CAPS Take 125 mcg by mouth daily.      clonazePAM (KLONOPIN) 1 MG tablet Take 1 mg by mouth in the morning and at bedtime.      Dimethyl Fumarate (TECFIDERA) 240 MG CPDR Take 1 capsule by mouth twice per day 60 capsule 11   divalproex (DEPAKOTE ER) 500 MG 24 hr tablet 1,500 mg at bedtime.   5   lamoTRIgine (LAMICTAL) 100 MG tablet Take 1 tablet (100 mg total) by mouth 2 (two) times daily. 60 tablet 5   meloxicam (MOBIC) 15 MG tablet TAKE ONE TABLET BY MOUTH DAILY 90 tablet 4   pravastatin (PRAVACHOL) 10 MG tablet TAKE ONE TABLET BY MOUTH AT BEDTIME 90  tablet 11   risperiDONE (RISPERDAL) 2 MG tablet Take 2 mg by mouth 2 (two) times daily.      No facility-administered medications prior to visit.     PAST MEDICAL HISTORY: Past Medical History:  Diagnosis Date   Carpal tunnel syndrome on left    MS (multiple sclerosis) (HCC)    Schizophrenia (HCC)    Seizure (HCC)      PAST SURGICAL HISTORY: Past Surgical History:  Procedure Laterality Date   None       FAMILY HISTORY: Family History  Problem Relation Age of Onset   Breast cancer Mother    Diverticulitis Mother        had surgery and colostomy bag 11/2015   Colon cancer Paternal Grandfather    Pancreatic cancer Maternal Grandmother      SOCIAL HISTORY: Social History   Socioeconomic History   Marital status: Single    Spouse name: Not on file   Number of children: 0   Years of education: 8   Highest education level: Not on file  Occupational History   Occupation: Disabled   Tobacco Use   Smoking status: Every Day    Packs/day: 1.00    Years: 29.00    Pack years: 29.00    Types: Cigarettes   Smokeless tobacco: Never  Substance and Sexual Activity   Alcohol use: Yes    Comment: sometimes    Drug use: No    Comment: Denies   Sexual activity: Not on file  Other Topics Concern   Not on file  Social History Narrative   Patient lives alone.    Patient is on disability.   Patient is single.    Caffeine use: coffee    Social Determinants of Corporate investment banker Strain: Not on file  Food Insecurity: Not on file  Transportation Needs: Not on file  Physical Activity: Not on file  Stress: Not on file  Social Connections: Not on file  Intimate Partner Violence: Not on file      PHYSICAL EXAM  Vitals:   11/07/21 1035  BP: 102/64  Pulse: 79  Weight: 63.5 kg  Height: 5\' 9"  (1.753 m)    Body mass index is 20.67 kg/m.   Generalized: Well developed, in no acute distress  Cardiology: normal rate and rhythm, no murmur auscultated   Respiratory: clear to auscultation bilaterally    Neurological examination  Mentation: Alert oriented to time, place, history taking. Follows all commands speech and language fluent Cranial nerve II-XII: Pupils were equal round reactive to light. Extraocular movements were full, visual field  were full on confrontational test. Facial sensation and strength were normal. Head turning and shoulder shrug  were normal and symmetric. Motor: The motor testing reveals 5 over 5 strength of all 4 extremities. Good symmetric motor tone is noted throughout.  Sensory: Sensory testing is intact to soft touch on all 4 extremities. No evidence of extinction is noted.  Coordination: Cerebellar testing reveals good finger-nose-finger and heel-to-shin bilaterally.  Gait and station: Gait is mildly wide. Stale without assistive device. Unable to tandem.   Reflexes: Deep tendon reflexes are symmetric and normal bilaterally.     DIAGNOSTIC DATA (LABS, IMAGING, TESTING) - I reviewed patient records, labs, notes, testing and imaging myself where available.  Lab Results  Component Value Date   WBC 10.5 06/17/2021   HGB 13.4 06/17/2021   HCT 37.0 (L) 06/17/2021   MCV 92 06/17/2021   PLT 227 06/17/2021      Component Value Date/Time   NA 137 06/17/2021 1541   K 4.6 06/17/2021 1541   CL 97 06/17/2021 1541   CO2 26 06/17/2021 1541   GLUCOSE 70 06/17/2021 1541   GLUCOSE 167 (H) 12/22/2019 1239   BUN 10 06/17/2021 1541   CREATININE 0.60 (L) 06/17/2021 1541   CALCIUM 9.8 06/17/2021 1541   PROT 6.6 06/17/2021 1541   ALBUMIN 4.7 06/17/2021 1541   AST 13 06/17/2021 1541   ALT 10 06/17/2021 1541   ALKPHOS 75 06/17/2021 1541   BILITOT <0.2 06/17/2021 1541   GFRNONAA 111 06/11/2020 1544   GFRAA 129 06/11/2020 1544   Lab Results  Component Value Date   CHOL 160 06/17/2021   HDL 47 06/17/2021   LDLCALC 102 (H) 06/17/2021   TRIG 54 06/17/2021   CHOLHDL 3.4 06/17/2021   No results found for: HGBA1C Lab  Results  Component Value Date   VITAMINB12 573 12/10/2020   Lab Results  Component Value Date   TSH 1.580 12/10/2020    No flowsheet data found.   No flowsheet data found.   ASSESSMENT AND PLAN  46 y.o. year old male  has a past medical history of Carpal tunnel syndrome on left, MS (multiple sclerosis) (HCC), Schizophrenia (HCC), and Seizure (HCC). here with   Multiple sclerosis (HCC) - Plan: CBC with Differential/Platelets, Hepatic Function Panel  Generalized convulsive epilepsy (HCC) - Plan: CBC with Differential/Platelets, Lamotrigine level, Hepatic Function Panel  High risk medication use - Plan: CBC with Differential/Platelets, Hepatic Function Panel  Schizophrenia, unspecified type (HCC)  Romaldo is doing well from an MS standpoint. We will continue Tecfidera (Dimethyl Fumarate). I will update labs today. He will continue lamotrigine 100mg  BID for seizure management. Hee will continue divalproex ER, clonazepam and risperidone as prescribed by psychiatry. He is aware to monitor for worsening tremor or mood. He was encouraged to stay well hydrated. Seizure precautions reviewed. He does not drive. Medications are put in pill container by mom. He will follow up with in 6 months.    Orders Placed This Encounter  Procedures   CBC with Differential/Platelets   Lamotrigine level   Hepatic Function Panel     No orders of the defined types were placed in this encounter.    [place], MSN, FNP-C 11/07/2021, 11:07 AM  [facility] [address], [address] [place], [place] [phone]

## 2021-11-07 ENCOUNTER — Ambulatory Visit: Payer: Medicaid Other | Admitting: Family Medicine

## 2021-11-07 ENCOUNTER — Encounter: Payer: Self-pay | Admitting: Family Medicine

## 2021-11-07 VITALS — BP 102/64 | HR 79 | Ht 69.0 in | Wt 140.0 lb

## 2021-11-07 DIAGNOSIS — F209 Schizophrenia, unspecified: Secondary | ICD-10-CM

## 2021-11-07 DIAGNOSIS — Z79899 Other long term (current) drug therapy: Secondary | ICD-10-CM

## 2021-11-07 DIAGNOSIS — G40309 Generalized idiopathic epilepsy and epileptic syndromes, not intractable, without status epilepticus: Secondary | ICD-10-CM | POA: Diagnosis not present

## 2021-11-07 DIAGNOSIS — G35 Multiple sclerosis: Secondary | ICD-10-CM | POA: Diagnosis not present

## 2021-11-08 LAB — CBC WITH DIFFERENTIAL/PLATELET
Basophils Absolute: 0.1 10*3/uL (ref 0.0–0.2)
Basos: 1 %
EOS (ABSOLUTE): 0.2 10*3/uL (ref 0.0–0.4)
Eos: 2 %
Hematocrit: 41.6 % (ref 37.5–51.0)
Hemoglobin: 15 g/dL (ref 13.0–17.7)
Immature Grans (Abs): 0.1 10*3/uL (ref 0.0–0.1)
Immature Granulocytes: 1 %
Lymphocytes Absolute: 2.4 10*3/uL (ref 0.7–3.1)
Lymphs: 28 %
MCH: 33 pg (ref 26.6–33.0)
MCHC: 36.1 g/dL — ABNORMAL HIGH (ref 31.5–35.7)
MCV: 92 fL (ref 79–97)
Monocytes Absolute: 0.7 10*3/uL (ref 0.1–0.9)
Monocytes: 8 %
Neutrophils Absolute: 5.1 10*3/uL (ref 1.4–7.0)
Neutrophils: 60 %
Platelets: 258 10*3/uL (ref 150–450)
RBC: 4.54 x10E6/uL (ref 4.14–5.80)
RDW: 12.4 % (ref 11.6–15.4)
WBC: 8.5 10*3/uL (ref 3.4–10.8)

## 2021-11-08 LAB — HEPATIC FUNCTION PANEL
ALT: 12 IU/L (ref 0–44)
AST: 19 IU/L (ref 0–40)
Albumin: 4.8 g/dL (ref 4.0–5.0)
Alkaline Phosphatase: 92 IU/L (ref 44–121)
Bilirubin Total: 0.4 mg/dL (ref 0.0–1.2)
Bilirubin, Direct: 0.11 mg/dL (ref 0.00–0.40)
Total Protein: 7.3 g/dL (ref 6.0–8.5)

## 2021-11-08 LAB — LAMOTRIGINE LEVEL: Lamotrigine Lvl: 8 ug/mL (ref 2.0–20.0)

## 2021-12-02 ENCOUNTER — Telehealth: Payer: Self-pay | Admitting: Family Medicine

## 2021-12-02 ENCOUNTER — Other Ambulatory Visit: Payer: Self-pay | Admitting: Neurology

## 2021-12-02 MED ORDER — LAMOTRIGINE 100 MG PO TABS: 100.0000 mg | ORAL_TABLET | Freq: Two times a day (BID) | ORAL | 5 refills | Status: DC

## 2021-12-02 NOTE — Telephone Encounter (Signed)
This is our first time getting refill request for the pt. I will [facility] the refill to [facility] as previously was sent there. ?

## 2021-12-02 NOTE — Telephone Encounter (Signed)
Call the patients mom back and advised the script has been forwarded to the pharmacy for the pt. Advised that this was our first time receiving a request for a refill. Pt verbalized understanding. ?Pt had no questions at this time but was encouraged to call back if questions arise. ? ?

## 2021-12-02 NOTE — Telephone Encounter (Signed)
Pt's mother, Therisa Doyne inquiring on refill for lamoTRIgine (LAMICTAL) 100 MG tablet.  ?Informed pt refill sitting in Dr. Dickie La. Check with the pharmacy by the end of the day. Have been trying to get a refill for 2 months. Would like a call from the  nurse. ? ? ?

## 2021-12-03 IMAGING — MR MR HEAD WO/W CM
12 of 14 series · 40 of 48 positions shown · IV contrast (gadavist)
Comparison: None.

CLINICAL DATA: Encephalopathy.

EXAM:
MRI HEAD WITHOUT AND WITH CONTRAST
TECHNIQUE: Multiplanar, multiecho pulse sequences of the brain and surrounding
structures were obtained without and with intravenous contrast.
CONTRAST:  6mL GADAVIST GADOBUTROL 1 MMOL/ML IV SOLN

[Series 5: DWI · axial · 3.0mm · 0.88mm/px · z∈[-57,+87]mm · 8 of 98 slices shown (1 of 4)]
[im 1/98]
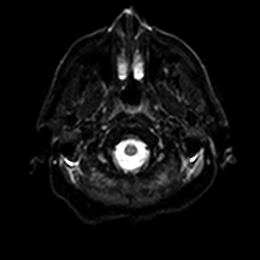
[im 14/98]
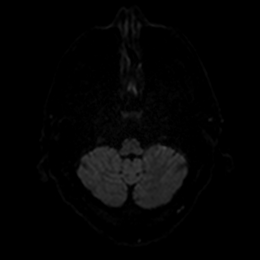
[im 28/98]
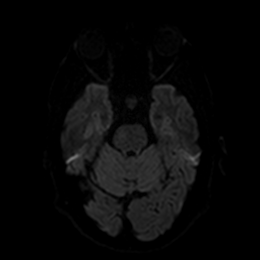
[im 42/98]
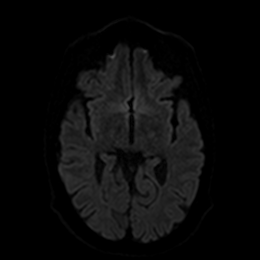
[im 56/98]
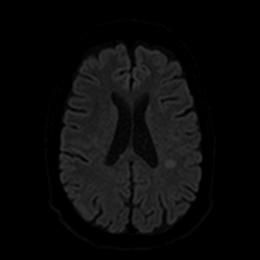
[im 70/98]
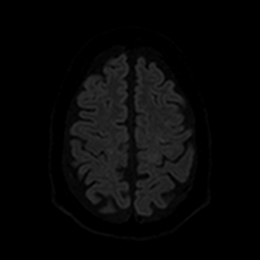
[im 84/98]
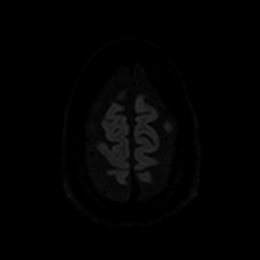
[im 98/98]
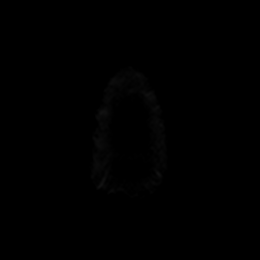

[Series 6: DWI · axial · 3.0mm · 0.88mm/px · z∈[-57,+87]mm · 4 of 47 slices shown (2 of 4)]
[im 1/47]
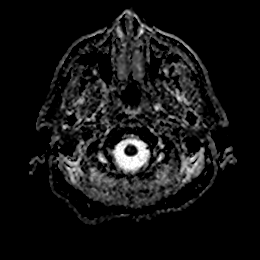
[im 16/47]
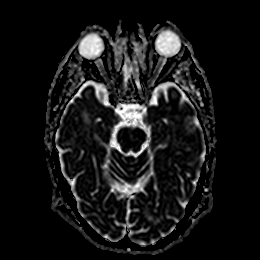
[im 31/47]
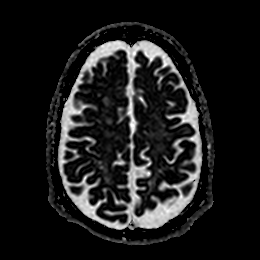
[im 47/47]
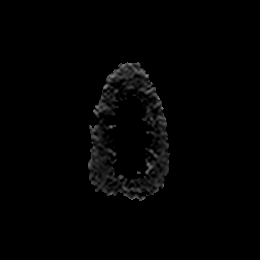

[Series 7: DWI · coronal · 4.0mm · 0.88mm/px · 5 of 74 slices shown (3 of 4)]
[im 1/74]
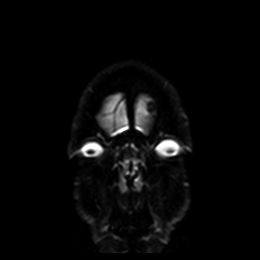
[im 19/74]
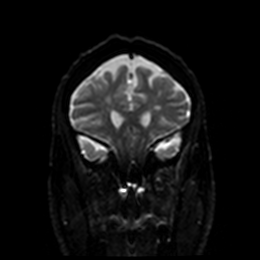
[im 37/74]
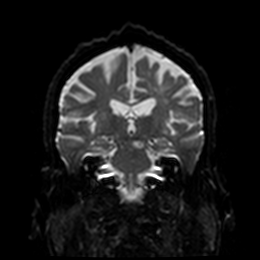
[im 55/74]
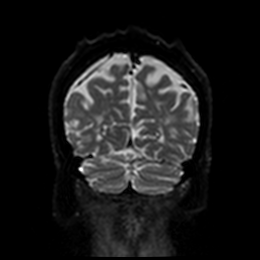
[im 74/74]
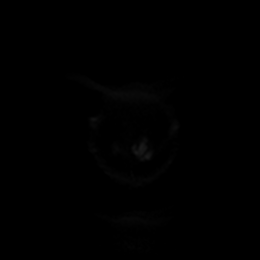

[Series 8: DWI · coronal · 4.0mm · 0.88mm/px · 3 of 36 slices shown (4 of 4)]
[im 1/36]
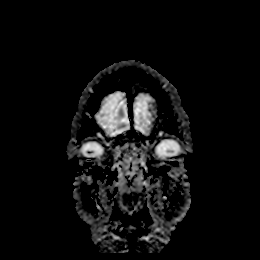
[im 18/36]
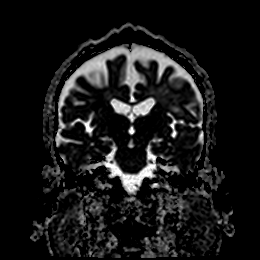
[im 36/36]
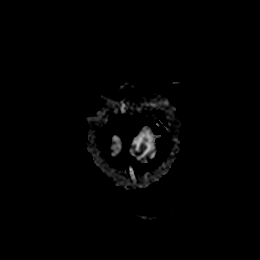

[Series 9: T1 · sagittal · 5.0mm · 0.75mm/px · 2 of 25 slices shown]
[im 1/25]
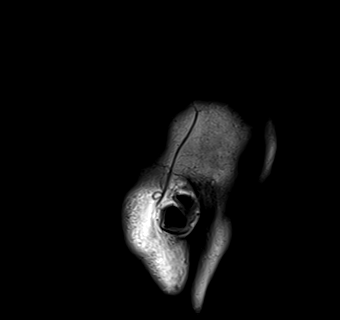
[im 25/25]
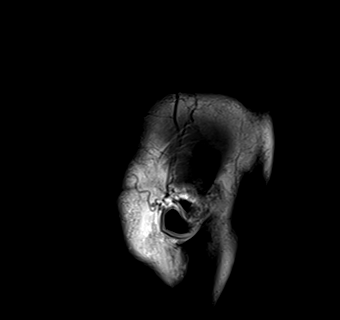

[Series 10: T2 · axial · 5.0mm · 0.72mm/px · z∈[-56,+88]mm · 2 of 25 slices shown]
[im 1/25]
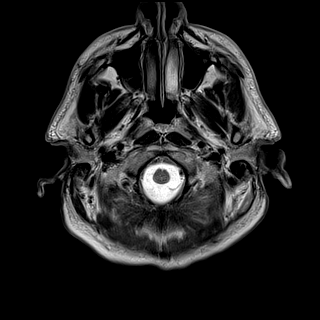
[im 25/25]
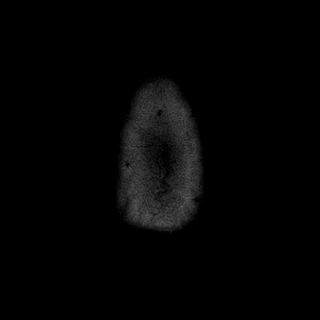

[Series 11: FLAIR · axial · 5.0mm · 0.45mm/px · z∈[-56,+88]mm · 2 of 25 slices shown]
[im 1/25]
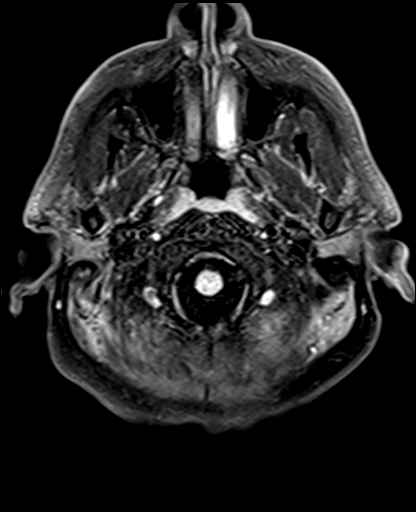
[im 25/25]
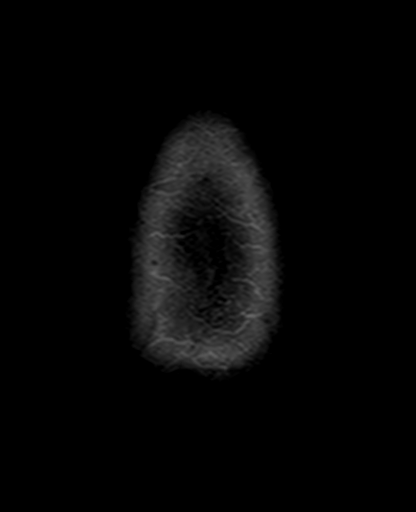

[Series 13: pha_images · axial · 3.0mm · 0.94mm/px · z∈[-72,+105]mm · 4 of 57 slices shown]
[im 1/57]
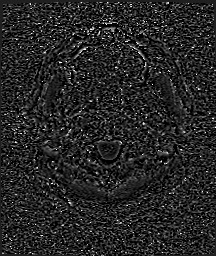
[im 19/57]
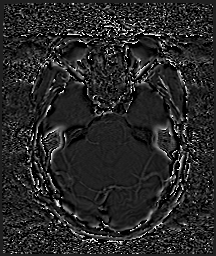
[im 38/57]
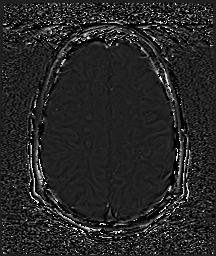
[im 57/57]
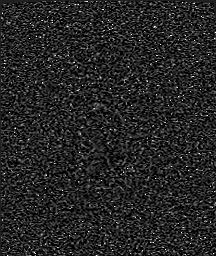

[Series 14: swi_images · axial · 3.0mm · 0.94mm/px · z∈[-72,+105]mm · 4 of 60 slices shown]
[im 1/60]
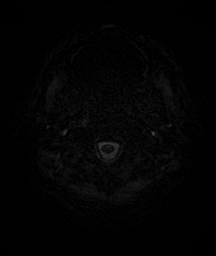
[im 20/60]
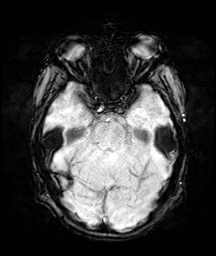
[im 40/60]
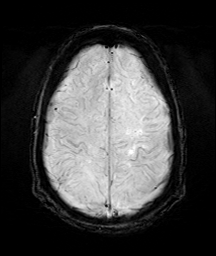
[im 60/60]
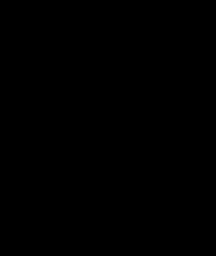

[Series 17: T2 post-contrast · coronal · 5.0mm · 0.72mm/px · 2 of 28 slices shown]
[im 1/28]
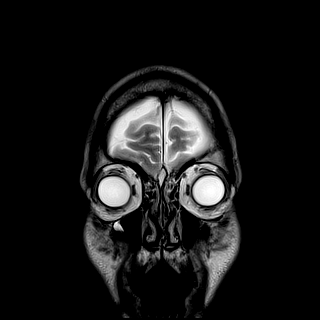
[im 28/28]
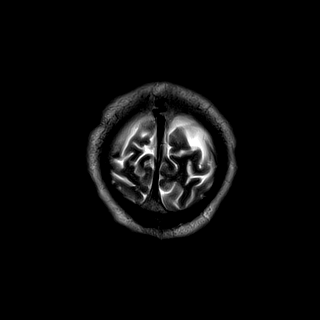

[Series 19: T1 post-contrast · coronal · 5.0mm · 0.34mm/px · 2 of 28 slices shown (1 of 2)]
[im 1/28]
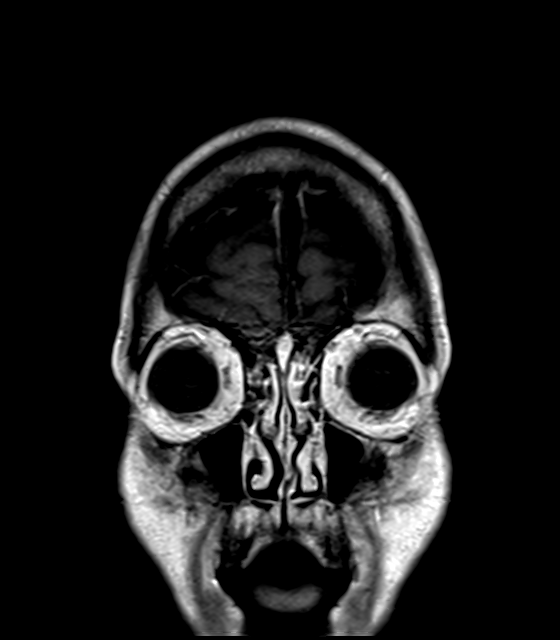
[im 28/28]
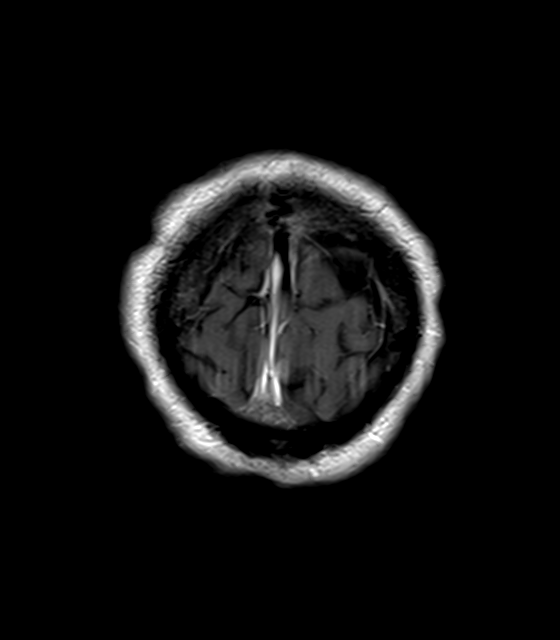

[Series 20: T1 post-contrast · sagittal · 5.0mm · 0.72mm/px · 2 of 25 slices shown (2 of 2)]
[im 1/25]
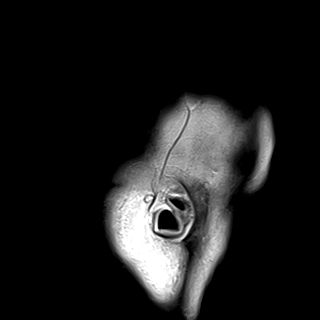
[im 25/25]
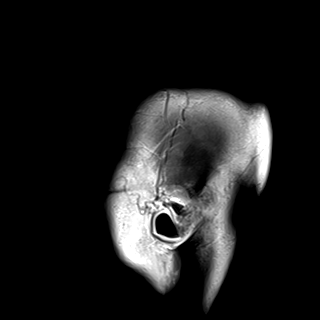

[40 of 48 positions shown; findings below may reference images not displayed]

FINDINGS: BRAIN: No acute infarct, acute hemorrhage or extra-axial collection.
There is severe white matter disease with many lesions oriented
perpendicularly to the long axis of the lateral ventricles. There
are multiple contrast-enhancing lesions, most of which have a ring
shaped morphology. These lesions are located at the left cerebral
peduncle, right temporal root and multiple locations in the left
hemispheric white matter. There are proximally 8 enhancing lesions.
Normal volume of brain parenchyma and CSF spaces. There is at least
2 infratentorial lesions. There is no abnormal diffusion restriction
associated with the lesions. The enhancing lesions actually show
facilitated diffusion.

VASCULAR: Major flow voids are preserved. Susceptibility-sensitive
sequences show no chronic microhemorrhage or superficial siderosis.

SKULL AND UPPER CERVICAL SPINE: Normal calvarium and skull base.
Visualized upper cervical spine and soft tissues are normal.

SINUSES/ORBITS: No paranasal sinus fluid levels or advanced mucosal
thickening. No mastoid or middle ear effusion. Normal orbits.
IMPRESSION: 1. Numerous supratentorial and infratentorial white matter lesions
in a pattern most consistent with demyelinating disease. An
infectious or inflammatory encephalitis is considered less likely.
2. No acute ischemia.

## 2022-05-15 ENCOUNTER — Encounter: Payer: Self-pay | Admitting: Neurology

## 2022-05-15 ENCOUNTER — Telehealth: Payer: Self-pay | Admitting: Neurology

## 2022-05-15 ENCOUNTER — Ambulatory Visit (INDEPENDENT_AMBULATORY_CARE_PROVIDER_SITE_OTHER): Payer: Medicaid Other | Admitting: Neurology

## 2022-05-15 VITALS — BP 110/70 | HR 76 | Ht 69.0 in | Wt 140.5 lb

## 2022-05-15 DIAGNOSIS — G40309 Generalized idiopathic epilepsy and epileptic syndromes, not intractable, without status epilepticus: Secondary | ICD-10-CM

## 2022-05-15 DIAGNOSIS — Z79899 Other long term (current) drug therapy: Secondary | ICD-10-CM

## 2022-05-15 DIAGNOSIS — F209 Schizophrenia, unspecified: Secondary | ICD-10-CM | POA: Diagnosis not present

## 2022-05-15 DIAGNOSIS — G35 Multiple sclerosis: Secondary | ICD-10-CM | POA: Diagnosis not present

## 2022-05-15 NOTE — Progress Notes (Signed)
[facility]  PATIENT: Micheal Webb DOB: 1976-08-25  REFERRING DOCTOR OR PCP:  [name], FNP SOURCE: patient, notes from ED, lab and imaging reports, images personally reviewed.   _________________________________   HISTORICAL  CHIEF COMPLAINT:  Chief Complaint  Patient presents with   Follow-up    RM 2. MS DMT: dimethyl fumarate. Last seen 11/07/21.     HISTORY OF PRESENT ILLNESS:  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 He denies flushing or GI issues.      He feels gait is stable.   He is sometimes mildly off balanced.  No falls.   He denies leg or arm weakness.   He denies numbness or tingling now but has had some in past (occ will get numbness if sits a long time but quickly better with moving).     Vision is doing well.  Bladder function is fine.      He has some fatigue.    He sleeps well most nights.    He has schizophrenia and sees [name] ([facility]).    He has no recent seizures (GTC followed by confusion) last one earlier this year.   The lamotrigine dose was increased and he has done better.  [name]    He is on Depakote 1500 mg nightly, lamotrigine 75 mg po bid and clonazepam 1 mg po bid.    MS History: He presented to the [facility] emergency department 12/19/2019 age 59. At that time he had slurred speech, gait ataxia and lethargy. He also felt more confused.  An MRI of the brain was performed showing the multiple lesions including some that enhanced, in a pattern and distribution consistent with multiple sclerosis. We do not have an older MRI for comparison but a CT scan done before the MRI shows foci that were not present on a CT scan done several years ago. He was admitted to the hospital but left AMA the next day. He did receive at least a couple doses of IV Solu-Medrol and he felt his symptoms improved.      MRI of the  brain 4/13/2021shows multiple T2/FLAIR hyperintense foci in the periventricular, juxtacortical and deep white matter of the hemispheres and a few foci in the brainstem. Some periventricular foci are radially oriented to the ventricles. About 8 of the foci in the hemispheres enhance after contrast. The enhancement is when shaped in general.    REVIEW OF SYSTEMS: Constitutional: No fevers, chills, sweats, or change in appetite.  Sleeps well but irregular hours Eyes: No visual changes, double vision, eye pain Ear, nose and throat: No hearing loss, ear pain, nasal congestion, sore throat Cardiovascular: No chest pain, palpitations Respiratory:  No shortness of breath at rest or with exertion.   No wheezes GastrointestinaI: No nausea, vomiting, diarrhea, abdominal pain, fecal incontinence Genitourinary:  No dysuria, urinary retention or frequency.  No nocturia. Musculoskeletal:  notes neck pain and sometimes arm pain Integumentary: No rash, pruritus, skin lesions Neurological: as above Psychiatric: see above Endocrine: No palpitations, diaphoresis, change in appetite, change in weigh or increased thirst Hematologic/Lymphatic:  No anemia, purpura, petechiae. Allergic/Immunologic: No itchy/runny eyes, nasal congestion, recent allergic reactions, rashes  ALLERGIES: No Known Allergies  HOME MEDICATIONS:  Current Outpatient Medications:    benztropine (COGENTIN) 1 MG tablet, Take 1 mg by mouth See admin instructions. 1 tablet in the morning and 2 tablets  at bedtime., Disp: , Rfl:    Cholecalciferol (VITAMIN D) 125 MCG (5000 UT) CAPS, Take 125 mcg by mouth daily. , Disp: , Rfl:    clonazePAM (KLONOPIN) 1 MG tablet, Take 1 mg by mouth in the morning and at bedtime. , Disp: , Rfl:    Dimethyl Fumarate (TECFIDERA) 240 MG CPDR, Take 1 capsule by mouth twice per day, Disp: 60 capsule, Rfl: 11   divalproex (DEPAKOTE ER) 500 MG 24 hr tablet, 1,500 mg at bedtime. , Disp: , Rfl: 5   lamoTRIgine (LAMICTAL)  100 MG tablet, Take 1 tablet (100 mg total) by mouth 2 (two) times daily., Disp: 60 tablet, Rfl: 5   meloxicam (MOBIC) 15 MG tablet, TAKE ONE TABLET BY MOUTH DAILY, Disp: 90 tablet, Rfl: 4   pravastatin (PRAVACHOL) 10 MG tablet, TAKE ONE TABLET BY MOUTH AT BEDTIME, Disp: 90 tablet, Rfl: 11   risperiDONE (RISPERDAL) 2 MG tablet, Take 2 mg by mouth 2 (two) times daily. , Disp: , Rfl:   PAST MEDICAL HISTORY: Past Medical History:  Diagnosis Date   Carpal tunnel syndrome on left    MS (multiple sclerosis) (HCC)    Schizophrenia (HCC)    Seizure (HCC)     PAST SURGICAL HISTORY: Past Surgical History:  Procedure Laterality Date   None      FAMILY HISTORY: Family History  Problem Relation Age of Onset   Breast cancer Mother    Diverticulitis Mother        had surgery and colostomy bag 11/2015   Colon cancer Paternal Grandfather    Pancreatic cancer Maternal Grandmother     SOCIAL HISTORY:  Social History   Socioeconomic History   Marital status: Single    Spouse name: Not on file   Number of children: 0   Years of education: 8   Highest education level: Not on file  Occupational History   Occupation: Disabled   Tobacco Use   Smoking status: Every Day    Packs/day: 1.00    Years: 29.00    Total pack years: 29.00    Types: Cigarettes   Smokeless tobacco: Never  Substance and Sexual Activity   Alcohol use: Yes    Comment: sometimes    Drug use: No    Comment: Denies   Sexual activity: Not on file  Other Topics Concern   Not on file  Social History Narrative   Patient lives alone.    Patient is on disability.   Patient is single.    Caffeine use: coffee    Social Determinants of Corporate investment banker Strain: Not on file  Food Insecurity: Not on file  Transportation Needs: Not on file  Physical Activity: Not on file  Stress: Not on file  Social Connections: Not on file  Intimate Partner Violence: Not on file     PHYSICAL EXAM  Vitals:   05/15/22  1519  BP: 110/70  Pulse: 76  SpO2: 98%  Weight: 140 lb 8 oz (63.7 kg)  Height: 5\' 9"  (1.753 m)    Body mass index is 20.75 kg/m.  No results found.  General: The patient is well-developed and well-nourished and in no acute distress.  Neck with good ROM   Skin: Extremities are without rash or  edema.   Neurologic Exam  Mental status: The patient is alert and oriented x 3 at the time of the examination. The patient has apparent normal recent and remote memory, with an apparently normal attention span and concentration  ability.   Speech is normal.  Cranial nerves: Extraocular movements are full.   He states acuity is symmetric color vision is reduced on the right.  Facial symmetry is present. There is good facial sensation to soft touch bilaterally.Facial strength is normal.  Trapezius and sternocleidomastoid strength is normal. No dysarthria is noted.  No obvious hearing deficits are noted.  Motor:  Muscle bulk is normal.   Tone is normal. Strength is  5 / 5 in all 4 extremities.   Sensory: Sensory testing is intact to pinprick, soft touch and vibration sensation in all 4 extremities.  Coordination: Cerebellar testing reveals good finger-nose-finger and heel-to-shin bilaterally.  Gait and station: Station is normal.   Gait is slightly wide.   His tandem gait is wide.  Romberg is negative.   Reflexes: Deep tendon reflexes are symmetric and normal bilaterally.         DIAGNOSTIC DATA (LABS, IMAGING, TESTING) - I reviewed patient records, labs, notes, testing and imaging myself where available.  Lab Results  Component Value Date   WBC 8.5 11/07/2021   HGB 15.0 11/07/2021   HCT 41.6 11/07/2021   MCV 92 11/07/2021   PLT 258 11/07/2021      Component Value Date/Time   NA 137 06/17/2021 1541   K 4.6 06/17/2021 1541   CL 97 06/17/2021 1541   CO2 26 06/17/2021 1541   GLUCOSE 70 06/17/2021 1541   GLUCOSE 167 (H) 12/22/2019 1239   BUN 10 06/17/2021 1541   CREATININE 0.60  (L) 06/17/2021 1541   CALCIUM 9.8 06/17/2021 1541   PROT 7.3 11/07/2021 1110   ALBUMIN 4.8 11/07/2021 1110   AST 19 11/07/2021 1110   ALT 12 11/07/2021 1110   ALKPHOS 92 11/07/2021 1110   BILITOT 0.4 11/07/2021 1110   GFRNONAA 111 06/11/2020 1544   GFRAA 129 06/11/2020 1544   Lab Results  Component Value Date   CHOL 160 06/17/2021   HDL 47 06/17/2021   LDLCALC 102 (H) 06/17/2021   TRIG 54 06/17/2021   CHOLHDL 3.4 06/17/2021       ASSESSMENT AND PLAN  Multiple sclerosis (HCC) - Plan: MR BRAIN W WO CONTRAST, CBC with Differential/Platelet, Comprehensive metabolic panel, Valproic acid level, Lamotrigine level  High risk medication use - Plan: MR BRAIN W WO CONTRAST, CBC with Differential/Platelet, Comprehensive metabolic panel, Valproic acid level, Lamotrigine level  Generalized convulsive epilepsy (HCC) - Plan: CBC with Differential/Platelet, Comprehensive metabolic panel, Valproic acid level, Lamotrigine level  Schizophrenia, unspecified type (HCC)   1.  He will continue dimethyl fumarate 240 mg p.o. twice daily.  We will check CBC with differential and CMP.   Check MRI to determine if any subclinical progression .   If occurring we can switch to a more efficacious medication.   2.  Continue Depakote and lamotrigine at current dose.  Check VPA and lamotrigine  level 3.  Stay active and exercise as tolerated. 4.   rtc 6 months or sooner if new or worsening neurologic issues.  Send copy of labs to [name], [place] [facility] (Dr. Betti Cruz)  Harrell Niehoff A. Epimenio Foot, MD, [facility] 05/15/2022, 3:57 PM Certified in Neurology, Clinical Neurophysiology, Sleep Medicine and Neuroimaging  [facility] [address] [place], [place] [address] [phone]

## 2022-05-15 NOTE — Telephone Encounter (Signed)
[place] medicaid NPR sent to GI 

## 2022-05-16 ENCOUNTER — Telehealth: Payer: Self-pay | Admitting: Nurse Practitioner

## 2022-05-16 LAB — COMPREHENSIVE METABOLIC PANEL
ALT: 17 IU/L (ref 0–44)
AST: 20 IU/L (ref 0–40)
Albumin/Globulin Ratio: 2.2 (ref 1.2–2.2)
Albumin: 5 g/dL (ref 4.1–5.1)
Alkaline Phosphatase: 92 IU/L (ref 44–121)
BUN/Creatinine Ratio: 8 — ABNORMAL LOW (ref 9–20)
BUN: 6 mg/dL (ref 6–24)
Bilirubin Total: 0.4 mg/dL (ref 0.0–1.2)
CO2: 23 mmol/L (ref 20–29)
Calcium: 10 mg/dL (ref 8.7–10.2)
Chloride: 97 mmol/L (ref 96–106)
Creatinine, Ser: 0.71 mg/dL — ABNORMAL LOW (ref 0.76–1.27)
Globulin, Total: 2.3 g/dL (ref 1.5–4.5)
Glucose: 80 mg/dL (ref 70–99)
Potassium: 4.7 mmol/L (ref 3.5–5.2)
Sodium: 136 mmol/L (ref 134–144)
Total Protein: 7.3 g/dL (ref 6.0–8.5)
eGFR: 113 mL/min/{1.73_m2} (ref >59)

## 2022-05-16 LAB — CBC WITH DIFFERENTIAL/PLATELET
Basophils Absolute: 0.1 10*3/uL (ref 0.0–0.2)
Basos: 0 %
EOS (ABSOLUTE): 0.1 10*3/uL (ref 0.0–0.4)
Eos: 1 %
Hematocrit: 43.6 % (ref 37.5–51.0)
Hemoglobin: 15.7 g/dL (ref 13.0–17.7)
Immature Grans (Abs): 0.2 10*3/uL — ABNORMAL HIGH (ref 0.0–0.1)
Immature Granulocytes: 1 %
Lymphocytes Absolute: 2.5 10*3/uL (ref 0.7–3.1)
Lymphs: 16 %
MCH: 33.3 pg — ABNORMAL HIGH (ref 26.6–33.0)
MCHC: 36 g/dL — ABNORMAL HIGH (ref 31.5–35.7)
MCV: 93 fL (ref 79–97)
Monocytes Absolute: 0.9 10*3/uL (ref 0.1–0.9)
Monocytes: 6 %
Neutrophils Absolute: 12.2 10*3/uL — ABNORMAL HIGH (ref 1.4–7.0)
Neutrophils: 76 %
Platelets: 266 10*3/uL (ref 150–450)
RBC: 4.71 x10E6/uL (ref 4.14–5.80)
RDW: 12.9 % (ref 11.6–15.4)
WBC: 15.9 10*3/uL — ABNORMAL HIGH (ref 3.4–10.8)

## 2022-05-16 LAB — VALPROIC ACID LEVEL: Valproic Acid Lvl: 31 ug/mL — ABNORMAL LOW (ref 50–100)

## 2022-05-16 LAB — LAMOTRIGINE LEVEL: Lamotrigine Lvl: 6.3 ug/mL (ref 2.0–20.0)

## 2022-05-16 NOTE — Telephone Encounter (Signed)
Called pt to confirm appt no answer

## 2022-05-26 ENCOUNTER — Other Ambulatory Visit: Payer: Self-pay | Admitting: Neurology

## 2022-05-26 DIAGNOSIS — G35 Multiple sclerosis: Secondary | ICD-10-CM

## 2022-05-27 ENCOUNTER — Telehealth: Payer: Self-pay | Admitting: Neurology

## 2022-05-27 MED ORDER — LAMOTRIGINE 100 MG PO TABS: 100.0000 mg | ORAL_TABLET | Freq: Two times a day (BID) | ORAL | 5 refills | Status: DC

## 2022-05-27 NOTE — Telephone Encounter (Signed)
E-scribed refill 

## 2022-05-27 NOTE — Telephone Encounter (Signed)
Pt is needing a refill request for his lamoTRIgine (LAMICTAL) 100 MG tablet sent in to the [facility]

## 2022-06-01 ENCOUNTER — Other Ambulatory Visit: Payer: Medicaid Other

## 2022-06-11 ENCOUNTER — Ambulatory Visit
Admission: RE | Admit: 2022-06-11 | Discharge: 2022-06-11 | Disposition: A | Discharge status: Home / Self Care | Payer: Medicaid Other | Source: Ambulatory Visit | Attending: Neurology | Admitting: Neurology

## 2022-06-11 DIAGNOSIS — Z79899 Other long term (current) drug therapy: Secondary | ICD-10-CM

## 2022-06-11 DIAGNOSIS — G35 Multiple sclerosis: Secondary | ICD-10-CM

## 2022-06-11 MED ORDER — GADOBENATE DIMEGLUMINE 529 MG/ML IV SOLN
13.0000 mL | Freq: Once | INTRAVENOUS | Status: AC | PRN
  Administered 2022-06-11: 13 mL via INTRAVENOUS

## 2022-06-18 ENCOUNTER — Encounter: Payer: Medicaid Other | Admitting: Nurse Practitioner

## 2022-08-19 ENCOUNTER — Other Ambulatory Visit: Payer: Self-pay | Admitting: Nurse Practitioner

## 2022-08-19 DIAGNOSIS — M79602 Pain in left arm: Secondary | ICD-10-CM

## 2022-09-24 ENCOUNTER — Encounter: Payer: Self-pay | Admitting: Nurse Practitioner

## 2022-09-24 ENCOUNTER — Ambulatory Visit (INDEPENDENT_AMBULATORY_CARE_PROVIDER_SITE_OTHER): Payer: Medicaid Other | Admitting: Nurse Practitioner

## 2022-09-24 VITALS — BP 110/60 | HR 79 | Temp 98.1°F | Ht 69.0 in | Wt 145.0 lb

## 2022-09-24 DIAGNOSIS — Z0000 Encounter for general adult medical examination without abnormal findings: Secondary | ICD-10-CM | POA: Diagnosis not present

## 2022-09-24 DIAGNOSIS — Z23 Encounter for immunization: Secondary | ICD-10-CM

## 2022-09-24 DIAGNOSIS — G40309 Generalized idiopathic epilepsy and epileptic syndromes, not intractable, without status epilepticus: Secondary | ICD-10-CM | POA: Diagnosis not present

## 2022-09-24 DIAGNOSIS — E782 Mixed hyperlipidemia: Secondary | ICD-10-CM | POA: Diagnosis not present

## 2022-09-24 DIAGNOSIS — Z79899 Other long term (current) drug therapy: Secondary | ICD-10-CM

## 2022-09-24 DIAGNOSIS — G35 Multiple sclerosis: Secondary | ICD-10-CM

## 2022-09-24 DIAGNOSIS — F209 Schizophrenia, unspecified: Secondary | ICD-10-CM

## 2022-09-24 DIAGNOSIS — H6123 Impacted cerumen, bilateral: Secondary | ICD-10-CM

## 2022-09-24 DIAGNOSIS — Z1211 Encounter for screening for malignant neoplasm of colon: Secondary | ICD-10-CM

## 2022-09-24 DIAGNOSIS — Z125 Encounter for screening for malignant neoplasm of prostate: Secondary | ICD-10-CM

## 2022-09-24 DIAGNOSIS — Z720 Tobacco use: Secondary | ICD-10-CM

## 2022-09-24 MED ORDER — PRAVASTATIN SODIUM 10 MG PO TABS: 10.0000 mg | ORAL_TABLET | Freq: Every day | ORAL | 3 refills | Status: DC

## 2022-09-24 MED ORDER — MELOXICAM 15 MG PO TABS: 15.0000 mg | ORAL_TABLET | Freq: Every day | ORAL | 3 refills | Status: DC

## 2022-09-24 NOTE — Progress Notes (Signed)
I,Tianna Badgett,acting as a Education administrator for [employer], FNP.,have documented all relevant documentation on the behalf of Minette Brine, FNP,as directed by  Minette Brine, FNP while in the presence of Minette Brine, [place].  Subjective:     Patient ID: Micheal Webb , male    DOB: 11/11/1975 , 47 y.o.   MRN: [id number]   Chief Complaint  Patient presents with   Annual Exam    HPI  Patient here for hm. He continues to see Donata Clay at [facility].  He is living in [place]. No seizure episodes in the last year. He refuses to get undressed for his health maintenance exam     Past Medical History:  Diagnosis Date   Carpal tunnel syndrome on left    MS (multiple sclerosis) (HCC)    Schizophrenia ([place])    Seizure ([place])      Family History  Problem Relation Age of Onset   Breast cancer Mother    Diverticulitis Mother        had surgery and colostomy bag 11/2015   Colon cancer Paternal Grandfather    Pancreatic cancer Maternal Grandmother      Current Outpatient Medications:    Dimethyl Fumarate 240 MG CPDR, Take 1 capsule by mouth twice per day, Disp: 60 capsule, Rfl: 5   benztropine (COGENTIN) 1 MG tablet, Take 1 mg by mouth See admin instructions. 1 tablet in the morning and 2 tablets at bedtime., Disp: , Rfl:    Cholecalciferol (VITAMIN D) 125 MCG (5000 UT) CAPS, Take 125 mcg by mouth daily. , Disp: , Rfl:    clonazePAM (KLONOPIN) 1 MG tablet, Take 1 mg by mouth in the morning and at bedtime. , Disp: , Rfl:    divalproex (DEPAKOTE ER) 500 MG 24 hr tablet, 1,500 mg at bedtime. , Disp: , Rfl: 5   lamoTRIgine (LAMICTAL) 100 MG tablet, Take 1 tablet (100 mg total) by mouth 2 (two) times daily., Disp: 60 tablet, Rfl: 5   meloxicam (MOBIC) 15 MG tablet, Take 1 tablet (15 mg total) by mouth daily., Disp: 90 tablet, Rfl: 3   pravastatin (PRAVACHOL) 10 MG tablet, Take 1 tablet (10 mg total) by mouth at bedtime., Disp: 90 tablet, Rfl: 3   risperiDONE (RISPERDAL) 2 MG tablet, Take  2 mg by mouth 2 (two) times daily. , Disp: , Rfl:    No Known Allergies   Men's preventive visit. Patient Health Questionnaire (PHQ-2) is  [place] Office Visit from 09/24/2022 in [facility]  PHQ-2 Total Score 0     Patient is on a regular diet; he is thinking about cutting back on his high sugar drinks. Exercise - 5-7 days.  Marital status: Single. Relevant history for alcohol use is:  Social History   Substance and Sexual Activity  Alcohol Use Yes   Comment: sometimes   . Relevant history for tobacco use is:  Social History   Tobacco Use  Smoking Status Every Day   Packs/day: 1.00   Years: 29.00   Total pack years: 29.00   Types: Cigarettes  Smokeless Tobacco Never  .   Review of Systems  Constitutional: Negative.   HENT: Negative.    Eyes: Negative.   Respiratory: Negative.    Cardiovascular: Negative.   Gastrointestinal: Negative.   Endocrine: Negative.   Genitourinary: Negative.   Musculoskeletal: Negative.   Skin: Negative.   Allergic/Immunologic: Negative.   Neurological: Negative.   Hematological: Negative.   Psychiatric/Behavioral: Negative.  Today's Vitals   09/24/22 1414  BP: 110/60  Pulse: 79  Temp: 98.1 F (36.7 C)  TempSrc: Oral  Weight: 145 lb (65.8 kg)  Height: 5\' 9"  (1.753 m)   Body mass index is 21.41 kg/m.   Objective:  Physical Exam Vitals reviewed.  Constitutional:      General: He is not in acute distress.    Appearance: Normal appearance. He is obese.  HENT:     Head: Normocephalic and atraumatic.     Right Ear: External ear normal. There is impacted cerumen.     Left Ear: External ear normal. There is impacted cerumen.     Nose: Nose normal.     Mouth/Throat:     Mouth: Mucous membranes are moist.     Dentition: Dental caries (multiple present) present.     Comments: Tongue is dark looks like nicotine stains.  Eyes:     Pupils: Pupils are equal, round, and reactive to light.   Cardiovascular:     Rate and Rhythm: Normal rate and regular rhythm.     Pulses: Normal pulses.     Heart sounds: Normal heart sounds. No murmur heard. Pulmonary:     Effort: Pulmonary effort is normal. No respiratory distress.     Breath sounds: Normal breath sounds. No wheezing.  Abdominal:     General: Abdomen is flat. Bowel sounds are normal. There is no distension.     Palpations: Abdomen is soft.  Genitourinary:    Comments: Patient refuses to get undressed Musculoskeletal:        General: No swelling, tenderness or deformity. Normal range of motion.     Cervical back: Normal range of motion and neck supple.  Skin:    General: Skin is warm.     Capillary Refill: Capillary refill takes less than 2 seconds.  Neurological:     General: No focal deficit present.     Mental Status: He is alert and oriented to person, place, and time.     Cranial Nerves: No cranial nerve deficit.     Motor: No weakness.  Psychiatric:        Mood and Affect: Mood normal.        Behavior: Behavior normal.        Thought Content: Thought content normal.        Judgment: Judgment normal.         Assessment And Plan:    1. Encounter for annual physical exam Comments: Patient refuses to get undressed for physical exam.  Importance of routine dental exams especially due to smoking history.  Behavior modifications discussed and diet history reviewed.   Pt will continue to exercise regularly and modify diet with low GI, plant based foods and decrease intake of processed foods.  Recommend intake of daily multivitamin, Vitamin D, and calcium.  Recommend cologuard for preventive screenings, as well as recommend immunizations that include influenza, TDAP   2. Encounter for prostate cancer screening Comments: Deferred prostate exam.  Will check PSA levels - PSA  3. Need for Tdap vaccination Will give tetanus vaccine today while in office. Refer to order management. TDAP will be administered to  adults 65-80 years old every 10 years.   - Tdap vaccine greater than or equal to 7yo IM  4. Screening for colon cancer According to USPTF Colorectal cancer Screening guidelines. Colonoscopy is recommended every 3 years, starting at age 66 years. [employer] will call patient. His mother is willing to help him - Cologuard  5. Mixed hyperlipidemia Comments: Cholesterol levels are stable, continue current medications tolerating well. - Lipid panel  6. Tobacco abuse Comments: He is not interested in quitting smoking at this time offered nicotine patches.  Discussed effects of smoking and the risk of long-term smoking.  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  8. Schizophrenia, unspecified type ([place]) Comments: Continue follow-up with Donata Clay, will send copy of labs at her request - Hemoglobin A1c - CMP14+EGFR  9. Other long term (current) drug therapy - CBC - Hemoglobin A1c - VITAMIN D 25 Hydroxy (Vit-D Deficiency, Fractures) - TSH - T4  10. Bilateral impacted cerumen Comments: Bilateral water lavage done with good results.  Discussed importance of avoiding using Q-tips.  Use 1/2 water 1/2 peroxide solution or Debrox drops to the ears  11. Multiple sclerosis ([place]) Comments: Continue follow-up with neurology, no recent exam exacerbations     Patient was given opportunity to ask questions. Patient verbalized understanding of the plan and was able to repeat key elements of the plan. All questions were answered to their satisfaction.   Minette Brine, FNP    I, Minette Brine, FNP, have reviewed all documentation for this visit. The documentation on 09/24/22 for the exam, diagnosis, procedures, and orders are all accurate and complete.  THE PATIENT IS ENCOURAGED TO PRACTICE SOCIAL DISTANCING DUE TO THE COVID-19 PANDEMIC.

## 2022-09-24 NOTE — Patient Instructions (Signed)
Health Maintenance, Male Adopting a healthy lifestyle and getting preventive care are important in promoting health and wellness. Ask your health care provider about: The right schedule for you to have regular tests and exams. Things you can do on your own to prevent diseases and keep yourself healthy. What should I know about diet, weight, and exercise? Eat a healthy diet  Eat a diet that includes plenty of vegetables, fruits, low-fat dairy products, and lean protein. Do not eat a lot of foods that are high in solid fats, added sugars, or sodium. Maintain a healthy weight Body mass index (BMI) is a measurement that can be used to identify possible weight problems. It estimates body fat based on height and weight. Your health care provider can help determine your BMI and help you achieve or maintain a healthy weight. Get regular exercise Get regular exercise. This is one of the most important things you can do for your health. Most adults should: Exercise for at least 150 minutes each week. The exercise should increase your heart rate and make you sweat (moderate-intensity exercise). Do strengthening exercises at least twice a week. This is in addition to the moderate-intensity exercise. Spend less time sitting. Even light physical activity can be beneficial. Watch cholesterol and blood lipids Have your blood tested for lipids and cholesterol at 49 years of age, then have this test every 5 years. You may need to have your cholesterol levels checked more often if: Your lipid or cholesterol levels are high. You are older than 49 years of age. You are at high risk for heart disease. What should I know about cancer screening? Many types of cancers can be detected early and may often be prevented. Depending on your health history and family history, you may need to have cancer screening at various ages. This may include screening for: Colorectal cancer. Prostate cancer. Skin cancer. Lung  cancer. What should I know about heart disease, diabetes, and high blood pressure? Blood pressure and heart disease High blood pressure causes heart disease and increases the risk of stroke. This is more likely to develop in people who have high blood pressure readings or are overweight. Talk with your health care provider about your target blood pressure readings. Have your blood pressure checked: Every 3-5 years if you are 18-39 years of age. Every year if you are 40 years old or older. If you are between the ages of 65 and 75 and are a current or former smoker, ask your health care provider if you should have a one-time screening for abdominal aortic aneurysm (AAA). Diabetes Have regular diabetes screenings. This checks your fasting blood sugar level. Have the screening done: Once every three years after age 45 if you are at a normal weight and have a low risk for diabetes. More often and at a younger age if you are overweight or have a high risk for diabetes. What should I know about preventing infection? Hepatitis B If you have a higher risk for hepatitis B, you should be screened for this virus. Talk with your health care provider to find out if you are at risk for hepatitis B infection. Hepatitis C Blood testing is recommended for: Everyone born from 1945 through 1965. Anyone with known risk factors for hepatitis C. Sexually transmitted infections (STIs) You should be screened each year for STIs, including gonorrhea and chlamydia, if: You are sexually active and are younger than 49 years of age. You are older than 49 years of age and your   health care provider tells you that you are at risk for this type of infection. Your sexual activity has changed since you were last screened, and you are at increased risk for chlamydia or gonorrhea. Ask your health care provider if you are at risk. Ask your health care provider about whether you are at high risk for HIV. Your health care provider  may recommend a prescription medicine to help prevent HIV infection. If you choose to take medicine to prevent HIV, you should first get tested for HIV. You should then be tested every 3 months for as long as you are taking the medicine. Follow these instructions at home: Alcohol use Do not drink alcohol if your health care provider tells you not to drink. If you drink alcohol: Limit how much you have to 0-2 drinks a day. Know how much alcohol is in your drink. In the [place] one drink equals one 12 oz bottle of beer (355 mL), one 5 oz glass of wine (148 mL), or one 1 oz glass of hard liquor (44 mL). Lifestyle Do not use any products that contain nicotine or tobacco. These products include cigarettes, chewing tobacco, and vaping devices, such as e-cigarettes. If you need help quitting, ask your health care provider. Do not use street drugs. Do not share needles. Ask your health care provider for help if you need support or information about quitting drugs. General instructions Schedule regular health, dental, and eye exams. Stay current with your vaccines. Tell your health care provider if: You often feel depressed. You have ever been abused or do not feel safe at home. Summary Adopting a healthy lifestyle and getting preventive care are important in promoting health and wellness. Follow your health care provider's instructions about healthy diet, exercising, and getting tested or screened for diseases. Follow your health care provider's instructions on monitoring your cholesterol and blood pressure. This information is not intended to replace advice given to you by your health care provider. Make sure you discuss any questions you have with your health care provider. Document Revised: 01/14/2021 Document Reviewed: 01/14/2021 Elsevier Patient Education  2023 Elsevier Inc.  

## 2022-09-25 LAB — CMP14+EGFR
ALT: 15 IU/L (ref 0–44)
AST: 23 IU/L (ref 0–40)
Albumin/Globulin Ratio: 2.1 (ref 1.2–2.2)
Albumin: 4.8 g/dL (ref 4.1–5.1)
Alkaline Phosphatase: 98 IU/L (ref 44–121)
BUN/Creatinine Ratio: 12 (ref 9–20)
BUN: 8 mg/dL (ref 6–24)
Bilirubin Total: 0.5 mg/dL (ref 0.0–1.2)
CO2: 23 mmol/L (ref 20–29)
Calcium: 9.8 mg/dL (ref 8.7–10.2)
Chloride: 95 mmol/L — ABNORMAL LOW (ref 96–106)
Creatinine, Ser: 0.67 mg/dL — ABNORMAL LOW (ref 0.76–1.27)
Globulin, Total: 2.3 g/dL (ref 1.5–4.5)
Glucose: 74 mg/dL (ref 70–99)
Potassium: 4.5 mmol/L (ref 3.5–5.2)
Sodium: 135 mmol/L (ref 134–144)
Total Protein: 7.1 g/dL (ref 6.0–8.5)
eGFR: 115 mL/min/{1.73_m2} (ref >59)

## 2022-09-25 LAB — CBC
Hematocrit: 43.5 % (ref 37.5–51.0)
Hemoglobin: 15.6 g/dL (ref 13.0–17.7)
MCH: 33.4 pg — ABNORMAL HIGH (ref 26.6–33.0)
MCHC: 35.9 g/dL — ABNORMAL HIGH (ref 31.5–35.7)
MCV: 93 fL (ref 79–97)
Platelets: 263 10*3/uL (ref 150–450)
RBC: 4.67 x10E6/uL (ref 4.14–5.80)
RDW: 12.5 % (ref 11.6–15.4)
WBC: 15.2 10*3/uL — ABNORMAL HIGH (ref 3.4–10.8)

## 2022-09-25 LAB — LIPID PANEL
Chol/HDL Ratio: 4.2 ratio (ref 0.0–5.0)
Cholesterol, Total: 167 mg/dL (ref 100–199)
HDL: 40 mg/dL (ref >39)
LDL Chol Calc (NIH): 113 mg/dL — ABNORMAL HIGH (ref 0–99)
Triglycerides: 70 mg/dL (ref 0–149)
VLDL Cholesterol Cal: 14 mg/dL (ref 5–40)

## 2022-09-25 LAB — HEMOGLOBIN A1C
Est. average glucose Bld gHb Est-mCnc: 100 mg/dL
Hgb A1c MFr Bld: 5.1 % (ref 4.8–5.6)

## 2022-09-25 LAB — VITAMIN D 25 HYDROXY (VIT D DEFICIENCY, FRACTURES): Vit D, 25-Hydroxy: 107 ng/mL — ABNORMAL HIGH (ref 30.0–100.0)

## 2022-09-25 LAB — PSA: Prostate Specific Ag, Serum: 1.4 ng/mL (ref 0.0–4.0)

## 2022-09-25 LAB — TSH: TSH: 1.51 u[IU]/mL (ref 0.450–4.500)

## 2022-09-25 LAB — T4: T4, Total: 11.4 ug/dL (ref 4.5–12.0)

## 2022-10-07 ENCOUNTER — Encounter: Payer: Self-pay | Admitting: Nurse Practitioner

## 2022-10-12 DIAGNOSIS — J850 Gangrene and necrosis of lung: Secondary | ICD-10-CM

## 2022-10-12 HISTORY — DX: Gangrene and necrosis of lung: J85.0

## 2022-10-13 ENCOUNTER — Telehealth: Payer: Self-pay

## 2022-10-13 NOTE — Telephone Encounter (Signed)
Transition Care Management Unsuccessful Follow-up Telephone Call  Date of discharge and from where:  [facility] 10-10-22 Dx:   Attempts:  1st Attempt  Reason for unsuccessful TCM follow-up call:  Left voice message   Juanda Crumble LPN [place] Direct Dial [phone]

## 2022-10-14 ENCOUNTER — Other Ambulatory Visit: Payer: Self-pay | Admitting: Neurology

## 2022-10-14 NOTE — Telephone Encounter (Signed)
Transition Care Management Follow-up Telephone Call Date of discharge and from where:   [facility] 10-10-22 Dx: pneumonia   How have you been since you were released from the hospital? Doing ok  Any questions or concerns? Yes- mother is requesting nictone patches 21mg  to be faxed to [place]   Items Reviewed: Did the pt receive and understand the discharge instructions provided? Yes  Medications obtained and verified? Yes  Other? No  Any new allergies since your discharge? No  Dietary orders reviewed? Yes Do you have support at home? Yes   Home Care and Equipment/Supplies: Were home health services ordered? no If so, what is the name of the agency? na  Has the agency set up a time to come to the patient's home? not applicable Were any new equipment or medical supplies ordered?  No What is the name of the medical supply agency? na Were you able to get the supplies/equipment? not applicable Do you have any questions related to the use of the equipment or supplies? No  Functional Questionnaire: (I = Independent and D = Dependent) ADLs: I  Bathing/Dressing- I  Meal Prep- I  Eating- I  Maintaining continence- I  Transferring/Ambulation- I  Managing Meds- D- MOTHER fills pill planner   Follow up appointments reviewed:  PCP Hospital f/u appt confirmed? Yes  Scheduled to see Minette Brine FNP on 10-20-22 @ [facility] f/u appt confirmed? Yes  Scheduled to see Dr Alcide Clever on 10-28-22 @ 3pm. Are transportation arrangements needed? No  If their condition worsens, is the pt aware to call PCP or go to the Emergency Dept.? Yes Was the patient provided with contact information for the PCP's office or ED? Yes Was to pt encouraged to call back with questions or concerns? Yes   Juanda Crumble LPN [place] Direct Dial [phone]

## 2022-10-14 NOTE — Telephone Encounter (Signed)
Follow up visit scheduled on 11/13/22

## 2022-10-20 ENCOUNTER — Ambulatory Visit (INDEPENDENT_AMBULATORY_CARE_PROVIDER_SITE_OTHER): Payer: Medicaid Other | Admitting: Nurse Practitioner

## 2022-10-20 ENCOUNTER — Encounter: Payer: Self-pay | Admitting: Nurse Practitioner

## 2022-10-20 VITALS — BP 112/68 | HR 92 | Temp 97.7°F | Ht 69.0 in | Wt 138.0 lb

## 2022-10-20 DIAGNOSIS — Z720 Tobacco use: Secondary | ICD-10-CM

## 2022-10-20 DIAGNOSIS — J850 Gangrene and necrosis of lung: Secondary | ICD-10-CM

## 2022-10-20 LAB — CBC WITH DIFFERENTIAL/PLATELET
Basophils Absolute: 0.1 10*3/uL (ref 0.0–0.2)
Basos: 1 %
EOS (ABSOLUTE): 0.1 10*3/uL (ref 0.0–0.4)
Eos: 1 %
Hematocrit: 39.9 % (ref 37.5–51.0)
Hemoglobin: 13.7 g/dL (ref 13.0–17.7)
Immature Grans (Abs): 0.1 10*3/uL (ref 0.0–0.1)
Immature Granulocytes: 1 %
Lymphocytes Absolute: 2 10*3/uL (ref 0.7–3.1)
Lymphs: 19 %
MCH: 32.3 pg (ref 26.6–33.0)
MCHC: 34.3 g/dL (ref 31.5–35.7)
MCV: 94 fL (ref 79–97)
Monocytes Absolute: 0.8 10*3/uL (ref 0.1–0.9)
Monocytes: 8 %
Neutrophils Absolute: 7.2 10*3/uL — ABNORMAL HIGH (ref 1.4–7.0)
Neutrophils: 70 %
Platelets: 326 10*3/uL (ref 150–450)
RBC: 4.24 x10E6/uL (ref 4.14–5.80)
RDW: 12.4 % (ref 11.6–15.4)
WBC: 10.3 10*3/uL (ref 3.4–10.8)

## 2022-10-20 MED ORDER — NICOTINE 21 MG/24HR TD PT24: 21.0000 mg | MEDICATED_PATCH | TRANSDERMAL | 1 refills | Status: DC

## 2022-10-20 NOTE — Addendum Note (Signed)
Addended by: Minette Brine F on: 10/20/2022 04:47 PM   Modules accepted: Orders

## 2022-10-20 NOTE — Progress Notes (Signed)
I,Sheena H Holbrook,acting as a Education administrator for Minette Brine, FNP.,have documented all relevant documentation on the behalf of Minette Brine, FNP,as directed by  Minette Brine, FNP while in the presence of Minette Brine, [place].    Subjective:     Patient ID: Micheal Webb , male    DOB: [birth year] , 47 y.o.   MRN: [id number]   Chief Complaint  Patient presents with   [facility] 10-10-22 Dx: pneumonia    HPI  Patient presents today for TCM visit. D/C [facility] 10-10-22 after admission on 1/31 Dx: necrotizing pneumonia. He had a fever and his O2 sats had dropped after getting to ER he was placed on Oxyggen and given Levaquin and Zosyn which he then improved, he was sent home with 2 weeks of antibiotics - Levaquin and Flagyl. He is to f/u with Dr. Alcide Clever to see if he will need further antibiotics. His pleuritic chest pain had also improved.   Patient reports he has improved. January 30th was admitted to hospital. Awaiting biopsy results.       Past Medical History:  Diagnosis Date   Carpal tunnel syndrome on left    MS (multiple sclerosis) (HCC)    Necrotizing pneumonia ([place]) 10/12/2022   Schizophrenia ([place])    Seizure ([place])      Family History  Problem Relation Age of Onset   Breast cancer Mother    Diverticulitis Mother        had surgery and colostomy bag 11/2015   Colon cancer Paternal Grandfather    Pancreatic cancer Maternal Grandmother      Current Outpatient Medications:    benztropine (COGENTIN) 1 MG tablet, Take 1 mg by mouth See admin instructions. 1 tablet in the morning and 2 tablets at bedtime., Disp: , Rfl:    Cholecalciferol (VITAMIN D) 125 MCG (5000 UT) CAPS, Take 125 mcg by mouth daily. , Disp: , Rfl:    clonazePAM (KLONOPIN) 1 MG tablet, Take 1 mg by mouth in the morning and at bedtime. , Disp: , Rfl:    divalproex (DEPAKOTE ER) 500 MG 24 hr tablet, 1,500 mg at bedtime. , Disp: , Rfl: 5   meloxicam (MOBIC) 15 MG tablet, Take 1 tablet  (15 mg total) by mouth daily., Disp: 90 tablet, Rfl: 3   nicotine (NICODERM CQ - DOSED IN MG/24 HOURS) 21 mg/24hr patch, Place 1 patch (21 mg total) onto the skin daily., Disp: 30 patch, Rfl: 1   pravastatin (PRAVACHOL) 10 MG tablet, Take 1 tablet (10 mg total) by mouth at bedtime., Disp: 90 tablet, Rfl: 3   risperiDONE (RISPERDAL) 2 MG tablet, Take 2 mg by mouth 2 (two) times daily. , Disp: , Rfl:    Dimethyl Fumarate 240 MG CPDR, One po bid with food, Disp: 60 capsule, Rfl: 11   lamoTRIgine (LAMICTAL) 100 MG tablet, Take 1 tablet (100 mg total) by mouth 2 (two) times daily., Disp: 60 tablet, Rfl: 11   No Known Allergies   Review of Systems  Constitutional: Negative.   Respiratory: Negative.  Negative for cough and wheezing.   Cardiovascular: Negative.   Neurological: Negative.   Psychiatric/Behavioral: Negative.    All other systems reviewed and are negative.    Today's Vitals   10/20/22 1608  BP: 112/68  Pulse: 92  Temp: 97.7 F (36.5 C)  TempSrc: Oral  SpO2: 99%  Weight: 138 lb (62.6 kg)  Height: 5\' 9"  (1.753 m)   Body mass index is  20.38 kg/m.   Objective:  Physical Exam Vitals reviewed.  Constitutional:      General: He is not in acute distress.    Appearance: Normal appearance. He is obese.  HENT:     Head: Normocephalic.  Eyes:     Extraocular Movements: Extraocular movements intact.     Conjunctiva/sclera: Conjunctivae normal.     Pupils: Pupils are equal, round, and reactive to light.  Cardiovascular:     Rate and Rhythm: Normal rate and regular rhythm.     Pulses: Normal pulses.     Heart sounds: Normal heart sounds. No murmur heard. Pulmonary:     Effort: Pulmonary effort is normal. No respiratory distress.     Breath sounds: Normal breath sounds. No wheezing.  Skin:    General: Skin is warm and dry.     Capillary Refill: Capillary refill takes less than 2 seconds.  Neurological:     General: No focal deficit present.     Mental Status: He is alert  and oriented to person, place, and time.     Cranial Nerves: No cranial nerve deficit.     Motor: No weakness.  Psychiatric:        Mood and Affect: Mood normal.        Behavior: Behavior normal.        Thought Content: Thought content normal.        Judgment: Judgment normal.         Assessment And Plan:     1. Necrotizing pneumonia ([place]) TCM Performed. A member of the clinical team spoke with the patient upon dischare. Discharge summary was reviewed in full detail during the visit. Meds reconciled and compared to discharge meds. Medication list is updated and reviewed with the patient.  Greater than 50% face to face time was spent in counseling an coordination of care.  All questions were answered to the satisfaction of the patient.   He is doing better and has a f/u appt with Dr. Carloyn Manner - CBC with Diff  2. Tobacco abuse Comments: He is ready to quit smoking, will start him on Nicotine patches.  Ready to quit: Yes Counseling given: Yes Smoking cessation instruction/counseling given:  counseled patient on the dangers of tobacco use, advised patient to stop smoking, and reviewed strategies to maximize success - nicotine (NICODERM CQ - DOSED IN MG/24 HOURS) 21 mg/24hr patch; Place 1 patch (21 mg total) onto the skin daily.  Dispense: 30 patch; Refill: 1     Patient was given opportunity to ask questions. Patient verbalized understanding of the plan and was able to repeat key elements of the plan. All questions were answered to their satisfaction.  Minette Brine, FNP   I, Minette Brine, FNP, have reviewed all documentation for this visit. The documentation on 10/20/22 for the exam, diagnosis, procedures, and orders are all accurate and complete.   IF YOU HAVE BEEN REFERRED TO A SPECIALIST, IT MAY TAKE 1-2 WEEKS TO SCHEDULE/PROCESS THE REFERRAL. IF YOU HAVE NOT HEARD FROM US/SPECIALIST IN TWO WEEKS, PLEASE GIVE [place] A CALL AT [phone].   THE PATIENT IS ENCOURAGED TO PRACTICE  SOCIAL DISTANCING DUE TO THE COVID-19 PANDEMIC.

## 2022-10-28 DIAGNOSIS — Z0271 Encounter for disability determination: Secondary | ICD-10-CM

## 2022-11-11 ENCOUNTER — Other Ambulatory Visit: Payer: Self-pay | Admitting: Neurology

## 2022-11-13 ENCOUNTER — Encounter: Payer: Self-pay | Admitting: Neurology

## 2022-11-13 ENCOUNTER — Ambulatory Visit: Payer: Medicaid Other | Admitting: Neurology

## 2022-11-13 DIAGNOSIS — G35 Multiple sclerosis: Secondary | ICD-10-CM

## 2022-11-13 MED ORDER — DIMETHYL FUMARATE 240 MG PO CPDR: DELAYED_RELEASE_CAPSULE | ORAL | 11 refills | Status: DC

## 2022-11-13 MED ORDER — LAMOTRIGINE 100 MG PO TABS: 100.0000 mg | ORAL_TABLET | Freq: Two times a day (BID) | ORAL | 11 refills | Status: DC

## 2022-11-13 NOTE — Progress Notes (Signed)
[facility]  PATIENT: Micheal Webb DOB: 1975/09/18  REFERRING DOCTOR OR PCP:  Minette Brine, FNP SOURCE: patient, notes from ED, lab and imaging reports, images personally reviewed.   _________________________________   HISTORICAL  CHIEF COMPLAINT:  Chief Complaint  Patient presents with   Follow-up    RM 2. MS DMT: dimethyl fumarate. Last seen 11/07/21.     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 He denies flushing or GI issues.     He had a hospital stay for pneumonia felt to be due to tobacco use.       He feels gait is stable.   He is sometimes mildly off balanced.  No falls.   He denies leg or arm weakness.   He denies numbness or tingling now but has had some in past (occ will get numbness if sits a long time but quickly better with moving).     Vision is doing well.  Bladder function is fine.      He has some fatigue.    He sleeps well most nights.    He has schizophrenia and sees Adolph Pollack ([facility]).  [name]  He is on lamotrigine, depakote, risperdal and clonazepam.    He has no recent seizures (GTC followed by confusion).  The last one was in 2022.   The lamotrigine dose was increased and he has done better.     Currently on  Depakote 1500 mg nightly, lamotrigine 100 mg po bid and clonazepam 1 mg po bid.     Lymphocytes 10/20/2022 was normal.    Lamotrigine 05/15/2022 was 6/3 (2-20.0); VPA was 31 (50-100) - advised to take meds as prescribed.    MS History: He presented to the [facility] emergency department 12/19/2019 age 6. At that time he had slurred speech, gait ataxia and lethargy. He also felt more confused.  An MRI of the brain was performed showing the multiple lesions including some that enhanced, in a pattern and distribution consistent with multiple sclerosis. We do not have an older MRI for  comparison but a CT scan done before the MRI shows foci that were not present on a CT scan done several years ago. He was admitted to the hospital but left AMA the next day. He did receive at least a couple doses of IV Solu-Medrol and he felt his symptoms improved.     IMAGING MRI of the brain 4/13/2021shows multiple T2/FLAIR hyperintense foci in the periventricular, juxtacortical and deep white matter of the hemispheres and a few foci in the brainstem. Some periventricular foci are radially oriented to the ventricles. About 8 of the foci in the hemispheres enhance after contrast. The enhancement is when shaped in general.  MRI brain 06/11/2022 showed Multiple T2/FLAIR hyperintense foci in the cerebral hemispheres, cerebellum and brainstem in a pattern consistent with chronic demyelinating plaque associated with multiple sclerosis. None of the foci enhanced or appear to be acute. Compared to the MRI from 06/08/2020, there are no new lesions. Normal enhancement pattern.     REVIEW OF SYSTEMS: Constitutional: No fevers, chills, sweats, or change in appetite.  Sleeps well but irregular hours Eyes: No visual changes, double vision, eye pain Ear, nose and throat: No hearing loss, ear pain, nasal congestion, sore throat Cardiovascular: No chest pain, palpitations Respiratory:  No shortness of breath at rest or with  exertion.   No wheezes GastrointestinaI: No nausea, vomiting, diarrhea, abdominal pain, fecal incontinence Genitourinary:  No dysuria, urinary retention or frequency.  No nocturia. Musculoskeletal:  notes neck pain and sometimes arm pain Integumentary: No rash, pruritus, skin lesions Neurological: as above Psychiatric: see above Endocrine: No palpitations, diaphoresis, change in appetite, change in weigh or increased thirst Hematologic/Lymphatic:  No anemia, purpura, petechiae. Allergic/Immunologic: No itchy/runny eyes, nasal congestion, recent allergic reactions, rashes  ALLERGIES: No  Known Allergies  HOME MEDICATIONS:  Current Outpatient Medications:    benztropine (COGENTIN) 1 MG tablet, Take 1 mg by mouth See admin instructions. 1 tablet in the morning and 2 tablets at bedtime., Disp: , Rfl:    Cholecalciferol (VITAMIN D) 125 MCG (5000 UT) CAPS, Take 125 mcg by mouth daily. , Disp: , Rfl:    clonazePAM (KLONOPIN) 1 MG tablet, Take 1 mg by mouth in the morning and at bedtime. , Disp: , Rfl:    Dimethyl Fumarate (TECFIDERA) 240 MG CPDR, Take 1 capsule by mouth twice per day, Disp: 60 capsule, Rfl: 11   divalproex (DEPAKOTE ER) 500 MG 24 hr tablet, 1,500 mg at bedtime. , Disp: , Rfl: 5   lamoTRIgine (LAMICTAL) 100 MG tablet, Take 1 tablet (100 mg total) by mouth 2 (two) times daily., Disp: 60 tablet, Rfl: 5   meloxicam (MOBIC) 15 MG tablet, TAKE ONE TABLET BY MOUTH DAILY, Disp: 90 tablet, Rfl: 4   pravastatin (PRAVACHOL) 10 MG tablet, TAKE ONE TABLET BY MOUTH AT BEDTIME, Disp: 90 tablet, Rfl: 11   risperiDONE (RISPERDAL) 2 MG tablet, Take 2 mg by mouth 2 (two) times daily. , Disp: , Rfl:   PAST MEDICAL HISTORY: Past Medical History:  Diagnosis Date   Carpal tunnel syndrome on left    MS (multiple sclerosis) (HCC)    Schizophrenia ([place])    Seizure ([place])     PAST SURGICAL HISTORY: Past Surgical History:  Procedure Laterality Date   None      FAMILY HISTORY: Family History  Problem Relation Age of Onset   Breast cancer Mother    Diverticulitis Mother        had surgery and colostomy bag 11/2015   Colon cancer Paternal Grandfather    Pancreatic cancer Maternal Grandmother     SOCIAL HISTORY:  Social History   Socioeconomic History   Marital status: Single    Spouse name: Not on file   Number of children: 0   Years of education: 8   Highest education level: Not on file  Occupational History   Occupation: Disabled   Tobacco Use   Smoking status: Every Day    Packs/day: 1.00    Years: 29.00    Total pack years: 29.00    Types: Cigarettes    Smokeless tobacco: Never  Substance and Sexual Activity   Alcohol use: Yes    Comment: sometimes    Drug use: No    Comment: Denies   Sexual activity: Not on file  Other Topics Concern   Not on file  Social History Narrative   Patient lives alone.    Patient is on disability.   Patient is single.    Caffeine use: coffee    Social Determinants of Radio broadcast assistant Strain: Not on file  Food Insecurity: Not on file  Transportation Needs: Not on file  Physical Activity: Not on file  Stress: Not on file  Social Connections: Not on file  Intimate Partner Violence: Not on file  PHYSICAL EXAM  Vitals:   05/15/22 1519  BP: 110/70  Pulse: 76  SpO2: 98%  Weight: 140 lb 8 oz (63.7 kg)  Height: '5\' 9"'$  (1.753 m)    Body mass index is 20.75 kg/m.  No results found.  General: The patient is well-developed and well-nourished and in no acute distress.  Neck with good ROM   Skin: Extremities are without rash or  edema.   Neurologic Exam  Mental status: The patient is alert and oriented x 3 at the time of the examination. The patient has apparent normal recent and remote memory, with an apparently normal attention span and concentration ability.   Speech is normal.  Cranial nerves: Extraocular movements are full.   He states acuity is symmetric color vision is reduced on the right.  Facial symmetry is present. There is good facial sensation to soft touch bilaterally.Facial strength is normal.  Trapezius and sternocleidomastoid strength is normal. No dysarthria is noted.  No obvious hearing deficits are noted.  Motor:  Muscle bulk is normal.   Tone is normal. Strength is  5 / 5 in all 4 extremities.   Sensory: Sensory testing is intact to pinprick, soft touch and vibration sensation in all 4 extremities.  Coordination: Cerebellar testing reveals good finger-nose-finger and heel-to-shin bilaterally.  Gait and station: Station is normal.   Gait is mildly wide.   Tandem  is wide. Romberg is negative.   Reflexes: Deep tendon reflexes are symmetric and normal bilaterally.         DIAGNOSTIC DATA (LABS, IMAGING, TESTING) - I reviewed patient records, labs, notes, testing and imaging myself where available.  Lab Results  Component Value Date   WBC 8.5 11/07/2021   HGB 15.0 11/07/2021   HCT 41.6 11/07/2021   MCV 92 11/07/2021   PLT 258 11/07/2021      Component Value Date/Time   NA 137 06/17/2021 1541   K 4.6 06/17/2021 1541   CL 97 06/17/2021 1541   CO2 26 06/17/2021 1541   GLUCOSE 70 06/17/2021 1541   GLUCOSE 167 (H) 12/22/2019 1239   BUN 10 06/17/2021 1541   CREATININE 0.60 (L) 06/17/2021 1541   CALCIUM 9.8 06/17/2021 1541   PROT 7.3 11/07/2021 1110   ALBUMIN 4.8 11/07/2021 1110   AST 19 11/07/2021 1110   ALT 12 11/07/2021 1110   ALKPHOS 92 11/07/2021 1110   BILITOT 0.4 11/07/2021 1110   GFRNONAA 111 06/11/2020 1544   GFRAA 129 06/11/2020 1544   Lab Results  Component Value Date   CHOL 160 06/17/2021   HDL 47 06/17/2021   LDLCALC 102 (H) 06/17/2021   TRIG 54 06/17/2021   CHOLHDL 3.4 06/17/2021       ASSESSMENT AND PLAN  Multiple sclerosis ([place]) - Plan: MR BRAIN W WO CONTRAST, CBC with Differential/Platelet, Comprehensive metabolic panel, Valproic acid level, Lamotrigine level  High risk medication use - Plan: MR BRAIN W WO CONTRAST, CBC with Differential/Platelet, Comprehensive metabolic panel, Valproic acid level, Lamotrigine level  Generalized convulsive epilepsy ([place]) - Plan: CBC with Differential/Platelet, Comprehensive metabolic panel, Valproic acid level, Lamotrigine level  Schizophrenia, unspecified type ([place])   1. Continue dimethyl fumarate 240 mg p.o. twice daily.  He has recent  CBC with differential and CMP.   Can check MRI every 2 years - next time in 2025  2.  Continue Depakote and lamotrigine at current dose.  Check VPA and lamotrigine  level at next visit  3.  Stay active and exercise as tolerated. 4.  rtc  6 months or sooner if new or worsening neurologic issues.  Send copy of labs to Noemi Chapel, [place] [facility] (Dr. Reece Levy)  Alera Quevedo A. Felecia Shelling, MD, [facility] [id number], [id number] PM Certified in Neurology, Clinical Neurophysiology, Sleep Medicine and Neuroimaging  [facility] [address] [place], [place] [address] [phone]

## 2022-11-23 ENCOUNTER — Encounter: Payer: Self-pay | Admitting: Nurse Practitioner

## 2022-11-25 ENCOUNTER — Telehealth: Payer: Self-pay | Admitting: Neurology

## 2022-11-25 ENCOUNTER — Other Ambulatory Visit: Payer: Self-pay | Admitting: *Deleted

## 2022-11-25 DIAGNOSIS — G35 Multiple sclerosis: Secondary | ICD-10-CM

## 2022-11-25 MED ORDER — DIMETHYL FUMARATE 240 MG PO CPDR: 1.0000 | DELAYED_RELEASE_CAPSULE | Freq: Two times a day (BID) | ORAL | 11 refills | Status: DC

## 2022-11-25 NOTE — Telephone Encounter (Signed)
Micheal Webb called from [place]. Stated she needs a prescription for Dimethyl Fumarate 240 MG CPDR sent to [facility]

## 2022-11-25 NOTE — Telephone Encounter (Signed)
Rx was already sent today at 1:52pm.

## 2022-12-08 ENCOUNTER — Other Ambulatory Visit: Payer: Self-pay | Admitting: Nurse Practitioner

## 2022-12-08 DIAGNOSIS — Z720 Tobacco use: Secondary | ICD-10-CM

## 2023-03-25 ENCOUNTER — Ambulatory Visit (INDEPENDENT_AMBULATORY_CARE_PROVIDER_SITE_OTHER): Payer: MEDICAID | Admitting: Nurse Practitioner

## 2023-03-25 ENCOUNTER — Encounter: Payer: Self-pay | Admitting: Nurse Practitioner

## 2023-03-25 VITALS — BP 100/60 | HR 77 | Temp 98.5°F | Ht 69.0 in | Wt 135.8 lb

## 2023-03-25 DIAGNOSIS — Z720 Tobacco use: Secondary | ICD-10-CM

## 2023-03-25 DIAGNOSIS — F209 Schizophrenia, unspecified: Secondary | ICD-10-CM | POA: Diagnosis not present

## 2023-03-25 DIAGNOSIS — E782 Mixed hyperlipidemia: Secondary | ICD-10-CM

## 2023-03-25 DIAGNOSIS — E559 Vitamin D deficiency, unspecified: Secondary | ICD-10-CM

## 2023-03-25 DIAGNOSIS — G40309 Generalized idiopathic epilepsy and epileptic syndromes, not intractable, without status epilepticus: Secondary | ICD-10-CM

## 2023-03-25 DIAGNOSIS — Z6820 Body mass index (BMI) 20.0-20.9, adult: Secondary | ICD-10-CM

## 2023-03-25 DIAGNOSIS — Z1211 Encounter for screening for malignant neoplasm of colon: Secondary | ICD-10-CM

## 2023-03-25 NOTE — Assessment & Plan Note (Signed)
Continue follow-up with neurology.  No reported seizure activity.

## 2023-03-25 NOTE — Progress Notes (Signed)
Madelaine Bhat, CMA,acting as a Neurosurgeon for Micheal Felts, FNP.,have documented all relevant documentation on the behalf of Micheal Felts, FNP,as directed by  Micheal Felts, FNP while in the presence of Micheal Felts, FNP.  Subjective:  Patient ID: Micheal Webb , male    DOB: 12-01-1975 , 47 y.o.   MRN: [id number]  Chief Complaint  Patient presents with   Hyperlipidemia    HPI  Patient presents today for a chol follow up. Patient reports compliance with medications, patient denies any chest pains, SOB, or headaches. Patient has no other concerns today. He has seen the pulmonologist last week and will have another walking test in 3 months. Will repeat his xray the area to his lungs has gotten smaller. He has recently been to the dentist and will need some crowns.   Patient presents with his mom [name]. BP Readings from Last 3 Encounters: 03/25/23 : 100/60 11/13/22 : 104/67 10/20/22 : 112/68       Past Medical History:  Diagnosis Date   AMS (altered mental status) 12/19/2019   Carpal tunnel syndrome on left    MS (multiple sclerosis) (HCC)    Necrotizing pneumonia (HCC) 10/12/2022   Schizophrenia (HCC)    Seizure (HCC)    Slurred speech 01/04/2020     Family History  Problem Relation Age of Onset   Breast cancer Mother    Diverticulitis Mother        had surgery and colostomy bag 11/2015   Colon cancer Paternal Grandfather    Pancreatic cancer Maternal Grandmother      Current Outpatient Medications:    benztropine (COGENTIN) 1 MG tablet, Take 1 mg by mouth See admin instructions. 1 tablet in the morning and 2 tablets at bedtime., Disp: , Rfl:    Cholecalciferol (VITAMIN D) 125 MCG (5000 UT) CAPS, Take 125 mcg by mouth daily. , Disp: , Rfl:    clonazePAM (KLONOPIN) 1 MG tablet, Take 1 mg by mouth in the morning and at bedtime. , Disp: , Rfl:    Dimethyl Fumarate 240 MG CPDR, Take 1 capsule (240 mg total) by mouth 2 (two) times daily. One po bid with food, Disp: 60 capsule,  Rfl: 11   divalproex (DEPAKOTE ER) 500 MG 24 hr tablet, 1,500 mg at bedtime. , Disp: , Rfl: 5   lamoTRIgine (LAMICTAL) 100 MG tablet, Take 1 tablet (100 mg total) by mouth 2 (two) times daily., Disp: 60 tablet, Rfl: 11   meloxicam (MOBIC) 15 MG tablet, Take 1 tablet (15 mg total) by mouth daily., Disp: 90 tablet, Rfl: 3   nicotine (NICOTINE STEP 1) 21 mg/24hr patch, PLACE 1 PATCH (21 MG TOTAL) ONTO THE SKIN DAILY, Disp: 7 patch, Rfl: 0   pravastatin (PRAVACHOL) 10 MG tablet, Take 1 tablet (10 mg total) by mouth at bedtime., Disp: 90 tablet, Rfl: 3   risperiDONE (RISPERDAL) 2 MG tablet, Take 2 mg by mouth 2 (two) times daily. , Disp: , Rfl:    No Known Allergies   Review of Systems  Constitutional: Negative.   HENT: Negative.    Eyes: Negative.   Respiratory: Negative.    Cardiovascular: Negative.   Gastrointestinal: Negative.   Neurological: Negative.   Psychiatric/Behavioral: Negative.       Today's Vitals   03/25/23 1411  BP: 100/60  Pulse: 77  Temp: 98.5 F (36.9 C)  TempSrc: Oral  Weight: 135 lb 12.8 oz (61.6 kg)  Height: 5\' 9"  (1.753 m)  PainSc: 0-No pain   Body  mass index is 20.05 kg/m.  Wt Readings from Last 3 Encounters:  03/25/23 135 lb 12.8 oz (61.6 kg)  11/13/22 140 lb (63.5 kg)  10/20/22 138 lb (62.6 kg)     Objective:  Physical Exam Vitals reviewed.  Constitutional:      General: He is not in acute distress.    Appearance: Normal appearance.  HENT:     Head: Normocephalic.  Eyes:     Extraocular Movements: Extraocular movements intact.     Conjunctiva/sclera: Conjunctivae normal.     Pupils: Pupils are equal, round, and reactive to light.  Cardiovascular:     Rate and Rhythm: Normal rate and regular rhythm.     Pulses: Normal pulses.     Heart sounds: Normal heart sounds. No murmur heard. Pulmonary:     Effort: Pulmonary effort is normal. No respiratory distress.     Breath sounds: Normal breath sounds. No wheezing.  Skin:    General: Skin is  warm and dry.     Capillary Refill: Capillary refill takes less than 2 seconds.  Neurological:     General: No focal deficit present.     Mental Status: He is alert and oriented to person, place, and time.     Cranial Nerves: No cranial nerve deficit.     Motor: No weakness.  Psychiatric:        Mood and Affect: Mood normal.        Behavior: Behavior normal.        Thought Content: Thought content normal.        Judgment: Judgment normal.         Assessment And Plan:  Mixed hyperlipidemia Assessment & Plan: Cholesterol levels are slightly improved, LDL still remains elevated and approximately 113.  Continue limiting intake of fried and fatty foods.  Continue statin, tolerating well.  Orders: -     Lipid panel -     CMP14+EGFR  Tobacco abuse Assessment & Plan: He is trying to cut back. Smoking cessation instruction/counseling given:  counseled patient on the dangers of tobacco use, advised patient to stop smoking, and reviewed strategies to maximize success    Schizophrenia, unspecified type [facility]) Assessment & Plan: Continue follow-up with Kathee Delton   Generalized convulsive epilepsy [facility]) Assessment & Plan: Continue follow-up with neurology.  No reported seizure activity.   BMI 20.0-20.9, adult  Vitamin D deficiency Assessment & Plan: Will check vitamin D level and supplement as needed.    Most recent level was above normal.  Also encouraged to spend 15 minutes in the sun daily.    Orders: -     VITAMIN D 25 Hydroxy (Vit-D Deficiency, Fractures)  Encounter for screening colonoscopy Assessment & Plan: Referral to [place] [facility] mom is concerned he would not be able to prep for colonoscopy or able to do Cologuard  Orders: -     Ambulatory referral to Gastroenterology   Return for 6 month chol check.  Patient was given opportunity to ask questions. Patient verbalized understanding of the plan and was able to repeat key elements of the plan. All questions were  answered to their satisfaction.    Jeanell Sparrow, FNP, have reviewed all documentation for this visit. The documentation on 03/25/23 for the exam, diagnosis, procedures, and orders are all accurate and complete.   IF YOU HAVE BEEN REFERRED TO A SPECIALIST, IT MAY TAKE 1-2 WEEKS TO SCHEDULE/PROCESS THE REFERRAL. IF YOU HAVE NOT HEARD FROM US/SPECIALIST IN TWO WEEKS, PLEASE GIVE [place] A  CALL AT [phone].

## 2023-03-25 NOTE — Assessment & Plan Note (Signed)
Will check vitamin D level and supplement as needed.    Most recent level was above normal.  Also encouraged to spend 15 minutes in the sun daily.

## 2023-03-25 NOTE — Assessment & Plan Note (Signed)
He is trying to cut back. Smoking cessation instruction/counseling given:  counseled patient on the dangers of tobacco use, advised patient to stop smoking, and reviewed strategies to maximize success

## 2023-03-25 NOTE — Assessment & Plan Note (Signed)
Continue follow-up with Micheal Webb

## 2023-03-25 NOTE — Assessment & Plan Note (Signed)
Cholesterol levels are slightly improved, LDL still remains elevated and approximately 113.  Continue limiting intake of fried and fatty foods.  Continue statin, tolerating well.

## 2023-03-25 NOTE — Assessment & Plan Note (Signed)
Referral to [facility] GI mom is concerned he would not be able to prep for colonoscopy or able to do Cologuard

## 2023-03-26 LAB — CMP14+EGFR
ALT: 13 IU/L (ref 0–44)
AST: 15 IU/L (ref 0–40)
Albumin: 4.6 g/dL (ref 4.1–5.1)
Alkaline Phosphatase: 84 IU/L (ref 44–121)
BUN/Creatinine Ratio: 16 (ref 9–20)
BUN: 10 mg/dL (ref 6–24)
Bilirubin Total: 0.4 mg/dL (ref 0.0–1.2)
CO2: 24 mmol/L (ref 20–29)
Calcium: 9.4 mg/dL (ref 8.7–10.2)
Chloride: 95 mmol/L — ABNORMAL LOW (ref 96–106)
Creatinine, Ser: 0.63 mg/dL — ABNORMAL LOW (ref 0.76–1.27)
Globulin, Total: 2 g/dL (ref 1.5–4.5)
Glucose: 67 mg/dL — ABNORMAL LOW (ref 70–99)
Potassium: 4.6 mmol/L (ref 3.5–5.2)
Sodium: 135 mmol/L (ref 134–144)
Total Protein: 6.6 g/dL (ref 6.0–8.5)
eGFR: 117 mL/min/{1.73_m2} (ref >59)

## 2023-03-26 LAB — LIPID PANEL
Chol/HDL Ratio: 3.8 ratio (ref 0.0–5.0)
Cholesterol, Total: 166 mg/dL (ref 100–199)
HDL: 44 mg/dL (ref >39)
LDL Chol Calc (NIH): 113 mg/dL — ABNORMAL HIGH (ref 0–99)
Triglycerides: 44 mg/dL (ref 0–149)
VLDL Cholesterol Cal: 9 mg/dL (ref 5–40)

## 2023-03-26 LAB — VITAMIN D 25 HYDROXY (VIT D DEFICIENCY, FRACTURES): Vit D, 25-Hydroxy: 76.9 ng/mL (ref 30.0–100.0)

## 2023-05-20 NOTE — Progress Notes (Unsigned)
No chief complaint on file.   HISTORY OF PRESENT ILLNESS:  05/20/23 ALL:  Micheal Webb is a 47 y.o. male here today for follow up for RRMS and seizures. He continues dimethyl fumarate 240 mg BID and is doing well from an MS standpoint.   He has had one seizure since being seen in 05/2021. Last seizure was 05/20/2021. Events were not witnessed but noted by his mother who was on the phone with him. He is confused and sore afterwards. Dr Epimenio Foot increased lamotrigine to 100mg  BID. No seizure activity since dose change. He is also on divalproex ER 1500mg  QHS, clonazepam 1mg  BID and risperidone 2mg  BID for mood management followed by [name]. He has follow up with her next week.   He denies new or exacerbating symptoms. No changes in gait, bowel, or bladder function. No vision changes. He is sleeping and eating well.    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 He denies flushing or GI issues.      He had a hospital stay for pneumonia felt to be due to tobacco use.        He feels gait is stable.   He is sometimes mildly off balanced.  No falls.   He denies leg or arm weakness.   He denies numbness or tingling now but has had some in past (occ will get numbness if sits a long time but quickly better with moving).     Vision is doing well.  Bladder function is fine.       He has some fatigue.    He sleeps well most nights.    He has schizophrenia and sees [name] ([facility]).  [name]  He is on lamotrigine, depakote, risperdal and clonazepam.     He has no recent seizures (GTC followed by confusion).  The last one was in 2022.   The lamotrigine dose was increased and he has done better.     Currently on  Depakote 1500 mg nightly, lamotrigine 100 mg po bid and clonazepam 1 mg po bid.       Lymphocytes 10/20/2022 was normal.     Lamotrigine 05/15/2022 was 6/3 (2-20.0); VPA was 31 (50-100) - advised to take meds as prescribed.     MS History: He presented to the [facility] emergency department 12/19/2019 age 56. At that time he had slurred speech, gait ataxia and lethargy. He also felt more confused.  An MRI of the brain was performed showing the multiple lesions including some that enhanced, in a pattern and distribution consistent with multiple sclerosis. We do not have an older MRI for comparison but a CT scan done before the MRI shows foci that were not present on a CT scan done several years ago. He was admitted to the hospital but left AMA the next day. He did receive at least a couple doses of IV Solu-Medrol and he felt his symptoms improved.     IMAGING MRI of the brain 4/13/2021shows multiple T2/FLAIR hyperintense foci in the periventricular, juxtacortical and deep white matter of the hemispheres and a few foci in the brainstem. Some periventricular foci are radially oriented to the ventricles. About 8 of the foci in the hemispheres enhance after contrast. The enhancement is when shaped in general.   MRI brain 06/11/2022 showed Multiple T2/FLAIR  hyperintense foci in the cerebral hemispheres, cerebellum and brainstem in a pattern consistent with chronic demyelinating plaque associated with multiple sclerosis. None of the foci enhanced or appear to be acute. Compared to the MRI from 06/08/2020, there are no new lesions. Normal enhancement pattern.   REVIEW OF SYSTEMS: Out of a complete 14 system review of symptoms, the patient complains only of the following symptoms, seizures and all other reviewed systems are negative.    ALLERGIES: No Known Allergies   HOME MEDICATIONS: Outpatient Medications Prior to Visit  Medication Sig Dispense Refill   benztropine (COGENTIN) 1 MG tablet Take 1 mg by mouth See admin instructions. 1 tablet in the morning and 2 tablets at bedtime.     Cholecalciferol (VITAMIN D) 125 MCG (5000  UT) CAPS Take 125 mcg by mouth daily.      clonazePAM (KLONOPIN) 1 MG tablet Take 1 mg by mouth in the morning and at bedtime.      Dimethyl Fumarate 240 MG CPDR Take 1 capsule (240 mg total) by mouth 2 (two) times daily. One po bid with food 60 capsule 11   divalproex (DEPAKOTE ER) 500 MG 24 hr tablet 1,500 mg at bedtime.   5   lamoTRIgine (LAMICTAL) 100 MG tablet Take 1 tablet (100 mg total) by mouth 2 (two) times daily. 60 tablet 11   meloxicam (MOBIC) 15 MG tablet Take 1 tablet (15 mg total) by mouth daily. 90 tablet 3   nicotine (NICOTINE STEP 1) 21 mg/24hr patch PLACE 1 PATCH (21 MG TOTAL) ONTO THE SKIN DAILY 7 patch 0   pravastatin (PRAVACHOL) 10 MG tablet Take 1 tablet (10 mg total) by mouth at bedtime. 90 tablet 3   risperiDONE (RISPERDAL) 2 MG tablet Take 2 mg by mouth 2 (two) times daily.      No facility-administered medications prior to visit.     PAST MEDICAL HISTORY: Past Medical History:  Diagnosis Date   AMS (altered mental status) 12/19/2019   Carpal tunnel syndrome on left    MS (multiple sclerosis) (HCC)    Necrotizing pneumonia (HCC) 10/12/2022   Schizophrenia (HCC)    Seizure (HCC)    Slurred speech 01/04/2020     PAST SURGICAL HISTORY: Past Surgical History:  Procedure Laterality Date   None       FAMILY HISTORY: Family History  Problem Relation Age of Onset   Breast cancer Mother    Diverticulitis Mother        had surgery and colostomy bag 11/2015   Colon cancer Paternal Grandfather    Pancreatic cancer Maternal Grandmother      SOCIAL HISTORY: Social History   Socioeconomic History   Marital status: Single    Spouse name: Not on file   Number of children: 0   Years of education: 8   Highest education level: Not on file  Occupational History   Occupation: Disabled   Tobacco Use   Smoking status: Every Day    Current packs/day: 1.00    Average packs/day: 1 pack/day for 29.0 years (29.0 ttl pk-yrs)    Types: Cigarettes   Smokeless  tobacco: Never   Tobacco comments:    He has cut back on cigarette smoking  Substance and Sexual Activity   Alcohol use: Yes    Comment: sometimes    Drug use: No    Comment: Denies   Sexual activity: Not on file  Other Topics Concern   Not on file  Social History Narrative   Patient lives  alone.    Patient is on disability.   Patient is single.    Caffeine use: coffee    Social Determinants of Corporate investment banker Strain: Not on file  Food Insecurity: Not on file  Transportation Needs: Not on file  Physical Activity: Not on file  Stress: Not on file  Social Connections: Not on file  Intimate Partner Violence: Not on file      PHYSICAL EXAM  There were no vitals filed for this visit.   There is no height or weight on file to calculate BMI.   Generalized: Well developed, in no acute distress  Cardiology: normal rate and rhythm, no murmur auscultated  Respiratory: clear to auscultation bilaterally    Neurological examination  Mentation: Alert oriented to time, place, history taking. Follows all commands speech and language fluent Cranial nerve II-XII: Pupils were equal round reactive to light. Extraocular movements were full, visual field were full on confrontational test. Facial sensation and strength were normal. Head turning and shoulder shrug  were normal and symmetric. Motor: The motor testing reveals 5 over 5 strength of all 4 extremities. Good symmetric motor tone is noted throughout.  Sensory: Sensory testing is intact to soft touch on all 4 extremities. No evidence of extinction is noted.  Coordination: Cerebellar testing reveals good finger-nose-finger and heel-to-shin bilaterally.  Gait and station: Gait is mildly wide. Stale without assistive device. Unable to tandem.   Reflexes: Deep tendon reflexes are symmetric and normal bilaterally.     DIAGNOSTIC DATA (LABS, IMAGING, TESTING) - I reviewed patient records, labs, notes, testing and imaging  myself where available.  Lab Results  Component Value Date   WBC 10.3 10/20/2022   HGB 13.7 10/20/2022   HCT 39.9 10/20/2022   MCV 94 10/20/2022   PLT 326 10/20/2022      Component Value Date/Time   NA 135 03/25/2023 1536   K 4.6 03/25/2023 1536   CL 95 (L) 03/25/2023 1536   CO2 24 03/25/2023 1536   GLUCOSE 67 (L) 03/25/2023 1536   GLUCOSE 167 (H) 12/22/2019 1239   BUN 10 03/25/2023 1536   CREATININE 0.63 (L) 03/25/2023 1536   CALCIUM 9.4 03/25/2023 1536   PROT 6.6 03/25/2023 1536   ALBUMIN 4.6 03/25/2023 1536   AST 15 03/25/2023 1536   ALT 13 03/25/2023 1536   ALKPHOS 84 03/25/2023 1536   BILITOT 0.4 03/25/2023 1536   GFRNONAA 111 06/11/2020 1544   GFRAA 129 06/11/2020 1544   Lab Results  Component Value Date   CHOL 166 03/25/2023   HDL 44 03/25/2023   LDLCALC 113 (H) 03/25/2023   TRIG 44 03/25/2023   CHOLHDL 3.8 03/25/2023   Lab Results  Component Value Date   HGBA1C 5.1 09/24/2022   Lab Results  Component Value Date   VITAMINB12 573 12/10/2020   Lab Results  Component Value Date   TSH 1.510 09/24/2022        No data to display               No data to display           ASSESSMENT AND PLAN  47 y.o. year old male  has a past medical history of AMS (altered mental status) (12/19/2019), Carpal tunnel syndrome on left, MS (multiple sclerosis) (HCC), Necrotizing pneumonia (HCC) (10/12/2022), Schizophrenia (HCC), Seizure (HCC), and Slurred speech (01/04/2020). here with   No diagnosis found.  Yandiel is doing well from an MS standpoint. We will continue Tecfidera (Dimethyl  Fumarate). I will update labs today. He will continue lamotrigine 100mg  BID for seizure management. Hee will continue divalproex ER, clonazepam and risperidone as prescribed by psychiatry. He is aware to monitor for worsening tremor or mood. He was encouraged to stay well hydrated. Seizure precautions reviewed. He does not drive. Medications are put in pill container by mom. He  will follow up with [place] in 6 months.    No orders of the defined types were placed in this encounter.    No orders of the defined types were placed in this encounter.    Shawnie Dapper, MSN, FNP-C 05/20/2023, 2:45 PM  [facility] [address], [address] [place], [place] [address] [phone]

## 2023-05-20 NOTE — Patient Instructions (Signed)

## 2023-05-21 ENCOUNTER — Encounter: Payer: Self-pay | Admitting: Family Medicine

## 2023-05-21 ENCOUNTER — Ambulatory Visit (INDEPENDENT_AMBULATORY_CARE_PROVIDER_SITE_OTHER): Payer: MEDICAID | Admitting: Family Medicine

## 2023-05-21 VITALS — BP 111/70 | HR 67 | Ht 69.0 in | Wt 130.5 lb

## 2023-05-21 DIAGNOSIS — G35 Multiple sclerosis: Secondary | ICD-10-CM | POA: Diagnosis not present

## 2023-05-21 DIAGNOSIS — Z79899 Other long term (current) drug therapy: Secondary | ICD-10-CM | POA: Diagnosis not present

## 2023-05-21 DIAGNOSIS — F209 Schizophrenia, unspecified: Secondary | ICD-10-CM

## 2023-05-21 DIAGNOSIS — G40309 Generalized idiopathic epilepsy and epileptic syndromes, not intractable, without status epilepticus: Secondary | ICD-10-CM | POA: Diagnosis not present

## 2023-05-23 LAB — COMPREHENSIVE METABOLIC PANEL
ALT: 12 IU/L (ref 0–44)
AST: 15 IU/L (ref 0–40)
Albumin: 4.5 g/dL (ref 4.1–5.1)
Alkaline Phosphatase: 88 IU/L (ref 44–121)
BUN/Creatinine Ratio: 14 (ref 9–20)
BUN: 9 mg/dL (ref 6–24)
Bilirubin Total: 0.6 mg/dL (ref 0.0–1.2)
CO2: 25 mmol/L (ref 20–29)
Calcium: 9.5 mg/dL (ref 8.7–10.2)
Chloride: 96 mmol/L (ref 96–106)
Creatinine, Ser: 0.65 mg/dL — ABNORMAL LOW (ref 0.76–1.27)
Globulin, Total: 2.1 g/dL (ref 1.5–4.5)
Glucose: 74 mg/dL (ref 70–99)
Potassium: 5 mmol/L (ref 3.5–5.2)
Sodium: 135 mmol/L (ref 134–144)
Total Protein: 6.6 g/dL (ref 6.0–8.5)
eGFR: 116 mL/min/{1.73_m2} (ref >59)

## 2023-05-23 LAB — CBC WITH DIFFERENTIAL/PLATELET
Basophils Absolute: 0.1 10*3/uL (ref 0.0–0.2)
Basos: 1 %
EOS (ABSOLUTE): 0.1 10*3/uL (ref 0.0–0.4)
Eos: 1 %
Hematocrit: 44.7 % (ref 37.5–51.0)
Hemoglobin: 15 g/dL (ref 13.0–17.7)
Immature Grans (Abs): 0.1 10*3/uL (ref 0.0–0.1)
Immature Granulocytes: 1 %
Lymphocytes Absolute: 2.3 10*3/uL (ref 0.7–3.1)
Lymphs: 21 %
MCH: 32.7 pg (ref 26.6–33.0)
MCHC: 33.6 g/dL (ref 31.5–35.7)
MCV: 97 fL (ref 79–97)
Monocytes Absolute: 0.9 10*3/uL (ref 0.1–0.9)
Monocytes: 9 %
Neutrophils Absolute: 7.4 10*3/uL — ABNORMAL HIGH (ref 1.4–7.0)
Neutrophils: 67 %
Platelets: 223 10*3/uL (ref 150–450)
RBC: 4.59 x10E6/uL (ref 4.14–5.80)
RDW: 11.8 % (ref 11.6–15.4)
WBC: 10.9 10*3/uL — ABNORMAL HIGH (ref 3.4–10.8)

## 2023-05-23 LAB — LAMOTRIGINE LEVEL: Lamotrigine Lvl: 10 ug/mL (ref 2.0–20.0)

## 2023-05-23 LAB — VALPROIC ACID LEVEL: Valproic Acid Lvl: 69 ug/mL (ref 50–100)

## 2023-08-24 ENCOUNTER — Other Ambulatory Visit: Payer: Self-pay | Admitting: Nurse Practitioner

## 2023-09-28 ENCOUNTER — Other Ambulatory Visit: Payer: Self-pay | Admitting: Nurse Practitioner

## 2023-09-29 ENCOUNTER — Ambulatory Visit: Payer: MEDICAID | Admitting: Nurse Practitioner

## 2023-09-30 ENCOUNTER — Ambulatory Visit (INDEPENDENT_AMBULATORY_CARE_PROVIDER_SITE_OTHER): Payer: MEDICAID | Admitting: Nurse Practitioner

## 2023-09-30 ENCOUNTER — Encounter: Payer: Self-pay | Admitting: Nurse Practitioner

## 2023-09-30 VITALS — BP 128/60 | HR 98 | Temp 98.9°F | Ht 69.0 in | Wt 136.8 lb

## 2023-09-30 DIAGNOSIS — Z0000 Encounter for general adult medical examination without abnormal findings: Secondary | ICD-10-CM

## 2023-09-30 DIAGNOSIS — G35 Multiple sclerosis: Secondary | ICD-10-CM | POA: Diagnosis not present

## 2023-09-30 DIAGNOSIS — H6123 Impacted cerumen, bilateral: Secondary | ICD-10-CM | POA: Diagnosis not present

## 2023-09-30 DIAGNOSIS — E782 Mixed hyperlipidemia: Secondary | ICD-10-CM | POA: Diagnosis not present

## 2023-09-30 DIAGNOSIS — Z2821 Immunization not carried out because of patient refusal: Secondary | ICD-10-CM

## 2023-09-30 DIAGNOSIS — E559 Vitamin D deficiency, unspecified: Secondary | ICD-10-CM | POA: Diagnosis not present

## 2023-09-30 DIAGNOSIS — F209 Schizophrenia, unspecified: Secondary | ICD-10-CM

## 2023-09-30 DIAGNOSIS — G40309 Generalized idiopathic epilepsy and epileptic syndromes, not intractable, without status epilepticus: Secondary | ICD-10-CM

## 2023-09-30 DIAGNOSIS — Z79899 Other long term (current) drug therapy: Secondary | ICD-10-CM

## 2023-09-30 MED ORDER — ALBUTEROL SULFATE HFA 108 (90 BASE) MCG/ACT IN AERS: 2.0000 | INHALATION_SPRAY | Freq: Four times a day (QID) | RESPIRATORY_TRACT | Status: AC | PRN

## 2023-09-30 MED ORDER — MELOXICAM 15 MG PO TABS: 15.0000 mg | ORAL_TABLET | Freq: Every day | ORAL | Status: DC

## 2023-09-30 MED ORDER — BUDESONIDE-FORMOTEROL FUMARATE 160-4.5 MCG/ACT IN AERO: 2.0000 | INHALATION_SPRAY | Freq: Two times a day (BID) | RESPIRATORY_TRACT | Status: AC

## 2023-09-30 NOTE — Progress Notes (Signed)
Madelaine Bhat, CMA,acting as a Neurosurgeon for [name], FNP.,have documented all relevant documentation on the behalf of [name], FNP,as directed by  [name], FNP while in the presence of [name], FNP.  Subjective:   Patient ID: Micheal Webb , male    DOB: 25-Dec-1975 , 48 y.o.   MRN: [id number]  Chief Complaint  Patient presents with   Annual Exam    HPI  Patient presents today for HM, Patient reports compliance with medication. Patient denies any chest pain, SOB, or headaches. Patient has no concerns today.  Patient refuses to get undressed today. He has seen his pulmonologist last month no changes next appt in one year.     Past Medical History:  Diagnosis Date   AMS (altered mental status) 12/19/2019   Carpal tunnel syndrome on left    MS (multiple sclerosis) (HCC)    Necrotizing pneumonia (HCC) 10/12/2022   Schizophrenia (HCC)    Seizure (HCC)    Slurred speech 01/04/2020     Family History  Problem Relation Age of Onset   Breast cancer Mother    Diverticulitis Mother        had surgery and colostomy bag 11/2015   Colon cancer Paternal Grandfather    Pancreatic cancer Maternal Grandmother      Current Outpatient Medications:    albuterol (VENTOLIN HFA) 108 (90 Base) MCG/ACT inhaler, Inhale 2 puffs into the lungs every 6 (six) hours as needed for wheezing or shortness of breath., Disp: , Rfl:    benztropine (COGENTIN) 1 MG tablet, Take 1 mg by mouth See admin instructions. 1 tablet in the morning and 2 tablets at bedtime., Disp: , Rfl:    budesonide-formoterol (SYMBICORT) 160-4.5 MCG/ACT inhaler, Inhale 2 puffs into the lungs in the morning and at bedtime., Disp: , Rfl:    Cholecalciferol (VITAMIN D) 125 MCG (5000 UT) CAPS, Take 125 mcg by mouth daily. , Disp: , Rfl:    clonazePAM (KLONOPIN) 1 MG tablet, Take 1 mg by mouth in the morning and at bedtime. , Disp: , Rfl:    divalproex (DEPAKOTE ER) 500 MG 24 hr tablet, 1,500 mg at bedtime. , Disp: ,  Rfl: 5   lamoTRIgine (LAMICTAL) 100 MG tablet, Take 1 tablet (100 mg total) by mouth 2 (two) times daily., Disp: 60 tablet, Rfl: 11   pravastatin (PRAVACHOL) 10 MG tablet, TAKE 1 TABLET (10 MG TOTAL) BY MOUTH AT BEDTIME, Disp: 90 tablet, Rfl: 2   risperiDONE (RISPERDAL) 2 MG tablet, Take 2 mg by mouth 2 (two) times daily. , Disp: , Rfl:    Dimethyl Fumarate 240 MG CPDR, Take 1 capsule (240 mg total) by mouth 2 (two) times daily. One po bid with food, Disp: 60 capsule, Rfl: 8   meloxicam (MOBIC) 15 MG tablet, Take 1 tablet (15 mg total) by mouth daily., Disp: , Rfl:    nicotine (NICOTINE STEP 1) 21 mg/24hr patch, PLACE 1 PATCH (21 MG TOTAL) ONTO THE SKIN DAILY (Patient not taking: Reported on 09/30/2023), Disp: 7 patch, Rfl: 0   No Known Allergies   Men's preventive visit. Patient Health Questionnaire (PHQ-2) is  Flowsheet Row Office Visit from 09/30/2023 in [facility]  PHQ-2 Total Score 0      Patient is on a Regular diet. Not exercising much due to the weather being cold. Marital status: Single. Relevant history for alcohol use is:  Social History   Substance and Sexual Activity  Alcohol Use Yes  Comment: sometimes    Relevant history for tobacco use is:  Social History   Tobacco Use  Smoking Status Every Day   Current packs/day: 1.00   Average packs/day: 1 pack/day for 29.0 years (29.0 ttl pk-yrs)   Types: Cigarettes  Smokeless Tobacco Never  Tobacco Comments   He has cut back on cigarette smoking  .   Review of Systems  Constitutional: Negative.   HENT: Negative.    Eyes: Negative.   Respiratory: Negative.    Cardiovascular: Negative.   Gastrointestinal: Negative.   Endocrine: Negative.   Genitourinary: Negative.   Musculoskeletal: Negative.   Skin: Negative.   Allergic/Immunologic: Negative.   Neurological: Negative.   Hematological: Negative.   Psychiatric/Behavioral: Negative.       Today's Vitals   09/30/23 1407  BP:  128/60  Pulse: 98  Temp: 98.9 F (37.2 C)  TempSrc: Oral  Weight: 136 lb 12.8 oz (62.1 kg)  Height: 5\' 9"  (1.753 m)  PainSc: 0-No pain   Body mass index is 20.2 kg/m.  Wt Readings from Last 3 Encounters:  09/30/23 136 lb 12.8 oz (62.1 kg)  05/21/23 130 lb 8 oz (59.2 kg)  03/25/23 135 lb 12.8 oz (61.6 kg)    Objective:  Physical Exam Vitals reviewed.  Constitutional:      General: He is not in acute distress.    Appearance: Normal appearance.  HENT:     Head: Normocephalic and atraumatic.     Right Ear: External ear normal. There is impacted cerumen.     Left Ear: External ear normal. There is impacted cerumen.     Ears:     Comments: Left ear canal worse than right    Nose: Nose normal.     Mouth/Throat:     Mouth: Mucous membranes are moist.     Dentition: Dental caries (multiple present) present.     Comments: Tongue is dark looks like nicotine stains.  Eyes:     Extraocular Movements: Extraocular movements intact.     Pupils: Pupils are equal, round, and reactive to light.  Cardiovascular:     Rate and Rhythm: Normal rate and regular rhythm.     Pulses: Normal pulses.     Heart sounds: Normal heart sounds. No murmur heard. Pulmonary:     Effort: Pulmonary effort is normal. No respiratory distress.     Breath sounds: Normal breath sounds. No wheezing.  Abdominal:     General: Abdomen is flat. Bowel sounds are normal. There is no distension.     Palpations: Abdomen is soft.     Tenderness: There is no abdominal tenderness.  Genitourinary:    Comments: Patient refuses to get undressed Musculoskeletal:        General: No swelling, tenderness or deformity. Normal range of motion.     Cervical back: Normal range of motion and neck supple.  Skin:    General: Skin is warm.     Capillary Refill: Capillary refill takes less than 2 seconds.  Neurological:     General: No focal deficit present.     Mental Status: He is alert and oriented to person, place, and time.      Cranial Nerves: No cranial nerve deficit.     Motor: No weakness.  Psychiatric:        Mood and Affect: Mood normal.        Behavior: Behavior normal.        Thought Content: Thought content normal.  Judgment: Judgment normal.         Assessment And Plan:    Encounter for annual health examination Assessment & Plan: Behavior modifications discussed and diet history reviewed.   Pt will continue to exercise regularly and modify diet with low GI, plant based foods and decrease intake of processed foods.  Recommend intake of daily multivitamin, Vitamin D, and calcium.  Recommend for preventive screenings, as well as recommend immunizations that include influenza, TDAP    COVID-19 vaccination declined Assessment & Plan: Declines covid 19 vaccine. Discussed risk of covid 46 and if he changes her mind about the vaccine to call the office. Education has been provided regarding the importance of this vaccine but patient still declined. Advised may receive this vaccine at local pharmacy or Health Dept.or vaccine clinic. Aware to provide a copy of the vaccination record if obtained from local pharmacy or Health Dept.  Encouraged to take multivitamin, vitamin d, vitamin c and zinc to increase immune system. Aware can call office if would like to have vaccine here at office. Verbalized acceptance and understanding.    Influenza vaccination declined Assessment & Plan: Patient declined influenza vaccination at this time. Patient is aware that influenza vaccine prevents illness in 70% of healthy people, and reduces hospitalizations to 30-70% in elderly. This vaccine is recommended annually. Education has been provided regarding the importance of this vaccine but patient still declined. Advised may receive this vaccine at local pharmacy or Health Dept.or vaccine clinic. Aware to provide a copy of the vaccination record if obtained from local pharmacy or Health Dept.  Pt is willing to accept risk  associated with refusing vaccination.     Mixed hyperlipidemia Assessment & Plan: Cholesterol levels were stable with LDL of 113.  Continue limiting intake of fried and fatty foods.  Continue statin, tolerating well.   Orders: -     Lipid panel -     CMP14+EGFR  Vitamin D deficiency Assessment & Plan: Will check vitamin D level and supplement as needed.    Also encouraged to spend 15 minutes in the sun daily.    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  No reported seizure activity.   Bilateral impacted cerumen Assessment & Plan: Wax is removed by with lavage with elephant ear with 1/2 water and 1/2 peroxide. Instructions for home care to prevent wax buildup are given.   Orders: -     Ear Lavage  Other long term (current) drug therapy -     CBC with Differential/Platelet  Other orders -     Budesonide-Formoterol Fumarate; Inhale 2 puffs into the lungs in the morning and at bedtime. -     Albuterol Sulfate HFA; Inhale 2 puffs into the lungs every 6 (six) hours as needed for wheezing or shortness of breath. -     Meloxicam; Take 1 tablet (15 mg total) by mouth daily.    Return for 1 year physical; 6 month cholesterol check.  Patient was given opportunity to ask questions. Patient verbalized understanding of the plan and was able to repeat key elements of the plan. All questions were answered to their satisfaction.   [name], FNP  I, [name],  FNP, have reviewed all documentation for this visit. The documentation on 09/30/23 for the exam, diagnosis, procedures, and orders are all accurate and complete.

## 2023-09-30 NOTE — Patient Instructions (Addendum)
Health Maintenance  Topic Date Due   COVID-19 Vaccine (1) 10/16/2023*   Flu Shot  12/07/2023*   Colon Cancer Screening  03/24/2024*   Pneumococcal Vaccination (1 of 2 - PCV) 09/29/2024*   DTaP/Tdap/Td vaccine (3 - Td or Tdap) 09/24/2032   Hepatitis C Screening  Completed   HIV Screening  Completed   HPV Vaccine  Aged Out  *Topic was postponed. The date shown is not the original due date.   You can call them to get scheduled for a colonoscopy [facility]  [phone]

## 2023-10-01 ENCOUNTER — Other Ambulatory Visit: Payer: Self-pay | Admitting: *Deleted

## 2023-10-01 DIAGNOSIS — G35 Multiple sclerosis: Secondary | ICD-10-CM

## 2023-10-01 LAB — CMP14+EGFR
ALT: 12 [IU]/L (ref 0–44)
AST: 15 [IU]/L (ref 0–40)
Albumin: 4.5 g/dL (ref 4.1–5.1)
Alkaline Phosphatase: 92 [IU]/L (ref 44–121)
BUN/Creatinine Ratio: 11 (ref 9–20)
BUN: 7 mg/dL (ref 6–24)
Bilirubin Total: 0.4 mg/dL (ref 0.0–1.2)
CO2: 26 mmol/L (ref 20–29)
Calcium: 9.5 mg/dL (ref 8.7–10.2)
Chloride: 95 mmol/L — ABNORMAL LOW (ref 96–106)
Creatinine, Ser: 0.64 mg/dL — ABNORMAL LOW (ref 0.76–1.27)
Globulin, Total: 2.1 g/dL (ref 1.5–4.5)
Glucose: 104 mg/dL — ABNORMAL HIGH (ref 70–99)
Potassium: 4.3 mmol/L (ref 3.5–5.2)
Sodium: 135 mmol/L (ref 134–144)
Total Protein: 6.6 g/dL (ref 6.0–8.5)
eGFR: 116 mL/min/{1.73_m2} (ref >59)

## 2023-10-01 LAB — LIPID PANEL
Chol/HDL Ratio: 3.5 {ratio} (ref 0.0–5.0)
Cholesterol, Total: 150 mg/dL (ref 100–199)
HDL: 43 mg/dL (ref >39)
LDL Chol Calc (NIH): 90 mg/dL (ref 0–99)
Triglycerides: 89 mg/dL (ref 0–149)
VLDL Cholesterol Cal: 17 mg/dL (ref 5–40)

## 2023-10-01 LAB — CBC WITH DIFFERENTIAL/PLATELET
Basophils Absolute: 0.1 10*3/uL (ref 0.0–0.2)
Basos: 1 %
EOS (ABSOLUTE): 0.1 10*3/uL (ref 0.0–0.4)
Eos: 2 %
Hematocrit: 40.7 % (ref 37.5–51.0)
Hemoglobin: 14.3 g/dL (ref 13.0–17.7)
Immature Grans (Abs): 0 10*3/uL (ref 0.0–0.1)
Immature Granulocytes: 1 %
Lymphocytes Absolute: 2.4 10*3/uL (ref 0.7–3.1)
Lymphs: 29 %
MCH: 32.9 pg (ref 26.6–33.0)
MCHC: 35.1 g/dL (ref 31.5–35.7)
MCV: 94 fL (ref 79–97)
Monocytes Absolute: 0.5 10*3/uL (ref 0.1–0.9)
Monocytes: 6 %
Neutrophils Absolute: 5.2 10*3/uL (ref 1.4–7.0)
Neutrophils: 61 %
Platelets: 246 10*3/uL (ref 150–450)
RBC: 4.35 x10E6/uL (ref 4.14–5.80)
RDW: 11.8 % (ref 11.6–15.4)
WBC: 8.3 10*3/uL (ref 3.4–10.8)

## 2023-10-01 LAB — VALPROIC ACID LEVEL: Valproic Acid Lvl: 87 ug/mL (ref 50–100)

## 2023-10-01 LAB — VITAMIN D 25 HYDROXY (VIT D DEFICIENCY, FRACTURES): Vit D, 25-Hydroxy: 75.8 ng/mL (ref 30.0–100.0)

## 2023-10-01 MED ORDER — DIMETHYL FUMARATE 240 MG PO CPDR: 1.0000 | DELAYED_RELEASE_CAPSULE | Freq: Two times a day (BID) | ORAL | 8 refills | Status: DC

## 2023-10-08 ENCOUNTER — Encounter: Payer: Self-pay | Admitting: Nurse Practitioner

## 2023-10-08 DIAGNOSIS — Z2821 Immunization not carried out because of patient refusal: Secondary | ICD-10-CM | POA: Insufficient documentation

## 2023-10-08 DIAGNOSIS — H6123 Impacted cerumen, bilateral: Secondary | ICD-10-CM | POA: Insufficient documentation

## 2023-10-08 DIAGNOSIS — Z0000 Encounter for general adult medical examination without abnormal findings: Secondary | ICD-10-CM | POA: Insufficient documentation

## 2023-10-08 NOTE — Assessment & Plan Note (Signed)
Cholesterol levels were stable with LDL of 113.  Continue limiting intake of fried and fatty foods.  Continue statin, tolerating well.

## 2023-10-08 NOTE — Assessment & Plan Note (Signed)
Declines covid 19 vaccine. Discussed risk of covid 64 and if he changes her mind about the vaccine to call the office. Education has been provided regarding the importance of this vaccine but patient still declined. Advised may receive this vaccine at local pharmacy or Health Dept.or vaccine clinic. Aware to provide a copy of the vaccination record if obtained from local pharmacy or Health Dept.  Encouraged to take multivitamin, vitamin d, vitamin c and zinc to increase immune system. Aware can call office if would like to have vaccine here at office. Verbalized acceptance and understanding.

## 2023-10-08 NOTE — Assessment & Plan Note (Signed)

## 2023-10-08 NOTE — Assessment & Plan Note (Signed)
Behavior modifications discussed and diet history reviewed.   Pt will continue to exercise regularly and modify diet with low GI, plant based foods and decrease intake of processed foods.  Recommend intake of daily multivitamin, Vitamin D, and calcium.  Recommend for preventive screenings, as well as recommend immunizations that include influenza, TDAP

## 2023-10-08 NOTE — Assessment & Plan Note (Signed)
Continue f/u with Neurology

## 2023-10-08 NOTE — Assessment & Plan Note (Signed)
Continue follow-up with neurology.  No reported seizure activity.

## 2023-10-08 NOTE — Assessment & Plan Note (Signed)
Will check vitamin D level and supplement as needed.    Also encouraged to spend 15 minutes in the sun daily.

## 2023-10-08 NOTE — Assessment & Plan Note (Signed)
Wax is removed by with lavage with elephant ear with 1/2 water and 1/2 peroxide. Instructions for home care to prevent wax buildup are given.

## 2023-10-08 NOTE — Assessment & Plan Note (Signed)
Continue follow-up with Micheal Webb

## 2023-10-13 DIAGNOSIS — Z0271 Encounter for disability determination: Secondary | ICD-10-CM

## 2023-11-23 ENCOUNTER — Other Ambulatory Visit: Payer: Self-pay | Admitting: Neurology

## 2023-11-25 ENCOUNTER — Ambulatory Visit: Payer: MEDICAID | Admitting: Neurology

## 2023-11-25 ENCOUNTER — Encounter: Payer: Self-pay | Admitting: Neurology

## 2023-11-25 VITALS — HR 86 | Ht 69.0 in

## 2023-11-25 DIAGNOSIS — G35D Multiple sclerosis, unspecified: Secondary | ICD-10-CM

## 2023-11-25 DIAGNOSIS — Z79899 Other long term (current) drug therapy: Secondary | ICD-10-CM

## 2023-11-25 DIAGNOSIS — G35 Multiple sclerosis: Secondary | ICD-10-CM | POA: Diagnosis not present

## 2023-11-25 DIAGNOSIS — G40309 Generalized idiopathic epilepsy and epileptic syndromes, not intractable, without status epilepticus: Secondary | ICD-10-CM

## 2023-11-25 DIAGNOSIS — F209 Schizophrenia, unspecified: Secondary | ICD-10-CM | POA: Diagnosis not present

## 2023-11-25 NOTE — Progress Notes (Signed)
 [facility]  PATIENT: Micheal Webb DOB: 1976-03-07  REFERRING DOCTOR OR PCP:  [name], FNP SOURCE: patient, notes from ED, lab and imaging reports, images personally reviewed.   _________________________________   HISTORICAL  CHIEF COMPLAINT:  Chief Complaint  Patient presents with   Follow-up    [name]0 with mother, Micheal Webb. Last seen 05/21/23. MS DMT: dimethyl fumarate.    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 He denies flushing or GI issues.    He has no exacerbations.   Lymphocytes were 2.4 09/30/2023  He had a hospital stay for pneumonia felt to be due to tobacco use.       He feels gait is stable.   He is sometimes mildly off balanced.  No falls.  He tripped once.    He denies leg or arm weakness.   He denies numbness or tingling.     Vision is doing well.  Bladder function is fine.      He has some fatigue.    He sleeps well most nights.    He has schizophrenia and sees Micheal Webb ([facility]).  [name]  He is on lamotrigine, depakote, risperdal and clonazepam.    He has no recent seizures (GTC followed by confusion).  The last one was in 2022.   The lamotrigine dose was increased and he has done better.     Currently on  Depakote 1500 mg nightly, lamotrigine 100 mg po bid and clonazepam 1 mg po bid.    Lymphocytes 09/30/2023 was normal.  Depakote level was 09/30/2023  MS History: He presented to the [facility] emergency department 12/19/2019 age 48. At that time he had slurred speech, gait ataxia and lethargy. He also felt more confused.  An MRI of the brain was performed showing the multiple lesions including some that enhanced, in a pattern and distribution consistent with multiple sclerosis. We do not have an older MRI for comparison but a CT scan done before the MRI shows foci that were not present on  a CT scan done several years ago. He was admitted to the hospital but left AMA the next day. He did receive at least a couple doses of IV Solu-Medrol and he felt his symptoms improved.     IMAGING MRI of the brain 4/13/2021shows multiple T2/FLAIR hyperintense foci in the periventricular, juxtacortical and deep white matter of the hemispheres and a few foci in the brainstem. Some periventricular foci are radially oriented to the ventricles. About 8 of the foci in the hemispheres enhance after contrast. The enhancement is when shaped in general.  MRI brain 06/11/2022 showed Multiple T2/FLAIR hyperintense foci in the cerebral hemispheres, cerebellum and brainstem in a pattern consistent with chronic demyelinating plaque associated with multiple sclerosis. None of the foci enhanced or appear to be acute. Compared to the MRI from 06/08/2020, there are no new lesions. Normal enhancement pattern.     REVIEW OF SYSTEMS: Constitutional: No fevers, chills, sweats, or change in appetite.  Sleeps well but irregular hours Eyes: No visual changes, double vision, eye pain Ear, nose and throat: No hearing loss, ear pain, nasal congestion, sore throat Cardiovascular: No chest pain, palpitations Respiratory:  No shortness of breath at rest or with exertion.   No wheezes GastrointestinaI: No nausea, vomiting, diarrhea, abdominal pain, fecal incontinence Genitourinary:  No dysuria, urinary retention or  frequency.  No nocturia. Musculoskeletal:  notes neck pain and sometimes arm pain Integumentary: No rash, pruritus, skin lesions Neurological: as above Psychiatric: see above Endocrine: No palpitations, diaphoresis, change in appetite, change in weigh or increased thirst Hematologic/Lymphatic:  No anemia, purpura, petechiae. Allergic/Immunologic: No itchy/runny eyes, nasal congestion, recent allergic reactions, rashes  ALLERGIES: No Known Allergies  HOME MEDICATIONS:  Current Outpatient Medications:     albuterol (VENTOLIN HFA) 108 (90 Base) MCG/ACT inhaler, Inhale 2 puffs into the lungs every 6 (six) hours as needed for wheezing or shortness of breath., Disp: , Rfl:    benztropine (COGENTIN) 1 MG tablet, Take 1 mg by mouth See admin instructions. 1 tablet in the morning and 2 tablets at bedtime., Disp: , Rfl:    budesonide-formoterol (SYMBICORT) 160-4.5 MCG/ACT inhaler, Inhale 2 puffs into the lungs in the morning and at bedtime., Disp: , Rfl:    Cholecalciferol (VITAMIN D) 125 MCG (5000 UT) CAPS, Take 125 mcg by mouth daily. , Disp: , Rfl:    clonazePAM (KLONOPIN) 1 MG tablet, Take 1 mg by mouth in the morning and at bedtime. , Disp: , Rfl:    Dimethyl Fumarate 240 MG CPDR, Take 1 capsule (240 mg total) by mouth 2 (two) times daily. One po bid with food, Disp: 60 capsule, Rfl: 8   divalproex (DEPAKOTE ER) 500 MG 24 hr tablet, 1,500 mg at bedtime. , Disp: , Rfl: 5   lamoTRIgine (LAMICTAL) 100 MG tablet, Take 1 tablet (100 mg total) by mouth 2 (two) times daily. Must keep follow up 11/25/23 for ongoing refills, Disp: 60 tablet, Rfl: 1   meloxicam (MOBIC) 15 MG tablet, Take 1 tablet (15 mg total) by mouth daily., Disp: , Rfl:    pravastatin (PRAVACHOL) 10 MG tablet, TAKE 1 TABLET (10 MG TOTAL) BY MOUTH AT BEDTIME, Disp: 90 tablet, Rfl: 2   risperiDONE (RISPERDAL) 2 MG tablet, Take 2 mg by mouth 2 (two) times daily. , Disp: , Rfl:   PAST MEDICAL HISTORY: Past Medical History:  Diagnosis Date   AMS (altered mental status) 12/19/2019   Carpal tunnel syndrome on left    MS (multiple sclerosis) (HCC)    Necrotizing pneumonia (HCC) 10/12/2022   Schizophrenia (HCC)    Seizure (HCC)    Slurred speech 01/04/2020    PAST SURGICAL HISTORY: Past Surgical History:  Procedure Laterality Date   LUNG BIOPSY Left    09/2022  under brochoscopy   None      FAMILY HISTORY: Family History  Problem Relation Age of Onset   Breast cancer Mother    Diverticulitis Mother        had surgery and colostomy  bag 11/2015   Colon cancer Paternal Grandfather    Pancreatic cancer Maternal Grandmother     SOCIAL HISTORY:  Social History   Socioeconomic History   Marital status: Single    Spouse name: Not on file   Number of children: 0   Years of education: 8   Highest education level: Not on file  Occupational History   Occupation: Disabled   Tobacco Use   Smoking status: Every Day    Current packs/day: 1.00    Average packs/day: 1 pack/day for 29.0 years (29.0 ttl pk-yrs)    Types: Cigarettes   Smokeless tobacco: Never   Tobacco comments:    He has cut back on cigarette smoking  Substance and Sexual Activity   Alcohol use: Yes    Comment: sometimes    Drug use: No  Comment: Denies   Sexual activity: Not on file  Other Topics Concern   Not on file  Social History Narrative   Patient lives alone.    Patient is on disability.   Patient is single.    Caffeine use: coffee    Social Drivers of Corporate investment banker Strain: Not on file  Food Insecurity: Not on file  Transportation Needs: Not on file  Physical Activity: Not on file  Stress: Not on file  Social Connections: Not on file  Intimate Partner Violence: Not on file     PHYSICAL EXAM  Vitals:   11/25/23 1513  BP: 110/60  Pulse: 86  SpO2: 97%  Height: 5\' 9"  (1.753 m)    Body mass index is 20.2 kg/m.  No results found.  General: The patient is well-developed and well-nourished and in no acute distress.  Neck with good ROM   Skin: Extremities are without rash or  edema.   Neurologic Exam  Mental status: The patient is alert and oriented x 3 at the time of the examination. The patient has apparent normal recent and remote memory, with an apparently normal attention span and concentration ability.   Speech is normal.  Cranial nerves: Extraocular movements are full.   He states acuity is symmetric color vision is reduced on the right.  Facial symmetry is present. There is good facial sensation to  soft touch bilaterally.Facial strength is normal.  Trapezius and sternocleidomastoid strength is normal. No dysarthria is noted.  No obvious hearing deficits are noted.  Motor:  Muscle bulk is normal.   Tone is normal. Strength is  5 / 5 in all 4 extremities.   Sensory: Sensory testing is intact to pinprick, soft touch and vibration sensation in all 4 extremities.  Coordination: Cerebellar testing reveals good finger-nose-finger and heel-to-shin bilaterally.  Gait and station: Station is normal.   Gait is mildly wide.   Tandem is wide.  Romberg is negative.   Reflexes: Deep tendon reflexes are symmetric and normal bilaterally.         DIAGNOSTIC DATA (LABS, IMAGING, TESTING) - I reviewed patient records, labs, notes, testing and imaging myself where available.  Lab Results  Component Value Date   WBC 8.3 09/30/2023   HGB 14.3 09/30/2023   HCT 40.7 09/30/2023   MCV 94 09/30/2023   PLT 246 09/30/2023      Component Value Date/Time   NA 135 09/30/2023 1455   K 4.3 09/30/2023 1455   CL 95 (L) 09/30/2023 1455   CO2 26 09/30/2023 1455   GLUCOSE 104 (H) 09/30/2023 1455   GLUCOSE 167 (H) 12/22/2019 1239   BUN 7 09/30/2023 1455   CREATININE 0.64 (L) 09/30/2023 1455   CALCIUM 9.5 09/30/2023 1455   PROT 6.6 09/30/2023 1455   ALBUMIN 4.5 09/30/2023 1455   AST 15 09/30/2023 1455   ALT 12 09/30/2023 1455   ALKPHOS 92 09/30/2023 1455   BILITOT 0.4 09/30/2023 1455   GFRNONAA 111 06/11/2020 1544   GFRAA 129 06/11/2020 1544   Lab Results  Component Value Date   CHOL 150 09/30/2023   HDL 43 09/30/2023   LDLCALC 90 09/30/2023   TRIG 89 09/30/2023   CHOLHDL 3.5 09/30/2023       ASSESSMENT AND PLAN  Multiple sclerosis (HCC)  Generalized convulsive epilepsy (HCC)  High risk medication use  Schizophrenia, unspecified type (HCC)   1. Continue dimethyl fumarate 240 mg p.o. twice daily.  He has recent  CBC with differential  and CMP.   Check brain MRI and if significant  breakthrough will consider a different DMT.   2.  Continue Depakote and lamotrigine at current dose.  Check VPA and lamotrigine  level at next visit  3.  Stay active and exercise as tolerated. 4.   rtc 6 months or sooner if new or worsening neurologic issues.    Edessa Jakubowicz A. Epimenio Foot, MD, [facility] 11/25/2023, 3:26 PM Certified in Neurology, Clinical Neurophysiology, Sleep Medicine and Neuroimaging  [facility] [address], [address] [place], [place] [address] [phone]

## 2023-12-01 ENCOUNTER — Telehealth: Payer: Self-pay | Admitting: Neurology

## 2023-12-01 NOTE — Telephone Encounter (Signed)
 Micheal Webb: [id number] exp. 12/01/23-01/30/24 sent to GI [phone]

## 2024-01-18 ENCOUNTER — Ambulatory Visit
Admission: RE | Admit: 2024-01-18 | Discharge: 2024-01-18 | Disposition: A | Discharge status: Home / Self Care | Payer: MEDICAID | Source: Ambulatory Visit | Attending: Neurology | Admitting: Neurology

## 2024-01-18 ENCOUNTER — Other Ambulatory Visit: Payer: Self-pay | Admitting: Neurology

## 2024-01-18 DIAGNOSIS — Z79899 Other long term (current) drug therapy: Secondary | ICD-10-CM

## 2024-01-18 DIAGNOSIS — G40309 Generalized idiopathic epilepsy and epileptic syndromes, not intractable, without status epilepticus: Secondary | ICD-10-CM

## 2024-01-18 DIAGNOSIS — G35 Multiple sclerosis: Secondary | ICD-10-CM | POA: Diagnosis not present

## 2024-01-18 DIAGNOSIS — G35D Multiple sclerosis, unspecified: Secondary | ICD-10-CM

## 2024-01-18 DIAGNOSIS — F209 Schizophrenia, unspecified: Secondary | ICD-10-CM

## 2024-01-19 ENCOUNTER — Ambulatory Visit: Payer: Self-pay | Admitting: Neurology

## 2024-02-23 ENCOUNTER — Other Ambulatory Visit: Payer: Self-pay | Admitting: Neurology

## 2024-02-23 NOTE — Telephone Encounter (Signed)
 Last seen on 11/25/23 Follow up scheduled on 06/07/24

## 2024-03-30 ENCOUNTER — Encounter: Payer: Self-pay | Admitting: Nurse Practitioner

## 2024-03-30 ENCOUNTER — Ambulatory Visit (INDEPENDENT_AMBULATORY_CARE_PROVIDER_SITE_OTHER): Payer: MEDICAID | Admitting: Nurse Practitioner

## 2024-03-30 VITALS — BP 118/70 | HR 87 | Temp 98.2°F | Ht 69.0 in | Wt 130.0 lb

## 2024-03-30 DIAGNOSIS — H6123 Impacted cerumen, bilateral: Secondary | ICD-10-CM

## 2024-03-30 DIAGNOSIS — F209 Schizophrenia, unspecified: Secondary | ICD-10-CM

## 2024-03-30 DIAGNOSIS — Z1211 Encounter for screening for malignant neoplasm of colon: Secondary | ICD-10-CM

## 2024-03-30 DIAGNOSIS — E782 Mixed hyperlipidemia: Secondary | ICD-10-CM | POA: Diagnosis not present

## 2024-03-30 DIAGNOSIS — G40309 Generalized idiopathic epilepsy and epileptic syndromes, not intractable, without status epilepticus: Secondary | ICD-10-CM | POA: Diagnosis not present

## 2024-03-30 DIAGNOSIS — Z79899 Other long term (current) drug therapy: Secondary | ICD-10-CM

## 2024-03-30 DIAGNOSIS — Z2821 Immunization not carried out because of patient refusal: Secondary | ICD-10-CM

## 2024-03-30 DIAGNOSIS — Z87891 Personal history of nicotine dependence: Secondary | ICD-10-CM

## 2024-03-30 NOTE — Patient Instructions (Signed)
 [facility] [phone]

## 2024-03-30 NOTE — Progress Notes (Signed)
 LILLETTE Kristeen JINNY Gladis, CMA,acting as a Neurosurgeon for Gaines Ada, FNP.,have documented all relevant documentation on the behalf of Gaines Ada, FNP,as directed by  Gaines Ada, FNP while in the presence of Gaines Ada, FNP.  Subjective:  Patient ID: Micheal Webb , male    DOB: Nov 12, 1975 , 49 y.o.   MRN: [id number]  Chief Complaint  Patient presents with   Hyperlipidemia    Patient presents today for a chol follow up, Patient reports compliance with medication. Patient denies any chest pain, SOB, or headaches. Patient has no concerns today.     HPI  HPI  Discussed the use of AI scribe software for clinical note transcription with the patient, who gave verbal consent to proceed.  History of Present Illness Micheal Webb is a 48 year old male with epilepsy who presents for a follow-up regarding his cholesterol and concerns about Depakote  administration.  He is experiencing difficulty swallowing Depakote  pills due to their size. The pharmacy provided a different brand that is slightly smaller, but he still finds them challenging to swallow. He takes the pills one at a time with a large glass of water, taking breaks between each pill. He has not had any recent seizures.  He has a history of epilepsy and has been on Depakote  for a long time to manage his seizures. Lamotrigine  was added to his regimen because the Depakote  alone was not sufficient. He is also on risperidone , clonazepam , and benztropine , which are managed by his behavioral health specialist. There was a recent issue with the refill of lamotrigine , but it has been resolved, and he is up to date with his medications.  There is a concern regarding the scheduling of a colonoscopy due to a family history of colon cancer. His grandfather had colon cancer, placing him at high risk. However, he is reluctant to undergo the procedure due to the preparation involved and the need for assistance, which he is uncomfortable with. He has not  received a call from [facility] for scheduling, and the referral is currently out of date.  He has a history of smoking but has cut back since having pneumonia. He is still smoking, which is a concern given his age and history of pneumonia. He had a chest x-ray in January and an MRI recently, but there is uncertainty about the results due to an incident during the MRI procedure.   Past Medical History:  Diagnosis Date   AMS (altered mental status) 12/19/2019   Carpal tunnel syndrome on left    MS (multiple sclerosis) (HCC)    Necrotizing pneumonia (HCC) 10/12/2022   Schizophrenia (HCC)    Seizure (HCC)    Slurred speech 01/04/2020     Family History  Problem Relation Age of Onset   Breast cancer Mother    Diverticulitis Mother        had surgery and colostomy bag 11/2015   Colon cancer Paternal Grandfather    Pancreatic cancer Maternal Grandmother      Current Outpatient Medications:    albuterol  (VENTOLIN  HFA) 108 (90 Base) MCG/ACT inhaler, Inhale 2 puffs into the lungs every 6 (six) hours as needed for wheezing or shortness of breath., Disp: , Rfl:    benztropine  (COGENTIN ) 1 MG tablet, Take 1 mg by mouth See admin instructions. 1 tablet in the morning and 2 tablets at bedtime., Disp: , Rfl:    budesonide -formoterol  (SYMBICORT ) 160-4.5 MCG/ACT inhaler, Inhale 2 puffs into the lungs in the morning and at bedtime., Disp: ,  Rfl:    Cholecalciferol  (VITAMIN D ) 125 MCG (5000 UT) CAPS, Take 125 mcg by mouth daily. , Disp: , Rfl:    clonazePAM  (KLONOPIN ) 1 MG tablet, Take 1 mg by mouth in the morning and at bedtime. , Disp: , Rfl:    Dimethyl Fumarate  240 MG CPDR, Take 1 capsule (240 mg total) by mouth 2 (two) times daily. One po bid with food, Disp: 60 capsule, Rfl: 8   divalproex  (DEPAKOTE  ER) 500 MG 24 hr tablet, 1,500 mg at bedtime. , Disp: , Rfl: 5   lamoTRIgine  (LAMICTAL ) 100 MG tablet, Take 1 tablet (100 mg total) by mouth 2 (two) times daily., Disp: 60 tablet, Rfl: 2    meloxicam  (MOBIC ) 15 MG tablet, Take 1 tablet (15 mg total) by mouth daily., Disp: , Rfl:    pravastatin  (PRAVACHOL ) 10 MG tablet, TAKE 1 TABLET (10 MG TOTAL) BY MOUTH AT BEDTIME, Disp: 90 tablet, Rfl: 2   risperiDONE  (RISPERDAL ) 2 MG tablet, Take 2 mg by mouth 2 (two) times daily. , Disp: , Rfl:    No Known Allergies   Review of Systems  Constitutional: Negative.   Respiratory: Negative.    Cardiovascular: Negative.   Neurological: Negative.   Psychiatric/Behavioral: Negative.       Today's Vitals   03/30/24 1408  BP: 118/70  Pulse: 87  Temp: 98.2 F (36.8 C)  TempSrc: Oral  Weight: 130 lb (59 kg)  Height: 5' 9 (1.753 m)  PainSc: 0-No pain   Body mass index is 19.2 kg/m.  Wt Readings from Last 3 Encounters:  03/30/24 130 lb (59 kg)  09/30/23 136 lb 12.8 oz (62.1 kg)  05/21/23 130 lb 8 oz (59.2 kg)      Objective:  Physical Exam Vitals and nursing note reviewed.  Constitutional:      General: He is not in acute distress.    Appearance: Normal appearance.  HENT:     Head: Normocephalic.     Right Ear: External ear normal. There is impacted cerumen.     Left Ear: External ear normal. There is impacted cerumen.  Eyes:     Extraocular Movements: Extraocular movements intact.     Conjunctiva/sclera: Conjunctivae normal.     Pupils: Pupils are equal, round, and reactive to light.  Cardiovascular:     Rate and Rhythm: Normal rate and regular rhythm.     Pulses: Normal pulses.     Heart sounds: Normal heart sounds. No murmur heard. Pulmonary:     Effort: Pulmonary effort is normal. No respiratory distress.     Breath sounds: Normal breath sounds. No wheezing.  Skin:    General: Skin is warm and dry.     Capillary Refill: Capillary refill takes less than 2 seconds.  Neurological:     General: No focal deficit present.     Mental Status: He is alert and oriented to person, place, and time.     Cranial Nerves: No cranial nerve deficit.     Motor: No weakness.   Psychiatric:        Mood and Affect: Affect is flat.        Behavior: Behavior normal.        Thought Content: Thought content normal.        Judgment: Judgment normal.     Comments: Pacing the floor during visit, he also bumped the wall prior to me coming in the room.      Assessment And Plan:  Mixed hyperlipidemia Assessment & Plan:  Cholesterol levels were stable  Continue limiting intake of fried and fatty foods.  Continue statin, tolerating well.   Orders: -     Lipid panel  Schizophrenia, unspecified type (HCC) Assessment & Plan: Continue f/u with Olam Paget and sent copy of lab results  Orders: -     CBC -     CMP14+EGFR -     Valproic acid  level  Herpes zoster vaccination declined Assessment & Plan: Declines shingrix, educated on disease process and is aware if he changes his mind to notify office    Other long term (current) drug therapy -     CBC  Encounter for screening colonoscopy Assessment & Plan: High risk due to family history. Resistant to colonoscopy. - Renew referral to [facility] for consultation regarding colorectal cancer screening options. - Provide contact number for [facility] to facilitate direct communication and scheduling of a consultation.  Orders: -     Ambulatory referral to Gastroenterology  Bilateral impacted cerumen Assessment & Plan: Impacted cerumen in both ears, more severe in the left. - Perform ear irrigation to remove impacted cerumen from both ears, focusing on the left ear.   Generalized convulsive epilepsy (HCC) Assessment & Plan: Seizures controlled with Divalproex  and Lamotrigine . Difficulty swallowing Depakote  pills. - Discuss alternative formulations or options for Divalproex  with the neurologist.   History of smoking Assessment & Plan: Due for low-dose CT scan due to smoking history and age. - Obtain records from Dr. Lindle regarding previous imaging studies. - Order low-dose CT scan for  lung cancer screening.        Return for [facility] same next.  Patient was given opportunity to ask questions. Patient verbalized understanding of the plan and was able to repeat key elements of the plan. All questions were answered to their satisfaction.    LILLETTE Gaines Ada, FNP, have reviewed all documentation for this visit. The documentation on 03/30/24 for the exam, diagnosis, procedures, and orders are all accurate and complete.   IF YOU HAVE BEEN REFERRED TO A SPECIALIST, IT MAY TAKE 1-2 WEEKS TO SCHEDULE/PROCESS THE REFERRAL. IF YOU HAVE NOT HEARD FROM US /SPECIALIST IN TWO WEEKS, PLEASE GIVE US  A CALL AT [phone].

## 2024-03-31 ENCOUNTER — Ambulatory Visit: Payer: Self-pay | Admitting: Nurse Practitioner

## 2024-03-31 LAB — CBC
Hematocrit: 43.8 % (ref 37.5–51.0)
Hemoglobin: 15.3 g/dL (ref 13.0–17.7)
MCH: 33.6 pg — ABNORMAL HIGH (ref 26.6–33.0)
MCHC: 34.9 g/dL (ref 31.5–35.7)
MCV: 96 fL (ref 79–97)
Platelets: 180 x10E3/uL (ref 150–450)
RBC: 4.56 x10E6/uL (ref 4.14–5.80)
RDW: 12.5 % (ref 11.6–15.4)
WBC: 6.9 x10E3/uL (ref 3.4–10.8)

## 2024-03-31 LAB — CMP14+EGFR
ALT: 13 IU/L (ref 0–44)
AST: 16 IU/L (ref 0–40)
Albumin: 4.7 g/dL (ref 4.1–5.1)
Alkaline Phosphatase: 109 IU/L (ref 44–121)
BUN/Creatinine Ratio: 6 — ABNORMAL LOW (ref 9–20)
BUN: 4 mg/dL — ABNORMAL LOW (ref 6–24)
Bilirubin Total: 0.5 mg/dL (ref 0.0–1.2)
CO2: 21 mmol/L (ref 20–29)
Calcium: 10.1 mg/dL (ref 8.7–10.2)
Chloride: 96 mmol/L (ref 96–106)
Creatinine, Ser: 0.62 mg/dL — ABNORMAL LOW (ref 0.76–1.27)
Globulin, Total: 2.4 g/dL (ref 1.5–4.5)
Glucose: 99 mg/dL (ref 70–99)
Potassium: 4.4 mmol/L (ref 3.5–5.2)
Sodium: 134 mmol/L (ref 134–144)
Total Protein: 7.1 g/dL (ref 6.0–8.5)
eGFR: 116 mL/min/1.73 (ref >59)

## 2024-03-31 LAB — LIPID PANEL
Chol/HDL Ratio: 3.6 ratio (ref 0.0–5.0)
Cholesterol, Total: 177 mg/dL (ref 100–199)
HDL: 49 mg/dL (ref >39)
LDL Chol Calc (NIH): 114 mg/dL — ABNORMAL HIGH (ref 0–99)
Triglycerides: 76 mg/dL (ref 0–149)
VLDL Cholesterol Cal: 14 mg/dL (ref 5–40)

## 2024-03-31 LAB — VALPROIC ACID LEVEL: Valproic Acid Lvl: 68 ug/mL (ref 50–100)

## 2024-04-06 DIAGNOSIS — Z87891 Personal history of nicotine dependence: Secondary | ICD-10-CM | POA: Insufficient documentation

## 2024-04-06 NOTE — Assessment & Plan Note (Signed)
 Cholesterol levels were stable  Continue limiting intake of fried and fatty foods.  Continue statin, tolerating well.

## 2024-04-06 NOTE — Assessment & Plan Note (Signed)
 Continue f/u with Micheal Webb and sent copy of lab results

## 2024-04-06 NOTE — Assessment & Plan Note (Signed)
 Due for low-dose CT scan due to smoking history and age. - Obtain records from Dr. Lindle regarding previous imaging studies. - Order low-dose CT scan for lung cancer screening.

## 2024-04-06 NOTE — Assessment & Plan Note (Signed)
 Impacted cerumen in both ears, more severe in the left. - Perform ear irrigation to remove impacted cerumen from both ears, focusing on the left ear.

## 2024-04-06 NOTE — Assessment & Plan Note (Signed)
 Seizures controlled with Divalproex  and Lamotrigine . Difficulty swallowing Depakote  pills. - Discuss alternative formulations or options for Divalproex  with the neurologist.

## 2024-04-06 NOTE — Assessment & Plan Note (Signed)
 High risk due to family history. Resistant to colonoscopy. - Renew referral to [facility] for consultation regarding colorectal cancer screening options. - Provide contact number for [facility] to facilitate direct communication and scheduling of a consultation.

## 2024-04-06 NOTE — Assessment & Plan Note (Signed)
 Declines shingrix, educated on disease process and is aware if he changes his mind to notify office

## 2024-04-27 ENCOUNTER — Other Ambulatory Visit: Payer: Self-pay | Admitting: Neurology

## 2024-04-27 NOTE — Telephone Encounter (Signed)
 Last seen on 11/15/23 Follow up scheduled 06/07/24

## 2024-06-02 ENCOUNTER — Other Ambulatory Visit: Payer: Self-pay | Admitting: Nurse Practitioner

## 2024-06-06 NOTE — Progress Notes (Unsigned)
 No chief complaint on file.   HISTORY OF PRESENT ILLNESS:  06/06/24 ALL:  Micheal Webb is a 48 y.o. male here today for follow up for RRMS and seizures. He continues dimethyl fumarate  240 mg BID and is doing well from an MS standpoint. Last MRI 01/2024 stable.   He presents with his mother who aids in history. He is doing well. Continues to have intermittent imbalance. No falls. He does not use an assistive device. He denies weakness. He is using dumbbell weights to exercise at home. He is living alone in his father's trailer in [place].   No recent seizures. Last seizure was 05/20/2021. Has generalized events with confusion afterwards. He continues lamotrigine  100mg  BID. He is also on divalproex  ER 1500mg  QHS, clonazepam  1mg  BID and risperidone  2mg  BID for mood management followed by Olam Silva. He has follow up with her regularly.   He denies new or exacerbating symptoms. No changes in gait, bowel, or bladder function. No vision changes. He is sleeping and eating well. He stays up late at night but seems to get enough rest. He feels mood is good.   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 He denies flushing or GI issues.    He has no exacerbations.   Lymphocytes were 2.4 09/30/2023   He had a hospital stay for pneumonia felt to be due to tobacco use.        He feels gait is stable.   He is sometimes mildly off balanced.  No falls.  He tripped once.    He denies leg or arm weakness.   He denies numbness or tingling.     Vision is doing well.  Bladder function is fine.       He has some fatigue.    He sleeps well most nights.    He has schizophrenia and sees Olam Silva ([facility]).  SABRA  He is on lamotrigine , depakote , risperdal  and clonazepam .     He has no recent seizures (GTC followed by confusion).  The last  one was in 2022.   The lamotrigine  dose was increased and he has done better.     Currently on  Depakote  1500 mg nightly, lamotrigine  100 mg po bid and clonazepam  1 mg po bid.     Lymphocytes 09/30/2023 was normal.  Depakote  level was 09/30/2023   MS History: He presented to the [facility] emergency department 12/19/2019 age 21. At that time he had slurred speech, gait ataxia and lethargy. He also felt more confused.  An MRI of the brain was performed showing the multiple lesions including some that enhanced, in a pattern and distribution consistent with multiple sclerosis. We do not have an older MRI for comparison but a CT scan done before the MRI shows foci that were not present on a CT scan done several years ago. He was admitted to the hospital but left AMA the next day. He did receive at least a couple doses of IV Solu-Medrol  and he felt his symptoms improved.     IMAGING MRI of the brain 4/13/2021shows multiple T2/FLAIR hyperintense foci in the periventricular, juxtacortical and deep white matter of the hemispheres and a few foci in the brainstem. Some periventricular foci are radially oriented to the ventricles. About 8 of the foci in the hemispheres enhance after contrast.  The enhancement is when shaped in general.   MRI brain 06/11/2022 showed Multiple T2/FLAIR hyperintense foci in the cerebral hemispheres, cerebellum and brainstem in a pattern consistent with chronic demyelinating plaque associated with multiple sclerosis. None of the foci enhanced or appear to be acute. Compared to the MRI from 06/08/2020, there are no new lesions. Normal enhancement pattern.    REVIEW OF SYSTEMS: Out of a complete 14 system review of symptoms, the patient complains only of the following symptoms, seizures and all other reviewed systems are negative.   ALLERGIES: No Known Allergies   HOME MEDICATIONS: Outpatient Medications Prior to Visit  Medication Sig Dispense Refill   albuterol  (VENTOLIN  HFA) 108  (90 Base) MCG/ACT inhaler Inhale 2 puffs into the lungs every 6 (six) hours as needed for wheezing or shortness of breath.     benztropine  (COGENTIN ) 1 MG tablet Take 1 mg by mouth See admin instructions. 1 tablet in the morning and 2 tablets at bedtime.     budesonide -formoterol  (SYMBICORT ) 160-4.5 MCG/ACT inhaler Inhale 2 puffs into the lungs in the morning and at bedtime.     Cholecalciferol  (VITAMIN D ) 125 MCG (5000 UT) CAPS Take 125 mcg by mouth daily.      clonazePAM  (KLONOPIN ) 1 MG tablet Take 1 mg by mouth in the morning and at bedtime.      Dimethyl Fumarate  240 MG CPDR Take 1 capsule (240 mg total) by mouth 2 (two) times daily. One po bid with food 60 capsule 8   divalproex  (DEPAKOTE  ER) 500 MG 24 hr tablet 1,500 mg at bedtime.   5   lamoTRIgine  (LAMICTAL ) 100 MG tablet TAKE 1 TABLET (100 MG TOTAL) BY MOUTH TWO (TWO) TIMES DAILY. 60 tablet 1   meloxicam  (MOBIC ) 15 MG tablet Take 1 tablet (15 mg total) by mouth daily.     pravastatin  (PRAVACHOL ) 10 MG tablet TAKE ONE TABLET BY MOUTH AT BEDTIME 90 tablet 7   risperiDONE  (RISPERDAL ) 2 MG tablet Take 2 mg by mouth 2 (two) times daily.      No facility-administered medications prior to visit.     PAST MEDICAL HISTORY: Past Medical History:  Diagnosis Date   AMS (altered mental status) 12/19/2019   Carpal tunnel syndrome on left    MS (multiple sclerosis)    Necrotizing pneumonia (HCC) 10/12/2022   Schizophrenia (HCC)    Seizure (HCC)    Slurred speech 01/04/2020     PAST SURGICAL HISTORY: Past Surgical History:  Procedure Laterality Date   LUNG BIOPSY Left    09/2022  under brochoscopy   None       FAMILY HISTORY: Family History  Problem Relation Age of Onset   Breast cancer Mother    Diverticulitis Mother        had surgery and colostomy bag 11/2015   Colon cancer Paternal Grandfather    Pancreatic cancer Maternal Grandmother      SOCIAL HISTORY: Social History   Socioeconomic History   Marital status: Single     Spouse name: Not on file   Number of children: 0   Years of education: 8   Highest education level: Not on file  Occupational History   Occupation: Disabled   Tobacco Use   Smoking status: Every Day    Current packs/day: 1.00    Average packs/day: 1 pack/day for 29.0 years (29.0 ttl pk-yrs)    Types: Cigarettes   Smokeless tobacco: Never   Tobacco comments:    He has cut back on cigarette  smoking  Substance and Sexual Activity   Alcohol use: Yes    Comment: sometimes    Drug use: No    Comment: Denies   Sexual activity: Not on file  Other Topics Concern   Not on file  Social History Narrative   Patient lives alone.    Patient is on disability.   Patient is single.    Caffeine use: coffee    Social Drivers of Corporate investment banker Strain: Not on file  Food Insecurity: Not on file  Transportation Needs: Not on file  Physical Activity: Not on file  Stress: Not on file  Social Connections: Not on file  Intimate Partner Violence: Not on file      PHYSICAL EXAM  There were no vitals filed for this visit.    There is no height or weight on file to calculate BMI.   Generalized: Well developed, in no acute distress  Cardiology: normal rate and rhythm, no murmur auscultated  Respiratory: clear to auscultation bilaterally    Neurological examination  Mentation: Alert oriented to time, place, history taking. Follows all commands speech and language fluent Cranial nerve II-XII: Pupils were equal round reactive to light. Extraocular movements were full, visual field were full on confrontational test. Facial sensation and strength were normal. Head turning and shoulder shrug  were normal and symmetric. Motor: The motor testing reveals 5 over 5 strength of all 4 extremities. Good symmetric motor tone is noted throughout.  Sensory: Sensory testing is intact to soft touch on all 4 extremities. No evidence of extinction is noted.  Coordination: Cerebellar testing  reveals good finger-nose-finger and heel-to-shin bilaterally.  Gait and station: Gait is mildly wide. Stale without assistive device. Tandem very slightly unsteady    Reflexes: Deep tendon reflexes are symmetric and normal bilaterally.     DIAGNOSTIC DATA (LABS, IMAGING, TESTING) - I reviewed patient records, labs, notes, testing and imaging myself where available.  Lab Results  Component Value Date   WBC 6.9 03/30/2024   HGB 15.3 03/30/2024   HCT 43.8 03/30/2024   MCV 96 03/30/2024   PLT 180 03/30/2024      Component Value Date/Time   NA 134 03/30/2024 1456   K 4.4 03/30/2024 1456   CL 96 03/30/2024 1456   CO2 21 03/30/2024 1456   GLUCOSE 99 03/30/2024 1456   GLUCOSE 167 (H) 12/22/2019 1239   BUN 4 (L) 03/30/2024 1456   CREATININE 0.62 (L) 03/30/2024 1456   CALCIUM 10.1 03/30/2024 1456   PROT 7.1 03/30/2024 1456   ALBUMIN 4.7 03/30/2024 1456   AST 16 03/30/2024 1456   ALT 13 03/30/2024 1456   ALKPHOS 109 03/30/2024 1456   BILITOT 0.5 03/30/2024 1456   GFRNONAA 111 06/11/2020 1544   GFRAA 129 06/11/2020 1544   Lab Results  Component Value Date   CHOL 177 03/30/2024   HDL 49 03/30/2024   LDLCALC 114 (H) 03/30/2024   TRIG 76 03/30/2024   CHOLHDL 3.6 03/30/2024   Lab Results  Component Value Date   HGBA1C 5.1 09/24/2022   Lab Results  Component Value Date   VITAMINB12 573 12/10/2020   Lab Results  Component Value Date   TSH 1.510 09/24/2022        No data to display               No data to display           ASSESSMENT AND PLAN  48 y.o. year old male  has a past medical history of AMS (altered mental status) (12/19/2019), Carpal tunnel syndrome on left, MS (multiple sclerosis), Necrotizing pneumonia (HCC) (10/12/2022), Schizophrenia (HCC), Seizure (HCC), and Slurred speech (01/04/2020). here with   No diagnosis found.  Antar is doing well from an MS standpoint. We will continue Tecfidera  (Dimethyl Fumarate ). I will update labs today. He  will continue lamotrigine  100mg  BID for seizure management. Hee will continue divalproex  ER, clonazepam  and risperidone  as prescribed by psychiatry. He is aware to monitor for worsening tremor or mood. He was encouraged to stay well hydrated. Seizure precautions reviewed. He does not drive. Medications are put in pill container by mom. He will follow up with us  in 6 months.    No orders of the defined types were placed in this encounter.    No orders of the defined types were placed in this encounter.    Greig Forbes, MSN, FNP-C 06/06/2024, 8:06 AM  [facility] [address] [place], [place] [address] [phone]

## 2024-06-06 NOTE — Patient Instructions (Signed)

## 2024-06-07 ENCOUNTER — Encounter: Payer: Self-pay | Admitting: Family Medicine

## 2024-06-07 ENCOUNTER — Ambulatory Visit (INDEPENDENT_AMBULATORY_CARE_PROVIDER_SITE_OTHER): Payer: MEDICAID | Admitting: Family Medicine

## 2024-06-07 VITALS — BP 124/84 | HR 73 | Ht 69.0 in | Wt 132.8 lb

## 2024-06-07 DIAGNOSIS — G40309 Generalized idiopathic epilepsy and epileptic syndromes, not intractable, without status epilepticus: Secondary | ICD-10-CM

## 2024-06-07 DIAGNOSIS — F209 Schizophrenia, unspecified: Secondary | ICD-10-CM | POA: Diagnosis not present

## 2024-06-07 DIAGNOSIS — Z79899 Other long term (current) drug therapy: Secondary | ICD-10-CM | POA: Diagnosis not present

## 2024-06-07 DIAGNOSIS — G35D Multiple sclerosis, unspecified: Secondary | ICD-10-CM

## 2024-06-07 DIAGNOSIS — G35 Multiple sclerosis: Secondary | ICD-10-CM | POA: Diagnosis not present

## 2024-06-07 MED ORDER — LAMOTRIGINE 100 MG PO TABS: 100.0000 mg | ORAL_TABLET | Freq: Two times a day (BID) | ORAL | 3 refills | Status: AC

## 2024-06-08 ENCOUNTER — Ambulatory Visit: Payer: Self-pay | Admitting: Family Medicine

## 2024-06-08 LAB — CBC WITH DIFFERENTIAL/PLATELET
Basophils Absolute: 0.1 x10E3/uL (ref 0.0–0.2)
Basos: 1 %
EOS (ABSOLUTE): 0.2 x10E3/uL (ref 0.0–0.4)
Eos: 2 %
Hematocrit: 40.8 % (ref 37.5–51.0)
Hemoglobin: 13.8 g/dL (ref 13.0–17.7)
Immature Grans (Abs): 0.1 x10E3/uL (ref 0.0–0.1)
Immature Granulocytes: 1 %
Lymphocytes Absolute: 2.9 x10E3/uL (ref 0.7–3.1)
Lymphs: 32 %
MCH: 33.3 pg — ABNORMAL HIGH (ref 26.6–33.0)
MCHC: 33.8 g/dL (ref 31.5–35.7)
MCV: 98 fL — ABNORMAL HIGH (ref 79–97)
Monocytes Absolute: 0.6 x10E3/uL (ref 0.1–0.9)
Monocytes: 7 %
Neutrophils Absolute: 5.1 x10E3/uL (ref 1.4–7.0)
Neutrophils: 57 %
Platelets: 204 x10E3/uL (ref 150–450)
RBC: 4.15 x10E6/uL (ref 4.14–5.80)
RDW: 12.5 % (ref 11.6–15.4)
WBC: 8.9 x10E3/uL (ref 3.4–10.8)

## 2024-06-30 ENCOUNTER — Other Ambulatory Visit: Payer: Self-pay | Admitting: *Deleted

## 2024-06-30 DIAGNOSIS — G35D Multiple sclerosis, unspecified: Secondary | ICD-10-CM

## 2024-06-30 MED ORDER — DIMETHYL FUMARATE 240 MG PO CPDR: 1.0000 | DELAYED_RELEASE_CAPSULE | Freq: Two times a day (BID) | ORAL | 5 refills | Status: AC

## 2024-06-30 NOTE — Telephone Encounter (Signed)
 Last seen on 06/07/24 Follow up scheduled on 12/29/23

## 2024-08-29 ENCOUNTER — Other Ambulatory Visit: Payer: Self-pay | Admitting: Nurse Practitioner

## 2024-09-22 ENCOUNTER — Telehealth: Payer: Self-pay | Admitting: Family Medicine

## 2024-09-22 NOTE — Telephone Encounter (Signed)
 Pt's mother reports she is aware pt has not consistently taken the Dimethyl Fumarate  240 MG CPDR , today pt is very lethargic. Pt's mother does not feel he is in any danger but all the same very lethargic, she is wanting to know if she should take pt to ED

## 2024-09-22 NOTE — Telephone Encounter (Addendum)
 I called the patient's mother back.  We had a 25-minute conversation.  She reports that she takes the patient medication to his home in containers and labels them for about 3 weeks at a time.  The patient has been doing well taking his medications.  About a month ago he went to urgent care for a fungal rash of his groin and was given antibiotic and cream and it resolved.  On January 5 patient's mother went to visit him and took his meds.  She was unable to return for 1.5 weeks due to car trouble.  Yesterday she  went to visit him and noticed his house was not neat like it usually is and he came out of the home talking weird like he does when he misses his medications for schizophrenia and was speaking disrespectfully to her.  She does not know if he has had a seizure.  She states he is usually secretive about this.  She noticed that he had mixed up his medications and had probably missed several days of all of his meds.  She found out that he was buying synthetic marijuana at vape stores.  She thinks is unlikely that he has any more but it is possible that a friend could bring it to him.  She does understand that it is important for the patient to be compliant with all of his medications as even one dose missed can cause a seizure and he can have other issues with missing medications for MS and schizophrenia as well. She understands that drugs can affect his medications and should not be taken. He was able to take his medications twice yesterday and once today.  She visited him again today but she noticed he was lethargic, took forever to get dressed and had trouble finding his clothes.  He was dropping things when they were out to eat at [employer]. He spilled coffee on the floor. He put his head down in the car and was lethargic. His house was a mess. His phone will not charge so he cannot be reached.  She said he was acting today like he did the first time he got diagnosed with MS.  I told her that it is  difficult to say exactly what is going on but I do feel like given his decline and the medications that he has missed and possible drugs in his system that I recommend he go to the emergency department as soon as possible. She is unable to go back to him today but she did say she could try to see if her neighbor would go over and check on him.  She does not want to call 911 and have them show up to his house when she is not there for fear of causing anxiety.  She thanked me for the call.  I told her I would let the providers know.   She said the soonest she can get down there to get him to a hospital is tomorrow morning.

## 2024-09-22 NOTE — Telephone Encounter (Signed)
 Spoke with pt's mother and relayed message from Dr Vear. She said a neighbor just went and checked on him and said he was ok and coherent and was playing his guitar and acting normal. His mother will go and see him tomorrow morning. She knows she is welcome to call back if needed and she can speak to on-call provider. She thanked me for the call.

## 2024-10-03 ENCOUNTER — Encounter: Payer: MEDICAID | Admitting: Nurse Practitioner

## 2024-10-05 ENCOUNTER — Encounter: Payer: Self-pay | Admitting: Nurse Practitioner

## 2024-10-05 NOTE — Progress Notes (Unsigned)
 LILLETTE Kristeen JINNY Gladis, CMA,acting as a neurosurgeon for Gaines Ada, FNP.,have documented all relevant documentation on the behalf of Gaines Ada, FNP,as directed by  Gaines Ada, FNP while in the presence of Gaines Ada, FNP.  Subjective:   Patient ID: Micheal Webb , male    DOB: [birth year] , 49 y.o.   MRN: [id number]  No chief complaint on file.   HPI  HPI   Past Medical History:  Diagnosis Date   AMS (altered mental status) 12/19/2019   Carpal tunnel syndrome on left    MS (multiple sclerosis)    Necrotizing pneumonia (HCC) 10/12/2022   Schizophrenia (HCC)    Seizure (HCC)    Slurred speech 01/04/2020     Family History  Problem Relation Age of Onset   Breast cancer Mother    Diverticulitis Mother        had surgery and colostomy bag 11/2015   Colon cancer Paternal Grandfather    Pancreatic cancer Maternal Grandmother     Current Medications[1]   Allergies[2]   Men's preventive visit. Patient Health Questionnaire (PHQ-2) is  Flowsheet Row Office Visit from 03/30/2024 in [facility]  PHQ-2 Total Score 0  . Patient is on a *** diet. Marital status: Single. Relevant history for alcohol use is:  Social History   Substance and Sexual Activity  Alcohol Use Yes   Comment: sometimes   . Relevant history for tobacco use is: Tobacco Use History[3].   Review of Systems   There were no vitals filed for this visit. There is no height or weight on file to calculate BMI.  Wt Readings from Last 3 Encounters:  06/07/24 132 lb 12.8 oz (60.2 kg)  03/30/24 130 lb (59 kg)  09/30/23 136 lb 12.8 oz (62.1 kg)    Objective:  Physical Exam      Assessment And Plan:    Encounter for annual health examination  Mixed hyperlipidemia  Schizophrenia, unspecified type (HCC)  Vitamin D  deficiency     No follow-ups on file. Patient was given opportunity to ask questions. Patient verbalized understanding of the plan and was able to repeat key  elements of the plan. All questions were answered to their satisfaction.   Gaines Ada, FNP  I, Gaines Ada, FNP, have reviewed all documentation for this visit. The documentation on 10/05/24 for the exam, diagnosis, procedures, and orders are all accurate and complete.     [1]  Current Outpatient Medications:    albuterol  (VENTOLIN  HFA) 108 (90 Base) MCG/ACT inhaler, Inhale 2 puffs into the lungs every 6 (six) hours as needed for wheezing or shortness of breath., Disp: , Rfl:    benztropine  (COGENTIN ) 1 MG tablet, Take 1 mg by mouth See admin instructions. 1 tablet in the morning and 2 tablets at bedtime., Disp: , Rfl:    budesonide -formoterol  (SYMBICORT ) 160-4.5 MCG/ACT inhaler, Inhale 2 puffs into the lungs in the morning and at bedtime., Disp: , Rfl:    Cholecalciferol  (VITAMIN D ) 125 MCG (5000 UT) CAPS, Take 125 mcg by mouth daily. , Disp: , Rfl:    clonazePAM  (KLONOPIN ) 1 MG tablet, Take 1 mg by mouth in the morning and at bedtime. , Disp: , Rfl:    Dimethyl Fumarate  240 MG CPDR, Take 1 capsule (240 mg total) by mouth 2 (two) times daily. One po bid with food, Disp: 60 capsule, Rfl: 5   divalproex  (DEPAKOTE  ER) 500 MG 24 hr tablet, 1,500 mg at bedtime. , Disp: , Rfl: 5  lamoTRIgine  (LAMICTAL ) 100 MG tablet, Take 1 tablet (100 mg total) by mouth 2 (two) times daily., Disp: 180 tablet, Rfl: 3   meloxicam  (MOBIC ) 15 MG tablet, TAKE ONE TABLET BY MOUTH DAILY, Disp: 90 tablet, Rfl: 11   pravastatin  (PRAVACHOL ) 10 MG tablet, TAKE ONE TABLET BY MOUTH AT BEDTIME, Disp: 90 tablet, Rfl: 7   risperiDONE  (RISPERDAL ) 2 MG tablet, Take 2 mg by mouth 2 (two) times daily. , Disp: , Rfl:  [2] No Known Allergies [3]  Social History Tobacco Use  Smoking Status Every Day   Current packs/day: 1.00   Average packs/day: 1 pack/day for 29.0 years (29.0 ttl pk-yrs)   Types: Cigarettes  Smokeless Tobacco Never  Tobacco Comments   He has cut back on cigarette smoking

## 2024-10-17 ENCOUNTER — Encounter: Payer: Self-pay | Admitting: Nurse Practitioner

## 2024-12-28 ENCOUNTER — Ambulatory Visit: Payer: MEDICAID | Admitting: Neurology
